# Patient Record
Sex: Female | Born: 1980 | Race: White | Hispanic: Yes | Smoking: Never smoker
Health system: Southern US, Community
[De-identification: ages and names within clinical notes are randomized; demographics above are authoritative.]

## PROBLEM LIST (undated history)

## (undated) DIAGNOSIS — O139 Gestational [pregnancy-induced] hypertension without significant proteinuria, unspecified trimester: Secondary | ICD-10-CM

## (undated) DIAGNOSIS — F32A Depression, unspecified: Secondary | ICD-10-CM

## (undated) DIAGNOSIS — I499 Cardiac arrhythmia, unspecified: Secondary | ICD-10-CM

## (undated) DIAGNOSIS — N390 Urinary tract infection, site not specified: Secondary | ICD-10-CM

## (undated) HISTORY — PX: NO PAST SURGERIES: SHX2092

## (undated) HISTORY — DX: Cardiac arrhythmia, unspecified: I49.9

---

## 2000-02-06 ENCOUNTER — Encounter: Payer: Self-pay | Admitting: Emergency Medicine

## 2000-02-06 ENCOUNTER — Emergency Department (HOSPITAL_COMMUNITY): Admission: EM | Admit: 2000-02-06 | Discharge: 2000-02-06 | Payer: Self-pay | Admitting: Emergency Medicine

## 2004-07-12 ENCOUNTER — Inpatient Hospital Stay (HOSPITAL_COMMUNITY): Admission: AD | Admit: 2004-07-12 | Discharge: 2004-07-12 | Payer: Self-pay | Admitting: Obstetrics

## 2004-10-09 ENCOUNTER — Inpatient Hospital Stay (HOSPITAL_COMMUNITY): Admission: AD | Admit: 2004-10-09 | Discharge: 2004-10-09 | Payer: Self-pay | Admitting: Obstetrics & Gynecology

## 2004-10-09 ENCOUNTER — Inpatient Hospital Stay (HOSPITAL_COMMUNITY): Admission: AD | Admit: 2004-10-09 | Discharge: 2004-10-11 | Payer: Self-pay | Admitting: Obstetrics & Gynecology

## 2006-04-27 ENCOUNTER — Ambulatory Visit: Payer: Self-pay | Admitting: Family Medicine

## 2006-04-29 ENCOUNTER — Ambulatory Visit: Payer: Self-pay | Admitting: *Deleted

## 2006-08-19 ENCOUNTER — Ambulatory Visit: Payer: Self-pay | Admitting: Family Medicine

## 2006-09-07 ENCOUNTER — Ambulatory Visit: Payer: Self-pay | Admitting: Family Medicine

## 2006-10-21 ENCOUNTER — Ambulatory Visit: Payer: Self-pay | Admitting: Internal Medicine

## 2006-11-04 ENCOUNTER — Ambulatory Visit: Payer: Self-pay | Admitting: Internal Medicine

## 2006-12-04 ENCOUNTER — Observation Stay (HOSPITAL_COMMUNITY): Admission: EM | Admit: 2006-12-04 | Discharge: 2006-12-06 | Payer: Self-pay | Admitting: Emergency Medicine

## 2006-12-06 ENCOUNTER — Ambulatory Visit: Payer: Self-pay | Admitting: Internal Medicine

## 2007-01-05 ENCOUNTER — Ambulatory Visit: Payer: Self-pay | Admitting: Internal Medicine

## 2007-04-20 ENCOUNTER — Ambulatory Visit: Payer: Self-pay | Admitting: Internal Medicine

## 2007-06-28 ENCOUNTER — Encounter (INDEPENDENT_AMBULATORY_CARE_PROVIDER_SITE_OTHER): Payer: Self-pay | Admitting: *Deleted

## 2007-08-03 ENCOUNTER — Inpatient Hospital Stay (HOSPITAL_COMMUNITY): Admission: AD | Admit: 2007-08-03 | Discharge: 2007-08-03 | Payer: Self-pay | Admitting: Family Medicine

## 2007-11-21 ENCOUNTER — Ambulatory Visit (HOSPITAL_COMMUNITY): Admission: RE | Admit: 2007-11-21 | Discharge: 2007-11-21 | Payer: Self-pay | Admitting: Family Medicine

## 2008-03-15 ENCOUNTER — Ambulatory Visit: Payer: Self-pay | Admitting: Obstetrics and Gynecology

## 2008-03-18 ENCOUNTER — Ambulatory Visit: Payer: Self-pay | Admitting: Obstetrics and Gynecology

## 2008-03-18 ENCOUNTER — Inpatient Hospital Stay (HOSPITAL_COMMUNITY): Admission: AD | Admit: 2008-03-18 | Discharge: 2008-03-20 | Payer: Self-pay | Admitting: Gynecology

## 2008-04-26 ENCOUNTER — Inpatient Hospital Stay (HOSPITAL_COMMUNITY): Admission: AD | Admit: 2008-04-26 | Discharge: 2008-04-26 | Payer: Self-pay | Admitting: Obstetrics & Gynecology

## 2011-02-26 NOTE — H&P (Signed)
NAMESHIFRA, SWARTZENTRUBER NO.:  [id number]   MEDICAL RECORD NO.:  [id number]          PATIENT TYPE:  EMS   LOCATION:  ED                           FACILITY:  [facility]   PHYSICIAN:  Madaline Savage, MD        DATE OF BIRTH:  1981/07/19   DATE OF PROCEDURE:  DATE OF DISCHARGE:                    STAT - MUST CHANGE TO CORRECT WORK TYPE   PRIMARY CARE PHYSICIAN:  Unassigned to [place].   CHIEF COMPLAINT:  Zoloft overdose.   HISTORY OF PRESENT ILLNESS:  Ms. Doy Mince is a 30 year old Spanish-speaking  lady who was brought to the ER after she took 15-20 tablets of 100 mg  Zoloft.  She was no speaking [place], and the history was obtained using  an interpreter phone.  She states that she has a history of depression,  and she was feeling very depressed and edgy today, and so she took  Zoloft tablet.  Since she was not feeling better after a couple of  minutes, she took another tablet.  This continued for awhile, and she  says in about 40 minutes she took 15-20 tablets of 100 mg of Zoloft.  She did not have any other complaints.  She denies being suicidal or  homicidal   When she came to the ER, her vital signs were stable, but at this point  in time, her heart rate has gone up to 110-120.  She also complains of  vague abdominal pain which started after coming to the ER.  She cannot  locate it to one place in the abdomen.  She denies having any nausea or  change in bowel habits.  She also denies chest pain, palpitations, or  breathing difficulty.   PAST MEDICAL HISTORY:  Depression. She does go to the psych doctor at  the health service.  No other medical problems.   PAST SURGICAL HISTORY:  None.   ALLERGIES:  None.   CURRENT MEDICATIONS:  Zoloft 100 mg once daily.   SOCIAL HISTORY:  She has two kids, ages 101 and 2.  She denies any  smoking, alcohol, or drug abuse.   FAMILY HISTORY:  Her father passed away.  He was a HIV positive.  She  states her mother is 32.  She is also HIV  positive, and she also has  diabetes.   REVIEW OF SYSTEMS:  GENERAL:  She denies any recent weight loss, weight  gain, fever, or chills.  HEENT: No headaches, no blurred vision.  CARDIOVASCULAR;  Denies chest pain, palpitations.  RESPIRATORY SYSTEM:  No shortness of breath or cough.  GI: Does complain of vague abdominal  pain but nausea, vomiting, diarrhea, or constipation.   PHYSICAL EXAMINATION:  GENERAL:  Alert and oriented x3.  VITAL SIGNS: Temperature 98.4, pulse rate of 83, blood pressure 118/43,  oxygen saturation 99% on room air.  HEENT:  Head is atraumatic and normocephalic.  Pupils equal, round, and  reactive to light.  Mucous membranes moist.  NECK:  Supple.  No JVD, no carotid bruit.  CARDIOVASCULAR:  S1, S2 heard.  Regular rate and rhythm.  No S3,  gallops.  CHEST:  Clear to auscultation.  ABDOMEN:  Soft.  Bowel sounds heard.  EXTREMITIES:  No edema, cyanosis, clubbing.   LABORATORY DATA:  Sodium 139, potassium 3.3, chloride 110, bicarb 24,  BUN 10, creatinine 0.68, glucose 127 . Acetaminophen level less than 10.  Salicylate level less than 4.  Urine drug screen is negative.   EKG shows sinus tachycardia at 120.   PROBLEM LIST:  1. Zoloft overdose.  2. Depression.  3. Tachycardia.  4. Hypokalemia.   PLAN:  We will admit her at 23-observation to a telemetry floor.  Tachycardia is one of the effects of a Zoloft overdose, but her heart  rate was low when she came.  We will watch her heart rate at this point  in time.  Her blood pressure is stable.  We will also replace the  potassium and will consult the psychiatrist on call in the morning.      Madaline Savage, MD  Electronically Signed     PKN/MEDQ  D:  12/04/2006  T:  12/04/2006  Job:  [id number]

## 2011-02-26 NOTE — Consult Note (Signed)
NAMESHIRLEYANN, MONTERO NO.:  [id number]   MEDICAL RECORD NO.:  [id number]          PATIENT TYPE:  OBV   LOCATION:  1424                         FACILITY:  [facility]   PHYSICIAN:  Antonietta Breach, M.D.  DATE OF BIRTH:  09/19/1981   DATE OF CONSULTATION:  12/05/2006  DATE OF DISCHARGE:  12/06/2006                                 CONSULTATION   REQUESTING PHYSICIAN:  Hettie Holstein, D.O.   REASON FOR CONSULTATION:  Overdose.   HISTORY:  Natalie Aguilar is a 30 year old female admitted to the  [facility] after an overdose of Zoloft.  She is Spanish-  speaking, and the interpreter phone system is utilized.  Approximately  three months ago she was experiencing depressed mood, decreased energy,  difficulty concentrating, as well as feeling on edge and excess worry.  She went to her primary care physician at the [facility], and  received a prescription of Zoloft for 50 mg daily.  She began to take  that, and over a number of weeks had a resolution of a number of her  symptoms.  When she went back for followup she stated that there was a  new physician; and her Zoloft medicine was increased to 100 mg daily.  She then began to have feelings of dizziness; and she took herself off  of the Zoloft.   Over the past week 1-1/2 weeks she redeveloped feeling on edge and  irritability as well as some depressed mood and excessive worry.  She  started to take her Zoloft, again, on the date of admission.  However,  she impulsively began to take more of it, hoping that it would acutely  reduce her symptoms.  It is estimated that she took 15-20 tablets of the  Zoloft; she is adamantly denied that she was trying to kill herself.  She continues with the same story that she was trying to reduce her  acute symptoms.  She states that when she called emergency services she  was not having any thoughts of killing herself nor was she having  thoughts of harming her kids.  She  did state that if she did not get  relief of her symptoms that she could and harming her children.   While on the medical floor Natalie Aguilar has been socially appropriate and  cooperative.  She is oriented to all spheres.  Her memory is intact.  She discusses normal interests and hope.  She does not have any thoughts  of harming herself or others.  She does not have any hallucinations or  delusions.   PAST PSYCHIATRIC HISTORY:  There is no history of mania, no history of  suicide attempts, no history of psychiatric hospitalizations, no prior  history of psychotropic medication.   FAMILY PSYCHIATRIC HISTORY:  None known.   SOCIAL HISTORY:  She is married.  She has two children ages 20 and 2.  She denies any alcohol or illegal drug use.   GENERAL MEDICAL PROBLEMS:  Status post Zoloft overdose.   MEDICATIONS:  No psychotropics.   ALLERGIES:  She has no known  drug allergies.   MENTAL STATUS EXAM:  Natalie Aguilar is an alert female who appears her  chronologic age of 30.  She displays a normal psychomotor tone.  She is  sitting up in her hospital bed with good eye contact and socially  appropriate.  She is oriented to all spheres.  Her speech is clearly  understood by the interpreter, and is coherent without dysarthria.  The  prosody is normal.  Thought process:  Logical, coherent, and goal-  directed, no looseness of associations.  Thought content:  No thoughts  of harming herself, no thoughts of harming others, no delusions, no  hallucinations.  Concentration is grossly within normal limits.  Her  insight appears to be fairly good.  Judgment is intact.  Fund of  knowledge and intelligence are estimated to be within normal limits.   ASSESSMENT:  AXIS I:  1. 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  2. 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  AXIS  II:  Deferred.  AXIS III:  See general medical problems.  AXIS IV:  Primary support group.  AXIS V:  55.   Natalie Aguilar was assessed to not be at risk to harm herself or others.  She agrees to use emergency services immediately for any thoughts of  harming herself, thoughts of harming others or distress.   RECOMMENDATIONS:  1. She will continue on Zoloft 50 mg daily for antidepression      antianxiety.  She has a follow up with her primary care physician.  2. Also would ask the case manager to expedite [facility]      [facility] psychiatric      consultation for her psychotropic medication as well as counseling.      Would ensure that she has a follow up appointment within the first      week of discharge.  3. The undersigned provided education on the treatment of anxiety and      depression.      Antonietta Breach, M.D.  Electronically Signed     JW/MEDQ  D:  12/06/2006  T:  12/06/2006  Job:  [id number]

## 2011-07-08 LAB — CBC
Hemoglobin: 8.3 — ABNORMAL LOW
MCV: 72.7 — ABNORMAL LOW
Platelets: 182
Platelets: 184
RDW: 15.7 — ABNORMAL HIGH
WBC: 11.3 — ABNORMAL HIGH
WBC: 9.3

## 2011-07-08 LAB — RPR: RPR Ser Ql: NONREACTIVE

## 2011-07-21 LAB — URINALYSIS, ROUTINE W REFLEX MICROSCOPIC
Hgb urine dipstick: NEGATIVE
Nitrite: NEGATIVE
Specific Gravity, Urine: 1.025
Urobilinogen, UA: 0.2
pH: 6.5

## 2011-07-21 LAB — POCT PREGNANCY, URINE
Operator id: 120561
Preg Test, Ur: POSITIVE

## 2011-07-27 ENCOUNTER — Emergency Department (HOSPITAL_COMMUNITY)
Admission: EM | Admit: 2011-07-27 | Discharge: 2011-07-27 | Disposition: A | Discharge status: Home / Self Care | Payer: Self-pay | Attending: Emergency Medicine | Admitting: Emergency Medicine

## 2011-07-27 DIAGNOSIS — N390 Urinary tract infection, site not specified: Secondary | ICD-10-CM | POA: Insufficient documentation

## 2011-07-27 DIAGNOSIS — N949 Unspecified condition associated with female genital organs and menstrual cycle: Secondary | ICD-10-CM | POA: Insufficient documentation

## 2011-07-27 DIAGNOSIS — N898 Other specified noninflammatory disorders of vagina: Secondary | ICD-10-CM | POA: Insufficient documentation

## 2011-07-27 DIAGNOSIS — R300 Dysuria: Secondary | ICD-10-CM | POA: Insufficient documentation

## 2011-07-27 LAB — URINE MICROSCOPIC-ADD ON

## 2011-07-27 LAB — POCT PREGNANCY, URINE: Preg Test, Ur: NEGATIVE

## 2011-07-27 LAB — URINALYSIS, ROUTINE W REFLEX MICROSCOPIC
Nitrite: NEGATIVE
Specific Gravity, Urine: 1.021 (ref 1.005–1.030)
pH: 7.5 (ref 5.0–8.0)

## 2011-07-28 LAB — GC/CHLAMYDIA PROBE AMP, GENITAL
Chlamydia, DNA Probe: NEGATIVE
GC Probe Amp, Genital: NEGATIVE

## 2013-07-24 ENCOUNTER — Encounter: Payer: Self-pay | Admitting: Internal Medicine

## 2013-07-24 ENCOUNTER — Ambulatory Visit: Payer: Self-pay | Attending: Internal Medicine | Admitting: Internal Medicine

## 2013-07-24 VITALS — BP 104/67 | HR 64 | Temp 99.2°F | Resp 16 | Ht 67.0 in | Wt 150.0 lb

## 2013-07-24 DIAGNOSIS — N390 Urinary tract infection, site not specified: Secondary | ICD-10-CM | POA: Insufficient documentation

## 2013-07-24 DIAGNOSIS — B3749 Other urogenital candidiasis: Secondary | ICD-10-CM

## 2013-07-24 DIAGNOSIS — Z0000 Encounter for general adult medical examination without abnormal findings: Secondary | ICD-10-CM

## 2013-07-24 LAB — POCT URINALYSIS DIPSTICK
Blood, UA: NEGATIVE
Glucose, UA: NEGATIVE
Nitrite, UA: NEGATIVE
Urobilinogen, UA: 0.2

## 2013-07-24 MED ORDER — METRONIDAZOLE 500 MG PO TABS: 500.0000 mg | ORAL_TABLET | Freq: Three times a day (TID) | ORAL | Status: DC

## 2013-07-24 MED ORDER — DOXYCYCLINE HYCLATE 100 MG PO TABS: 100.0000 mg | ORAL_TABLET | Freq: Two times a day (BID) | ORAL | Status: DC

## 2013-07-24 MED ORDER — SIMETHICONE 80 MG PO CHEW: 80.0000 mg | CHEWABLE_TABLET | Freq: Four times a day (QID) | ORAL | Status: DC | PRN

## 2013-07-24 NOTE — Progress Notes (Signed)
CC: New patient  HPI: 32 year old female with no significant past medical history who presented to clinic with complaints of burning sensation on urination for past couple of days, and frequency but no hematuria. Patient reports no fevers or chills. Patient reports this usually comes few days after sexual intercourse. She's using condoms with her partner. Patient also describes having whitish discharge. She used over-the-counter gel for yeast infection but with no significant improvement. No complaints of abdominal pain, nausea or vomiting.  No Known Allergies History reviewed. No pertinent past medical history. No current outpatient prescriptions on file prior to visit.   No current facility-administered medications on file prior to visit.   Family History  Problem Relation Age of Onset  . Diabetes Mother    History   Social History  . Marital Status: Single    Spouse Name: N/A    Number of Children: N/A  . Years of Education: N/A   Occupational History  . Not on file.   Social History Main Topics  . Smoking status: Never Smoker   . Smokeless tobacco: Not on file  . Alcohol Use: No  . Drug Use: No  . Sexual Activity: Not on file   Other Topics Concern  . Not on file   Social History Narrative  . No narrative on file    Review of Systems  Constitutional: Negative for fever, chills, diaphoresis, activity change, appetite change and fatigue.  HENT: Negative for ear pain, nosebleeds, congestion, facial swelling, rhinorrhea, neck pain, neck stiffness and ear discharge.   Eyes: Negative for pain, discharge, redness, itching and visual disturbance.  Respiratory: Negative for cough, choking, chest tightness, shortness of breath, wheezing and stridor.   Cardiovascular: Negative for chest pain, palpitations and leg swelling.  Gastrointestinal: Negative for abdominal distention.  Genitourinary: Per history of present illness Musculoskeletal: Negative for back pain, joint  swelling, arthralgias and gait problem.  Neurological: Negative for dizziness, tremors, seizures, syncope, facial asymmetry, speech difficulty, weakness, light-headedness, numbness and headaches.  Hematological: Negative for adenopathy. Does not bruise/bleed easily.  Psychiatric/Behavioral: Negative for hallucinations, behavioral problems, confusion, dysphoric mood, decreased concentration and agitation.    Objective:   Filed Vitals:   07/24/13 0931  BP: 104/67  Pulse: 64  Temp: 99.2 F (37.3 C)  Resp: 16    Physical Exam  Constitutional: Appears well-developed and well-nourished. No distress.  HENT: Normocephalic. External right and left ear normal. Oropharynx is clear and moist.  Eyes: Conjunctivae and EOM are normal. PERRLA, no scleral icterus.  Neck: Normal ROM. Neck supple. No JVD. No tracheal deviation. No thyromegaly.  CVS: RRR, S1/S2 +, no murmurs, no gallops, no carotid bruit.  Pulmonary: Effort and breath sounds normal, no stridor, rhonchi, wheezes, rales.  Abdominal: Soft. BS +,  no distension, tenderness, rebound or guarding.  Musculoskeletal: Normal range of motion. No edema and no tenderness.  Lymphadenopathy: No lymphadenopathy noted, cervical, inguinal. Neuro: Alert. Normal reflexes, muscle tone coordination. No cranial nerve deficit. Skin: Skin is warm and dry. No rash noted. Not diaphoretic. No erythema. No pallor.  Psychiatric: Normal mood and affect. Behavior, judgment, thought content normal.   Lab Results  Component Value Date   WBC 11.3* 03/19/2008   HGB 8.3 DELTA CHECK NOTED* 03/19/2008   HCT 24.4* 03/19/2008   MCV 73.2* 03/19/2008   PLT 182 03/19/2008   No results found for this basename: CREATININE, BUN, NA, K, CL, CO2    No results found for this basename: HGBA1C   Lipid Panel  No  results found for this basename: chol, trig, hdl, cholhdl, vldl, ldlcalc       Assessment and plan:   Patient Active Problem List   Diagnosis Date Noted  . UTI (urinary  tract infection) 07/24/2013    Priority: High - Check urinalysis  - Prescription provided for Flagyl for 7 days and doxycycline for 7 days   . Preventative health care 07/24/2013    Priority: Medium - Referral to GYN provided

## 2013-07-24 NOTE — Progress Notes (Signed)
Pt is here to establish care. Pt reports having vaginitis since January. She states that she had a dark yellow discharge she took some over the counter medication and the discharge became white and milky. She said that during sex her vagina get really red. She also states that after meals she has a lot of gas.

## 2013-07-24 NOTE — Patient Instructions (Signed)
Urinary Tract Infection  Urinary tract infections (UTIs) can develop anywhere along your urinary tract. Your urinary tract is your body's drainage system for removing wastes and extra water. Your urinary tract includes two kidneys, two ureters, a bladder, and a urethra. Your kidneys are a pair of bean-shaped organs. Each kidney is about the size of your fist. They are located below your ribs, one on each side of your spine.  CAUSES  Infections are caused by microbes, which are microscopic organisms, including fungi, viruses, and bacteria. These organisms are so small that they can only be seen through a microscope. Bacteria are the microbes that most commonly cause UTIs.  SYMPTOMS   Symptoms of UTIs may vary by age and gender of the patient and by the location of the infection. Symptoms in young women typically include a frequent and intense urge to urinate and a painful, burning feeling in the bladder or urethra during urination. Older women and men are more likely to be tired, shaky, and weak and have muscle aches and abdominal pain. A fever may mean the infection is in your kidneys. Other symptoms of a kidney infection include pain in your back or sides below the ribs, nausea, and vomiting.  DIAGNOSIS  To diagnose a UTI, your caregiver will ask you about your symptoms. Your caregiver also will ask to provide a urine sample. The urine sample will be tested for bacteria and white blood cells. White blood cells are made by your body to help fight infection.  TREATMENT   Typically, UTIs can be treated with medication. Because most UTIs are caused by a bacterial infection, they usually can be treated with the use of antibiotics. The choice of antibiotic and length of treatment depend on your symptoms and the type of bacteria causing your infection.  HOME CARE INSTRUCTIONS   If you were prescribed antibiotics, take them exactly as your caregiver instructs you. Finish the medication even if you feel better after you  have only taken some of the medication.   Drink enough water and fluids to keep your urine clear or pale yellow.   Avoid caffeine, tea, and carbonated beverages. They tend to irritate your bladder.   Empty your bladder often. Avoid holding urine for long periods of time.   Empty your bladder before and after sexual intercourse.   After a bowel movement, women should cleanse from front to back. Use each tissue only once.  SEEK MEDICAL CARE IF:    You have back pain.   You develop a fever.   Your symptoms do not begin to resolve within 3 days.  SEEK IMMEDIATE MEDICAL CARE IF:    You have severe back pain or lower abdominal pain.   You develop chills.   You have nausea or vomiting.   You have continued burning or discomfort with urination.  MAKE SURE YOU:    Understand these instructions.   Will watch your condition.   Will get help right away if you are not doing well or get worse.  Document Released: 07/07/2005 Document Revised: 03/28/2012 Document Reviewed: 11/05/2011  Exit[employer] Patient Information 2014 [employer].

## 2013-08-24 ENCOUNTER — Ambulatory Visit: Payer: Self-pay

## 2013-11-07 ENCOUNTER — Ambulatory Visit: Payer: Self-pay | Attending: Internal Medicine | Admitting: Internal Medicine

## 2013-11-07 ENCOUNTER — Encounter: Payer: Self-pay | Admitting: Internal Medicine

## 2013-11-07 VITALS — BP 107/68 | HR 64 | Temp 99.2°F | Resp 16 | Ht 67.0 in | Wt 150.0 lb

## 2013-11-07 DIAGNOSIS — N760 Acute vaginitis: Secondary | ICD-10-CM

## 2013-11-07 LAB — CBC WITH DIFFERENTIAL/PLATELET
BASOS ABS: 0 10*3/uL (ref 0.0–0.1)
BASOS PCT: 0 % (ref 0–1)
Eosinophils Absolute: 0.1 10*3/uL (ref 0.0–0.7)
Eosinophils Relative: 2 % (ref 0–5)
HEMATOCRIT: 35.6 % — AB (ref 36.0–46.0)
Hemoglobin: 11.5 g/dL — ABNORMAL LOW (ref 12.0–15.0)
LYMPHS PCT: 33 % (ref 12–46)
Lymphs Abs: 2.6 10*3/uL (ref 0.7–4.0)
MCH: 23.9 pg — ABNORMAL LOW (ref 26.0–34.0)
MCHC: 32.3 g/dL (ref 30.0–36.0)
MCV: 73.9 fL — ABNORMAL LOW (ref 78.0–100.0)
Monocytes Absolute: 0.5 10*3/uL (ref 0.1–1.0)
Monocytes Relative: 6 % (ref 3–12)
NEUTROS ABS: 4.7 10*3/uL (ref 1.7–7.7)
NEUTROS PCT: 59 % (ref 43–77)
Platelets: 246 10*3/uL (ref 150–400)
RBC: 4.82 MIL/uL (ref 3.87–5.11)
RDW: 15.9 % — AB (ref 11.5–15.5)
WBC: 8 10*3/uL (ref 4.0–10.5)

## 2013-11-07 LAB — POCT URINE PREGNANCY: Preg Test, Ur: NEGATIVE

## 2013-11-07 LAB — POCT URINALYSIS DIPSTICK
BILIRUBIN UA: NEGATIVE
Blood, UA: NEGATIVE
Glucose, UA: NEGATIVE
KETONES UA: NEGATIVE
LEUKOCYTES UA: NEGATIVE
NITRITE UA: NEGATIVE
PH UA: 6
PROTEIN UA: NEGATIVE
Spec Grav, UA: >=1.03
Urobilinogen, UA: 0.2

## 2013-11-07 MED ORDER — ACYCLOVIR 400 MG PO TABS: 400.0000 mg | ORAL_TABLET | Freq: Every day | ORAL | Status: DC

## 2013-11-07 MED ORDER — CEFTRIAXONE SODIUM 250 MG IJ SOLR: 250.0000 mg | Freq: Once | INTRAMUSCULAR | Status: DC

## 2013-11-07 MED ORDER — DOXYCYCLINE HYCLATE 100 MG PO TABS: 100.0000 mg | ORAL_TABLET | Freq: Two times a day (BID) | ORAL | Status: DC

## 2013-11-07 MED ORDER — CEFTRIAXONE SODIUM 1 G IJ SOLR
250.0000 mg | Freq: Once | INTRAMUSCULAR | Status: AC
  Administered 2013-11-07: 250 mg via INTRAMUSCULAR

## 2013-11-07 MED ORDER — METRONIDAZOLE 500 MG PO TABS: 500.0000 mg | ORAL_TABLET | Freq: Three times a day (TID) | ORAL | Status: DC

## 2013-11-07 NOTE — Progress Notes (Signed)
Pt is here following up on her UTI  Pt reports that the symptoms has returned. She alos has noticed that she has some new growths around her vagina.

## 2013-11-07 NOTE — Progress Notes (Signed)
Patient ID: Natalie Aguilar, female   DOB: 09/17/1981, 33 y.o.   MRN: [id number]   CC:  HPI: 33 year old female presents with a history of vaginal itching, vaginal redness, bumps in her vagina, recurrent outbreaks of vesicular lesions since December 2013. Symptoms go away when the patient is using barrier protection. Her most recent chlamydia/gonorrhea PCR was negative in 2012. Patient's husband has never had any evaluation for sexually transmitted diseases. She also complains of clear liquid discharge from her vagina. The patient claims that she's not had a Pap smear in several years.   No Known Allergies History reviewed. No pertinent past medical history. Current Outpatient Prescriptions on File Prior to Visit  Medication Sig Dispense Refill  . simethicone (GAS-X) 80 MG chewable tablet Chew 1 tablet (80 mg total) by mouth every 6 (six) hours as needed for flatulence.  30 tablet  0   No current facility-administered medications on file prior to visit.   Family History  Problem Relation Age of Onset  . Diabetes Mother    History   Social History  . Marital Status: Single    Spouse Name: N/A    Number of Children: N/A  . Years of Education: N/A   Occupational History  . Not on file.   Social History Main Topics  . Smoking status: Never Smoker   . Smokeless tobacco: Not on file  . Alcohol Use: No  . Drug Use: No  . Sexual Activity: Not on file   Other Topics Concern  . Not on file   Social History Narrative  . No narrative on file    Review of Systems  Constitutional: Negative for fever, chills, diaphoresis, activity change, appetite change and fatigue.  HENT: Negative for ear pain, nosebleeds, congestion, facial swelling, rhinorrhea, neck pain, neck stiffness and ear discharge.   Eyes: Negative for pain, discharge, redness, itching and visual disturbance.  Respiratory: Negative for cough, choking, chest tightness, shortness of breath, wheezing and stridor.    Cardiovascular: Negative for chest pain, palpitations and leg swelling.  Gastrointestinal: Negative for abdominal distention.  Genitourinary: Positive for dysuria, urgency, frequency, hematuria, flank pain, decreased urine volume, lesions in her vagina and dyspareunia.  Musculoskeletal: Negative for back pain, joint swelling, arthralgias and gait problem.  Neurological: Negative for dizziness, tremors, seizures, syncope, facial asymmetry, speech difficulty, weakness, light-headedness, numbness and headaches.  Hematological: Negative for adenopathy. Does not bruise/bleed easily.  Psychiatric/Behavioral: Negative for hallucinations, behavioral problems, confusion, dysphoric mood, decreased concentration and agitation.    Objective:   Filed Vitals:   11/07/13 1631  BP: 107/68  Pulse: 64  Temp: 99.2 F (37.3 C)  Resp: 16    Physical Exam  Constitutional: Appears well-developed and well-nourished. No distress.  HENT: Normocephalic. External right and left ear normal. Oropharynx is clear and moist.  Eyes: Conjunctivae and EOM are normal. PERRLA, no scleral icterus.  Neck: Normal ROM. Neck supple. No JVD. No tracheal deviation. No thyromegaly.  CVS: RRR, S1/S2 +, no murmurs, no gallops, no carotid bruit.  Pulmonary: Effort and breath sounds normal, no stridor, rhonchi, wheezes, rales.  Abdominal: Soft. BS +,  no distension, tenderness, rebound or guarding.  Musculoskeletal: Normal range of motion. No edema and no tenderness.  Lymphadenopathy: No lymphadenopathy noted, cervical, inguinal. Neuro: Alert. Normal reflexes, muscle tone coordination. No cranial nerve deficit. Skin: Skin is warm and dry. No rash noted. Not diaphoretic. No erythema. No pallor.  Psychiatric: Normal mood and affect. Behavior, judgment, thought content normal.   Lab Results  Component Value Date   WBC 11.3* 03/19/2008   HGB 8.3 DELTA CHECK NOTED* 03/19/2008   HCT 24.4* 03/19/2008   MCV 73.2* 03/19/2008   PLT 182  03/19/2008   No results found for this basename: CREATININE, BUN, NA, K, CL, CO2    No results found for this basename: HGBA1C   Lipid Panel  No results found for this basename: chol, trig, hdl, cholhdl, vldl, ldlcalc       Assessment and plan:   Patient Active Problem List   Diagnosis Date Noted  . UTI (urinary tract infection) 07/24/2013  . Preventative health care 07/24/2013   Vaginitis We'll check for chlamydia/gonorrhea Urine dipstick, pregnancy test Empirically treat with Rocephin 250 mg IM times one Doxycycline 100 twice a day for 7 days Flagyl 500 twice a day for 7 days Acyclovir 405 times a day for 7 days Because of "growths"she claimed she had her vagina the patient needs a full pelvic exam and a Pap smear, gynecology referral provided Followup in 2 months       The patient was given clear instructions to go to ER or return to medical center if symptoms don't improve, worsen or new problems develop. The patient verbalized understanding. The patient was told to call to get any lab results if not heard anything in the next week.

## 2013-11-08 LAB — COMPLETE METABOLIC PANEL WITH GFR
ALBUMIN: 4.3 g/dL (ref 3.5–5.2)
ALK PHOS: 41 U/L (ref 39–117)
ALT: 14 U/L (ref 0–35)
AST: 17 U/L (ref 0–37)
BUN: 16 mg/dL (ref 6–23)
CALCIUM: 9.2 mg/dL (ref 8.4–10.5)
CHLORIDE: 103 meq/L (ref 96–112)
CO2: 26 mEq/L (ref 19–32)
Creat: 0.61 mg/dL (ref 0.50–1.10)
GFR, Est African American: >89 mL/min
GFR, Est Non African American: >89 mL/min
Glucose, Bld: 100 mg/dL — ABNORMAL HIGH (ref 70–99)
POTASSIUM: 4.3 meq/L (ref 3.5–5.3)
SODIUM: 137 meq/L (ref 135–145)
TOTAL PROTEIN: 7.1 g/dL (ref 6.0–8.3)
Total Bilirubin: 0.5 mg/dL (ref 0.2–1.2)

## 2013-11-08 LAB — GC/CHLAMYDIA PROBE AMP, URINE
Chlamydia, Swab/Urine, PCR: NEGATIVE
GC PROBE AMP, URINE: NEGATIVE

## 2013-11-09 ENCOUNTER — Telehealth: Payer: Self-pay | Admitting: *Deleted

## 2013-11-09 NOTE — Telephone Encounter (Signed)
Message copied by Elvina Bosch, [place] R on Fri Nov 09, 2013 11:19 AM ------      Message from: Allyson Sabal MD, [facility]      Created: Thu Nov 08, 2013  9:24 AM       Is in the Chlamydia/gonorrhea negative, she does not need to take doxycycline. She is also anemic was likely secondary to iron deficiency. Please call in a prescription for ferrous sulfate 325 mg twice a day, 60 tablets with 5 refills ------

## 2013-11-09 NOTE — Telephone Encounter (Signed)
Left a voicemail for pt to give us a call back. 

## 2013-11-13 NOTE — Telephone Encounter (Signed)
Pt returning call, only speaks spanish. Please f/u with pt.

## 2013-11-14 ENCOUNTER — Telehealth: Payer: Self-pay | Admitting: Internal Medicine

## 2013-11-14 NOTE — Telephone Encounter (Signed)
Pt. Is waiting on a returned phone called in regards to her results. Please call pt. With spanish interpreter

## 2013-11-15 ENCOUNTER — Telehealth: Payer: Self-pay | Admitting: Emergency Medicine

## 2013-11-15 MED ORDER — FERROUS SULFATE 325 (65 FE) MG PO TABS: 325.0000 mg | ORAL_TABLET | Freq: Every day | ORAL | Status: DC

## 2013-11-15 NOTE — Telephone Encounter (Signed)
Pt given lab /culture result per [employer]. Pt verbalized understanding. Pt also informed she may pick medication  Ferrous sulfate at [place]

## 2013-11-28 ENCOUNTER — Encounter: Payer: Self-pay | Admitting: Obstetrics & Gynecology

## 2013-11-29 ENCOUNTER — Encounter: Payer: Self-pay | Admitting: Obstetrics and Gynecology

## 2013-12-11 NOTE — Telephone Encounter (Signed)
Given by Sharee Pimple on 11/15/2013

## 2014-01-03 ENCOUNTER — Ambulatory Visit: Payer: Self-pay | Admitting: Internal Medicine

## 2014-01-07 ENCOUNTER — Encounter: Payer: Self-pay | Admitting: Obstetrics & Gynecology

## 2014-06-03 ENCOUNTER — Emergency Department (HOSPITAL_BASED_OUTPATIENT_CLINIC_OR_DEPARTMENT_OTHER)
Admission: EM | Admit: 2014-06-03 | Discharge: 2014-06-03 | Disposition: A | Discharge status: Home / Self Care | Payer: Self-pay | Attending: Emergency Medicine | Admitting: Emergency Medicine

## 2014-06-03 ENCOUNTER — Emergency Department (HOSPITAL_BASED_OUTPATIENT_CLINIC_OR_DEPARTMENT_OTHER): Payer: Self-pay

## 2014-06-03 ENCOUNTER — Encounter (HOSPITAL_BASED_OUTPATIENT_CLINIC_OR_DEPARTMENT_OTHER): Payer: Self-pay | Admitting: Emergency Medicine

## 2014-06-03 DIAGNOSIS — Y9229 Other specified public building as the place of occurrence of the external cause: Secondary | ICD-10-CM | POA: Insufficient documentation

## 2014-06-03 DIAGNOSIS — IMO0002 Reserved for concepts with insufficient information to code with codable children: Secondary | ICD-10-CM

## 2014-06-03 DIAGNOSIS — W268XXA Contact with other sharp object(s), not elsewhere classified, initial encounter: Secondary | ICD-10-CM | POA: Insufficient documentation

## 2014-06-03 DIAGNOSIS — S41109A Unspecified open wound of unspecified upper arm, initial encounter: Secondary | ICD-10-CM | POA: Insufficient documentation

## 2014-06-03 DIAGNOSIS — Z79899 Other long term (current) drug therapy: Secondary | ICD-10-CM | POA: Insufficient documentation

## 2014-06-03 DIAGNOSIS — Y9389 Activity, other specified: Secondary | ICD-10-CM | POA: Insufficient documentation

## 2014-06-03 DIAGNOSIS — Z792 Long term (current) use of antibiotics: Secondary | ICD-10-CM | POA: Insufficient documentation

## 2014-06-03 NOTE — Discharge Instructions (Signed)
Cuidados de una laceracin - Adultos  (Laceration Care, Adult)  Ardelia Mems laceracin es un corte que atraviesa todas las capas de la piel. El corte llega hasta las capas inferiores de la piel.  CUIDADOS EN EL HOGAR  Si tiene puntos (suturas) o grapas:   Mantenga el corte limpio y seco.  Si tiene un (vendaje) cmbielo al menos una vez al da. Cmbielo si se moja o se ensucia, o segn las indicaciones del [place] veces por da con agua y [place]. Enjugelo con agua. Seque dando palmaditas con un pao limpio y seco.  Aplique una capa delgada de crema con medicamento sobre el corte, segn las indicaciones del mdico.  Puede ducharse despus de las primeras 24 horas. No remoje la herida en agua hasta que le hayan quitado los puntos.  Tome slo los medicamentos que le haya indicado el mdico.  Concurra para que le retiren los puntos cuando el mdico le indique. En caso que tenga tiras [id number]:   Mantenga la herida limpia y seca.  No deje que las tiras se mojen. Puede tomar un bao, pero tenga cuidado de no mojar el corte.  Si se moja, squelo dando palmaditas con una toalla limpia.  Las tiras caern por s mismas. No quite las tiras que estn pegadas a la herida. En caso que le hayan [place].   Puede ducharse o tomar un bao de inmersin. No frote ni sumerja la herida. Nopractique natacin. Evite transpirar mucho hasta que el [place] desaparezca. Despus de ducharse o darse un bao, seque el corte dando palmaditas con una toalla limpia.  No aplique medicamentos ni maquillaje hasta que el [place] caiga.  Si tiene un vendaje, No peque cinta adhesiva sobre el [employer]  Evite la luz solar o las lmparas para bronceado hasta que el [place] desaparezca. Aplique pantalla solar sobre el corte durante Tax inspector, para reducir la Radio broadcast assistant.  El [place] caer por s mismo. No quite el pegamento. Deber aplicarse la vacuna contra el ttanos si:  No recuerda cundo  se coloc la vacuna la ltima vez.  Nunca recibi esta vacuna. Si usted necesita aplicarse la vacuna y se niega a recibirla, corre riesgo de contraer ttanos. sta es una enfermedad grave.  SOLICITE AYUDA DE INMEDIATO SI:   El dolor no mejora con los medicamentos prescriptos.  Pierde la sensibilidad (adormecimiento) en el brazo, la mano, la pierna o el pie u observa cambios en el color.  El corte sangra.  Siente debilidad en la articulacin o no puede usarla.  Tiene bultos que le duelen en el cuerpo.  La zona del corte est roja, le duele o esthinchada.  Hay una lnea roja en la piel, cerca del corte.  Observa un lquido blanco amarillento (pus) en la herida.  Tiene fiebre.  Advierte un olor ftido que proviene de la herida o del vendaje.  La herida se abre antes o despus de que le [employer].  Nota que en la herida hay algn cuerpo extrao como un trozo de [place] o vidrio.  No puede mover los dedos. ASEGRESE DE QUE:   Comprende estas instrucciones.  Controlar su enfermedad.  Solicitar ayuda de inmediato si no mejora o si empeora. Document Released: 05/26/2011 Document Revised: 12/20/2011 [facility] Patient Information 2015 [place]. This information is not intended to replace advice given to you by your health care provider. Make sure you discuss any questions you have with your health care provider.

## 2014-06-03 NOTE — ED Notes (Addendum)
Laceration to left upper arm.  Sustained injury from a lamp while shopping in a thrift store.  Vomited enroute to ED; Zofran 4mg  IVP administered by EMS.

## 2014-06-03 NOTE — ED Provider Notes (Signed)
CSN: [id number]     Arrival date & time 06/03/14  1259 History   First MD Initiated Contact with Patient 06/03/14 1306     Chief Complaint  Patient presents with  . Extremity Laceration     (Consider location/radiation/quality/duration/timing/severity/associated sxs/prior Treatment) HPI Comments: Pt states that she was pulling a lamp off the shelf and she cut her left arm. Pt states that her tetanus is utd. Denies numbness or weakness in the arm. Pt had vomiting and near syncope at the store. ems brought pt in.   The history is provided by the patient. No language interpreter was used.    History reviewed. No pertinent past medical history. History reviewed. No pertinent past surgical history. Family History  Problem Relation Age of Onset  . Diabetes Mother    History  Substance Use Topics  . Smoking status: Never Smoker   . Smokeless tobacco: Not on file  . Alcohol Use: No   OB History   Grav Para Term Preterm Abortions TAB SAB Ect Mult Living                 Review of Systems  Constitutional: Negative.   Respiratory: Negative.   Cardiovascular: Negative.       Allergies  Review of patient's allergies indicates no known allergies.  Home Medications   Prior to Admission medications   Medication Sig Start Date End Date Taking? Authorizing Provider  acyclovir (ZOVIRAX) 400 MG tablet Take 1 tablet (400 mg total) by mouth 5 (five) times daily. 11/07/13   Reyne Dumas, MD  cefTRIAXone (ROCEPHIN) 250 MG injection Inject 250 mg into the muscle once.  FOR IM use in LARGE MUSCLE MASS 11/07/13   Reyne Dumas, MD  doxycycline (VIBRA-TABS) 100 MG tablet Take 1 tablet (100 mg total) by mouth 2 (two) times daily. 11/07/13   Reyne Dumas, MD  ferrous sulfate 325 (65 FE) MG tablet Take 1 tablet (325 mg total) by mouth daily with breakfast. 11/15/13   Reyne Dumas, MD  metroNIDAZOLE (FLAGYL) 500 MG tablet Take 1 tablet (500 mg total) by mouth 3 (three) times daily. 11/07/13   Reyne Dumas, MD  simethicone (GAS-X) 80 MG chewable tablet Chew 1 tablet (80 mg total) by mouth every 6 (six) hours as needed for flatulence. 07/24/13   Robbie Lis, MD   BP 113/48  Pulse 62  Temp(Src) 98.3 F (36.8 C) (Oral)  Resp 18  Wt 150 lb (68.04 kg)  SpO2 100%  LMP 05/20/2014 Physical Exam  Nursing note and vitals reviewed. Constitutional: She appears well-developed and well-nourished.  Cardiovascular: Normal rate and regular rhythm.   Pulmonary/Chest: Effort normal and breath sounds normal.  Musculoskeletal: Normal range of motion.  Neurological: She is alert.  Skin: Skin is warm and dry.  Laceration noted to the left upper arm. Pt has full rom. No fb noted  Psychiatric: She has a normal mood and affect.    ED Course  LACERATION REPAIR Date/Time: 06/03/2014 2:19 PM Performed by: Glendell Docker Authorized by: Glendell Docker Consent: Verbal consent obtained. Risks and benefits: risks, benefits and alternatives were discussed Consent given by: patient Patient identity confirmed: verbally with patient Body area: upper extremity Location details: left upper arm Laceration length: 5 cm Foreign bodies: no foreign bodies Anesthesia: local infiltration Local anesthetic: lidocaine 2% without epinephrine Irrigation solution: saline Irrigation method: syringe Amount of cleaning: standard Skin closure: 4-0 Prolene Number of sutures: 7 Technique: simple Approximation: close Approximation difficulty: simple Patient tolerance: Patient tolerated the  procedure well with no immediate complications.   (including critical care time) Labs Review Labs Reviewed - No data to display  Imaging Review Dg Humerus Left  06/03/2014   CLINICAL DATA:  Laceration.  EXAM: LEFT HUMERUS - 2+ VIEW  COMPARISON:  None.  FINDINGS: The shoulder and elbow joints are maintained. No acute fracture or unexpected radiopaque foreign body. There is artifact overlying the upper humeral area from the  patient's bra.  IMPRESSION: No acute fracture or radiopaque foreign body.   Electronically Signed   By: Kalman Jewels M.D.   On: 06/03/2014 13:57     EKG Interpretation None      MDM   Final diagnoses:  Laceration    Wound closed without any problem. No fb noted. No muscle involvement noted   Glendell Docker, NP 06/03/14 1422

## 2014-06-03 NOTE — ED Notes (Signed)
MD at bedside. 

## 2014-06-04 NOTE — ED Provider Notes (Signed)
Medical screening examination/treatment/procedure(s) were performed by non-physician practitioner and as supervising physician I was immediately available for consultation/collaboration.     Veryl Speak, MD 06/04/14 [phone]

## 2014-06-14 ENCOUNTER — Encounter (HOSPITAL_COMMUNITY): Payer: Self-pay | Admitting: Emergency Medicine

## 2014-06-14 ENCOUNTER — Emergency Department (INDEPENDENT_AMBULATORY_CARE_PROVIDER_SITE_OTHER)
Admission: EM | Admit: 2014-06-14 | Discharge: 2014-06-14 | Disposition: A | Discharge status: Home / Self Care | Payer: Self-pay | Source: Home / Self Care | Attending: Family Medicine | Admitting: Family Medicine

## 2014-06-14 DIAGNOSIS — Z4802 Encounter for removal of sutures: Secondary | ICD-10-CM

## 2014-06-14 NOTE — ED Provider Notes (Signed)
CSN: [id number]     Arrival date & time 06/14/14  1610 History   First MD Initiated Contact with Patient 06/14/14 1615     Chief Complaint  Patient presents with  . Wound Check   (Consider location/radiation/quality/duration/timing/severity/associated sxs/prior Treatment) Patient is a 33 y.o. female presenting with suture removal. The history is provided by the patient. The history is limited by a language barrier.  Suture / Staple Removal This is a new problem. The current episode started more than 1 week ago (seen 8/24 for lac to left upper arm, well healed, no problems). The problem has been gradually improving.    History reviewed. No pertinent past medical history. History reviewed. No pertinent past surgical history. Family History  Problem Relation Age of Onset  . Diabetes Mother    History  Substance Use Topics  . Smoking status: Never Smoker   . Smokeless tobacco: Not on file  . Alcohol Use: No   OB History   Grav Para Term Preterm Abortions TAB SAB Ect Mult Living                 Review of Systems  Constitutional: Negative.   Skin: Positive for wound.    Allergies  Review of patient's allergies indicates no known allergies.  Home Medications   Prior to Admission medications   Medication Sig Start Date End Date Taking? Authorizing Provider  acyclovir (ZOVIRAX) 400 MG tablet Take 1 tablet (400 mg total) by mouth 5 (five) times daily. 11/07/13   Reyne Dumas, MD  cefTRIAXone (ROCEPHIN) 250 MG injection Inject 250 mg into the muscle once.  FOR IM use in LARGE MUSCLE MASS 11/07/13   Reyne Dumas, MD  doxycycline (VIBRA-TABS) 100 MG tablet Take 1 tablet (100 mg total) by mouth 2 (two) times daily. 11/07/13   Reyne Dumas, MD  ferrous sulfate 325 (65 FE) MG tablet Take 1 tablet (325 mg total) by mouth daily with breakfast. 11/15/13   Reyne Dumas, MD  metroNIDAZOLE (FLAGYL) 500 MG tablet Take 1 tablet (500 mg total) by mouth 3 (three) times daily. 11/07/13   Reyne Dumas, MD   simethicone (GAS-X) 80 MG chewable tablet Chew 1 tablet (80 mg total) by mouth every 6 (six) hours as needed for flatulence. 07/24/13   Robbie Lis, MD   BP 121/61  Pulse 63  Temp(Src) 98.5 F (36.9 C) (Oral)  SpO2 100%  LMP 05/20/2014 Physical Exam  Nursing note and vitals reviewed. Constitutional: She is oriented to person, place, and time. She appears well-developed and well-nourished.  Neurological: She is alert and oriented to person, place, and time.  Skin: Skin is warm and dry.  Lac well healed, sutures removed.    ED Course  Procedures (including critical care time) Labs Review Labs Reviewed - No data to display  Imaging Review No results found.   MDM   1. Encounter for removal of sutures        Billy Fischer, MD 06/14/14 1630

## 2014-06-14 NOTE — Discharge Instructions (Signed)
Return as needed, no further care needed.

## 2014-06-14 NOTE — ED Notes (Signed)
Here for wound check and suture removal

## 2015-06-05 ENCOUNTER — Encounter (HOSPITAL_COMMUNITY): Payer: Self-pay | Admitting: Family Medicine

## 2015-06-05 ENCOUNTER — Emergency Department (HOSPITAL_COMMUNITY)
Admission: EM | Admit: 2015-06-05 | Discharge: 2015-06-05 | Disposition: A | Discharge status: Home / Self Care | Payer: Self-pay | Attending: Emergency Medicine | Admitting: Emergency Medicine

## 2015-06-05 DIAGNOSIS — Z79899 Other long term (current) drug therapy: Secondary | ICD-10-CM | POA: Insufficient documentation

## 2015-06-05 DIAGNOSIS — M62830 Muscle spasm of back: Secondary | ICD-10-CM | POA: Insufficient documentation

## 2015-06-05 DIAGNOSIS — Z3202 Encounter for pregnancy test, result negative: Secondary | ICD-10-CM | POA: Insufficient documentation

## 2015-06-05 DIAGNOSIS — Z792 Long term (current) use of antibiotics: Secondary | ICD-10-CM | POA: Insufficient documentation

## 2015-06-05 LAB — URINALYSIS, ROUTINE W REFLEX MICROSCOPIC
Bilirubin Urine: NEGATIVE
Glucose, UA: NEGATIVE mg/dL
HGB URINE DIPSTICK: NEGATIVE
Ketones, ur: NEGATIVE mg/dL
Nitrite: NEGATIVE
PROTEIN: NEGATIVE mg/dL
Specific Gravity, Urine: 1.025 (ref 1.005–1.030)
UROBILINOGEN UA: 0.2 mg/dL (ref 0.0–1.0)
pH: 6.5 (ref 5.0–8.0)

## 2015-06-05 LAB — POC URINE PREG, ED: PREG TEST UR: NEGATIVE

## 2015-06-05 LAB — URINE MICROSCOPIC-ADD ON

## 2015-06-05 MED ORDER — NAPROXEN 250 MG PO TABS: 250.0000 mg | ORAL_TABLET | Freq: Two times a day (BID) | ORAL | Status: DC

## 2015-06-05 MED ORDER — KETOROLAC TROMETHAMINE 30 MG/ML IJ SOLN
30.0000 mg | Freq: Once | INTRAMUSCULAR | Status: AC
  Administered 2015-06-05: 30 mg via INTRAMUSCULAR
  Filled 2015-06-05: qty 1

## 2015-06-05 MED ORDER — METHOCARBAMOL 500 MG PO TABS
500.0000 mg | ORAL_TABLET | Freq: Once | ORAL | Status: AC
  Administered 2015-06-05: 500 mg via ORAL
  Filled 2015-06-05: qty 1

## 2015-06-05 MED ORDER — METHOCARBAMOL 500 MG PO TABS: 500.0000 mg | ORAL_TABLET | Freq: Two times a day (BID) | ORAL | Status: DC | PRN

## 2015-06-05 NOTE — Discharge Instructions (Signed)
Ejercicios para la espalda (Back Exercises) Estos ejercicios ayudan a tratar y prevenir lesiones en la espalda. El objetivo es aumentar la fuerza de los msculos abdominales y dorsales y la flexibilidad de la espalda. Debe comenzar con estos ejercicios cuando ya no tenga dolor. Los ejercicios para la espalda incluyen:  Inclinacin de la pelvis - Recustese sobre la espalda con las rodillas flexionadas. Incline la pelvis hasta que la parte inferior de la espalda se apoye en el piso. Mantenga esta posicin durante 5 a 10 segundos y repita entre 5 y 67 veces.  Rodilla al pecho - Empuje primero una rodilla contra el pecho y [place] 20 a 30 segundos; repita con la otra rodilla y luego con ambas a la vez. Esto puede realizarlo con la otra pierna extendida o flexionada, del modo en que se sienta ms cmodo.  Abdominales o despegar el cccix del suelo empleando la musculatura abdominal - [place] 90 grados. Comience inclinando la pelvis y realice un ejercicio abdominal lento y parcial, elevando el tronco slo entre 89 y 18 grados del suelo. Emplee al [employer] 2 y 3 segundos para cada abdominal. No realice los abdominales con las rodillas extendidas. Si le resulta difcil realizar abdominales parciales, simplemente haga lo que se explic anteriormente, pero slo contraiga los msculos abdominales y Civil engineer, contracting tal como se le ha indicado.  Inclinacin de la cadera - Recustese sobre la espalda con las rodillas flexionadas a 90 grados. Empjese con los pies y los hombros mientras eleva la cadera un par de centmetros del suelo, [place] durante 10 segundos y repita entre 5 y 10 veces.  Arcos dorsales - Acustese sobre el [place] e impulse el tronco hacia atrs sobre los codos flexionados. Presione lentamente con las manos, formando un arco con la zona inferior de la espalda. Repita entre 3 y 5 veces. Al realizar las repeticiones, luego de un tiempo disminuirn la rigidez y las  [place].  Elevacin de los hombros - Acustese hacia abajo con los brazos a los lados del cuerpo. Muir y Photographer torso contra el suelo mientras eleva lentamente la cabeza y los hombros del suelo. No exagere con los ejercicios, especialmente en el comienzo. Los ejercicios pueden causar alguna molestia leve en la espalda durante algunos minutos; sin embargo, si el dolor es muy intenso, o dura ms de 15 minutos, no siga con la actividad fsica hasta que consulte al profesional que lo asiste. Los problemas en la espalda mejoran de [place] lenta con esta terapia.  Consulte al profesional para que lo ayude a planificar un programa de ejercicios adecuado para su espalda. Document Released: 09/27/2005 Document Revised: 12/20/2011 [facility] Patient Information 2015 [place]. This information is not intended to replace advice given to you by your health care provider. Make sure you discuss any questions you have with your health care provider. Dolor de espalda en el adulto (Back Pain, Adult)  El dolor de cintura es frecuente. Aproximadamente 1 de cada 5 personas lo sufren.La causa rara vez pone en peligro la vida. Con frecuencia mejora luego de algn tiempo.Alrededor de la mitad de las personas que sufren un inicio sbito de dolor de cintura, se sentirn mejor luego de 2 semanas. Aproximadamente 8 de cada 10 se sentirn mejor luego de 6 semanas.  CAUSAS  Algunas causas comunes son:   Distensin de los msculos o ligamentos que sostienen la columna vertebral.  Desgaste (degeneracin) de los discos vertebrales.  Artritis.  Traumatismos directos en la espalda. DIAGNSTICO  La  mayor parte de las veces, la causa directa no se conoce.Sin embargo, Conservation officer, historic buildings puede tratarse efectivamente an cuando no se Community education officer.Una de las formas ms precisas de asegurar que la causa del dolor no constituye un peligro es responder a las preguntas del mdico acerca de su salud y sus sntomas. Si el  mdico necesita ms informacin, podr indicar anlisis de laboratorio o Optometrist un diagnstico por imgenes (radiografas o Health visitor).Sin embargo, aunque las [employer] modificaciones, generalmente no es necesaria la [place].  INSTRUCCIONES PARA EL CUIDADO EN EL HOGAR  En algunas personas, el dolor de espalda vuelve.Como rara vez es peligroso, los pacientes pueden aprender a Education administrator.   Mantngase activo. Si permanece sentado o de pie mucho tiempo en el mismo lugar, se tensiona la espalda.  No se siente, maneje ni se quede parado en un mismo lugar por ms de 30 minutos. Realice caminatas cortas en superficies planas ni bien el dolor haya cedido. Trate de Orthoptist tiempo que camina .  No se quede en la cama.Si hace reposo durante ms de 1 o 2 das, puede Geologist, engineering.  No evite los ejercicios ni el trabajo.El cuerpo est hecho para moverse.No es peligroso estar [place], aunque le duela la espalda.La espalda se curar ms rpido si contina sus actividades antes de que el dolor se vaya.  Preste atencin a su cuerpo cuando se incline y se levante. Muchas personas sienten menos molestias cuando levantan objetos si doblan las rodillas, mantienen la carga cerca del cuerpo y evitan torcerse. Generalmente, las posiciones ms cmodas son las que ejercen menos tensin en la espalda en recuperacin.  Encuentre una posicin cmoda para dormir. Utilice un colchn firme y recustese de [place]. Doble ligeramente sus rodillas. Si se recuesta sobre su espalda, coloque una almohada debajo de sus rodillas.  Tome slo medicamentos de venta libre o recetados, segn las indicaciones del mdico. Los medicamentos de venta libre para Glass blower/designer y reducir Futures trader, son los que en general ms ayudan.El mdico podr prescribirle relajantes musculares.Estos medicamentos calman el dolor de modo que pueda retornar a sus actividades normales y a Psychiatrist.  Aplique hielo sobre la zona lesionada.  Ponga el hielo en una bolsa plstica.  Colquese una toalla entre la piel y la bolsa de hielo.  Deje la bolsa de hielo durante 15 a 20 minutos 3 a 4 veces por da, durante los primeros 2  3 das. Luego podr alternar Lyndal Pulley calor y 48 para reducir Conservation officer, historic buildings y los espasmos.  Consulte a su mdico si puede tratar de hacer ejercicios para la espalda y recibir un masaje suave. Pueden ser beneficiosos.  Evite sentirse ansioso o estresado.El estrs aumenta la tensin muscular y puede empeorar el dolor de espalda.Es importante reconocer cuando est ansioso o estresado y aprender la forma de controlarlos.El ejercicio es una gran opcin. SOLICITE ATENCIN MDICA SI:   Siente un dolor que no se alivia con reposo o medicamentos.  El dolor no mejora en 1 semana.  Desarrolla nuevos sntomas.  No se siente bien en general. SOLICITE ATENCIN MDICA DE INMEDIATO SI:  Siente un dolor que se irradia desde la espalda hacia sus piernas.  Desarrolla nuevos problemas en el intestino o la vejiga.  Siente debilidad o adormecimiento inusual en sus brazos o piernas.  Presenta nuseas o vmitos.  Presenta dolor abdominal.  Se siente desfalleciente. Document Released: 09/27/2005 Document Revised: 03/28/2012 [facility] Patient Information 2015 [place], [place]. This information  is not intended to replace advice given to you by your health care provider. Make sure you discuss any questions you have with your health care provider.

## 2015-06-05 NOTE — ED Notes (Signed)
Pt having lower back pain that started today while vacuuming.

## 2015-06-05 NOTE — ED Provider Notes (Signed)
CSN: [id number]     Arrival date & time 06/05/15  1843 History  This chart was scribed for non-physician practitioner, Waynetta Pean, PA-C working with Lacretia Leigh, MD by Hansel Feinstein, ED scribe. This patient was seen in room TR05C/TR05C and the patient's care was started at 7:34 PM    Chief Complaint  Patient presents with  . Back Pain   The history is provided by the patient. A language interpreter was used.    HPI Comments: Natalie Aguilar is a 34 y.o. female who presents to the Emergency Department complaining of moderate mid-lower 6/10 back pain, described as spasms and worse on the left, onset today while vacuuming.  She notes that she felt similar pain 3 months ago, but not this severe. Pt states that pain is worsened with movement. No treatments tried for back pain PTA. LNMP was 05/16/15. She states she has an IUD and does not suspect pregnancy. No Hx of cancer, IV drug use, recent illness. Pt denies recent trauma, injury, heavy lifting, falls. She also denies abdominal pain, nausea, vomiting, dysuria, frequency, urgency, hematuria, vaginal discharge, bowel or bladder incontinence, numbness or tingling in BLE or BUE, fever.   History reviewed. No pertinent past medical history. History reviewed. No pertinent past surgical history. Family History  Problem Relation Age of Onset  . Diabetes Mother    Social History  Substance Use Topics  . Smoking status: Never Smoker   . Smokeless tobacco: None  . Alcohol Use: No   OB History    No data available     Review of Systems  Constitutional: Negative for fever and chills.  Gastrointestinal: Negative for nausea, vomiting and abdominal pain.  Genitourinary: Negative for dysuria, urgency, frequency, hematuria, vaginal discharge and difficulty urinating.       No bowel or bladder incontinence  Musculoskeletal: Positive for back pain.  Skin: Negative for rash.  Neurological: Positive for weakness. Negative for numbness.   Allergies  Review  of patient's allergies indicates no known allergies.  Home Medications   Prior to Admission medications   Medication Sig Start Date End Date Taking? Authorizing Provider  acyclovir (ZOVIRAX) 400 MG tablet Take 1 tablet (400 mg total) by mouth 5 (five) times daily. 11/07/13   Reyne Dumas, MD  cefTRIAXone (ROCEPHIN) 250 MG injection Inject 250 mg into the muscle once.  FOR IM use in LARGE MUSCLE MASS 11/07/13   Reyne Dumas, MD  doxycycline (VIBRA-TABS) 100 MG tablet Take 1 tablet (100 mg total) by mouth 2 (two) times daily. 11/07/13   Reyne Dumas, MD  ferrous sulfate 325 (65 FE) MG tablet Take 1 tablet (325 mg total) by mouth daily with breakfast. 11/15/13   Reyne Dumas, MD  methocarbamol (ROBAXIN) 500 MG tablet Take 1 tablet (500 mg total) by mouth 2 (two) times daily as needed for muscle spasms. 06/05/15   Waynetta Pean, PA-C  metroNIDAZOLE (FLAGYL) 500 MG tablet Take 1 tablet (500 mg total) by mouth 3 (three) times daily. 11/07/13   Reyne Dumas, MD  naproxen (NAPROSYN) 250 MG tablet Take 1 tablet (250 mg total) by mouth 2 (two) times daily with a meal. 06/05/15   Waynetta Pean, PA-C  simethicone (GAS-X) 80 MG chewable tablet Chew 1 tablet (80 mg total) by mouth every 6 (six) hours as needed for flatulence. 07/24/13   Robbie Lis, MD   BP 114/64 mmHg  Pulse 63  Temp(Src) 98.2 F (36.8 C) (Oral)  Resp 18  SpO2 100%  LMP 05/16/2015 Physical Exam  Constitutional: She appears well-developed and well-nourished. No distress.  Pt is nontoxic appearing.   HENT:  Head: Normocephalic and atraumatic.  Eyes: Conjunctivae are normal. Pupils are equal, round, and reactive to light. Right eye exhibits no discharge. Left eye exhibits no discharge.  Neck: Normal range of motion. Neck supple.  no midline cervical tenderness.   Cardiovascular: Normal rate, regular rhythm, normal heart sounds and intact distal pulses.   Bilateral posterior tibialis and dorsalis pedis pulses are intact.  Pulmonary/Chest:  Effort normal and breath sounds normal. No respiratory distress. She has no wheezes. She has no rales.  Abdominal: Soft. She exhibits no distension. There is no tenderness.  Musculoskeletal: Normal range of motion. She exhibits tenderness. She exhibits no edema.  No midline back tenderness. Right lumbar low back tenderness to palpation. No back edema, deformity, ecchymosis, or step-offs. Normal alignment. Able to ambulate without difficulty or assistance.  Lymphadenopathy:    She has no cervical adenopathy.  Neurological: She is alert. She has normal reflexes. She displays normal reflexes. Coordination normal.  Bilateral patellar DTRs intact. Sensation is intact to her bilateral lower extremities.  Skin: Skin is warm and dry. No rash noted. She is not diaphoretic. No erythema. No pallor.  Psychiatric: She has a normal mood and affect. Her behavior is normal.  Nursing note and vitals reviewed.  ED Course  Procedures (including critical care time) DIAGNOSTIC STUDIES: Oxygen Saturation is 98% on RA, normal by my interpretation.    COORDINATION OF CARE: 7:45 PM Discussed treatment plan with pt at bedside and pt agreed to plan.   9:33 PM Pt is feeling better; able to walk with a normal gait.   Labs Review Labs Reviewed  URINALYSIS, ROUTINE W REFLEX MICROSCOPIC (NOT AT [facility]) - Abnormal; Notable for the following:    APPearance CLOUDY (*)    Leukocytes, UA TRACE (*)    All other components within normal limits  URINE MICROSCOPIC-ADD ON  POC URINE PREG, ED    Imaging Review No results found.   EKG Interpretation None      Filed Vitals:   06/05/15 1855 06/05/15 2145  BP: 113/65 114/64  Pulse: 76 63  Temp: 98.3 F (36.8 C) 98.2 F (36.8 C)  TempSrc: Oral Oral  Resp: 18 18  SpO2: 98% 100%     MDM   Meds given in ED:  Medications  ketorolac (TORADOL) 30 MG/ML injection 30 mg (30 mg Intramuscular Given 06/05/15 2006)  methocarbamol (ROBAXIN) tablet 500 mg (500 mg Oral Given  06/05/15 2007)    Discharge Medication List as of 06/05/2015  9:32 PM    START taking these medications   Details  methocarbamol (ROBAXIN) 500 MG tablet Take 1 tablet (500 mg total) by mouth 2 (two) times daily as needed for muscle spasms., Starting 06/05/2015, Until Discontinued, Print    naproxen (NAPROSYN) 250 MG tablet Take 1 tablet (250 mg total) by mouth 2 (two) times daily with a meal., Starting 06/05/2015, Until Discontinued, Print        Final diagnoses:  Back muscle spasm   Patient with right low back muscle spasm and pain starting today after vacuuming. She denies urinary symptoms. Urine pregnancy test is negative.  No neurological deficits and normal neuro exam.  Patient can walk but states is painful.  No loss of bowel or bladder control.  No concern for cauda equina.  No fever, night sweats, weight loss, h/o cancer, IVDU.  Patient given Toradol and Robaxin in the emergency department and had  reevaluation patient reports better and is able to ambulate with normal gait. RICE protocol and pain medicine indicated and discussed with patient.  I advised the patient to follow-up with their primary care provider this week. I advised the patient to return to the emergency department with new or worsening symptoms or new concerns. The patient verbalized understanding and agreement with plan.     I personally performed the services described in this documentation, which was scribed in my presence. The recorded information has been reviewed and is accurate.    Waynetta Pean, PA-C 06/05/15 2155  Lacretia Leigh, MD 06/05/15 [phone]

## 2017-08-15 ENCOUNTER — Ambulatory Visit: Payer: Self-pay | Attending: Nurse Practitioner | Admitting: Nurse Practitioner

## 2017-08-15 ENCOUNTER — Encounter: Payer: Self-pay | Admitting: Nurse Practitioner

## 2017-08-15 VITALS — BP 114/66 | HR 68 | Temp 98.5°F | Resp 18 | Ht 67.0 in | Wt 160.6 lb

## 2017-08-15 DIAGNOSIS — D509 Iron deficiency anemia, unspecified: Secondary | ICD-10-CM | POA: Insufficient documentation

## 2017-08-15 DIAGNOSIS — Z833 Family history of diabetes mellitus: Secondary | ICD-10-CM | POA: Insufficient documentation

## 2017-08-15 DIAGNOSIS — N760 Acute vaginitis: Secondary | ICD-10-CM | POA: Insufficient documentation

## 2017-08-15 DIAGNOSIS — R5383 Other fatigue: Secondary | ICD-10-CM | POA: Insufficient documentation

## 2017-08-15 DIAGNOSIS — Z131 Encounter for screening for diabetes mellitus: Secondary | ICD-10-CM | POA: Insufficient documentation

## 2017-08-15 DIAGNOSIS — Z23 Encounter for immunization: Secondary | ICD-10-CM | POA: Insufficient documentation

## 2017-08-15 DIAGNOSIS — R42 Dizziness and giddiness: Secondary | ICD-10-CM | POA: Insufficient documentation

## 2017-08-15 LAB — POCT GLYCOSYLATED HEMOGLOBIN (HGB A1C): Hemoglobin A1C: 5.4

## 2017-08-15 NOTE — Patient Instructions (Addendum)
Mareos (Dizziness) Los mareos son un problema muy frecuente. Causan sensacin de inestabilidad o de desvanecimiento. Puede sentir que se va a desmayar. Un mareo puede provocarle una lesin si se tropieza o se cae. Cualquier persona puede marearse, pero los [place] son ms frecuentes en los [employer]. Esta afeccin puede tener muchas causas, por ejemplo:  Medicamentos.  Deshidratacin.  Enfermedad. CUIDADOS EN EL HOGAR Estas indicaciones pueden ayudarlo con el trastorno: Comida y bebida  Beba suficiente lquido para Theatre manager el pis (orina) claro o de color amarillo plido. Esto evita la deshidratacin. Trate de beber ms lquidos transparentes, como agua.  No beba alcohol.  Limite la cantidad de cafena que bebe o come si el mdico se lo indic.  Limite la cantidad de sal que bebe o come si el mdico se lo indic. Actividad  Evite los movimientos rpidos. ? Cuando se levante de una silla, sujtese hasta sentirse bien. ? Por la maana, sintese primero a un lado de la cama. Cuando se sienta bien, pngase lentamente de pie mientras se sostiene de algo, hasta que sepa que ha logrado el equilibrio.  Mueva las piernas con frecuencia si debe estar de pie en un lugar durante mucho tiempo. Mientras est de pie, contraiga y relaje los msculos de las piernas.  No conduzca vehculos ni utilice maquinarias pesadas si se siente mareado.  Evite agacharse si se siente mareado. En su casa, coloque los objetos de modo que le resulte fcil alcanzarlos sin Office manager. Estilo de vida  No consuma ningn producto que contenga tabaco, lo que incluye cigarrillos, tabaco de Higher education careers adviser o Psychologist, sport and exercise. Si necesita ayuda para dejar de fumar, consulte al [employer].  Trate de reducir el nivel de estrs practicando actividades como el yoga o la meditacin. Hable con el mdico si necesita ayuda. Instrucciones generales  Controle sus mareos para ver si hay cambios.  Tome los medicamentos solamente  como se lo haya indicado el mdico. Hable con el mdico si cree que algn medicamento que est tomando es la causa de sus [place].  Infrmele a un amigo o a un familiar si se siente mareado. Pdale a esta persona que llame al mdico si observa cambios en su comportamiento.  Concurra a todas las visitas de control como se lo haya indicado el mdico. Esto es importante. SOLICITE AYUDA SI:  Los [employer].  Los [employer] o la sensacin de Engineer, petroleum.  Siente malestar estomacal (nuseas).  Tiene problemas para escuchar.  Aparecen nuevos sntomas.  Cuando est de pie se siente inestable o que la habitacin da vueltas.  SOLICITE AYUDA DE INMEDIATO SI:  Vomita o tiene diarrea y no puede comer ni beber nada.  Tiene dificultad para lo siguiente: ? Hablar. ? Caminar. ? Tragar. ? Usar los brazos, las [place] piernas.  Siente una debilidad generalizada.  No piensa con claridad o tiene dificultades para armar oraciones. Es posible que un amigo o un familiar adviertan que esto ocurre.  Tiene los siguientes sntomas: ? Tourist information centre manager. ? Dolor en el vientre (abdomen). ? Falta de aire. ? Sudoracin.  Cambios en la visin.  Hemorragias.  Dolores de [place].  Dolor o rigidez en el cuello.  Cristy Hilts.  Esta informacin no tiene Marine scientist el consejo del mdico. Asegrese de hacerle al mdico cualquier pregunta que tenga. Document Released: 09/16/2011 Document Revised: 02/11/2015 Document Reviewed: 09/23/2014 Elsevier Interactive Patient Education  2017 [place] (Fatigue) La fatiga es una sensacin de cansancio en todo momento, falta de  energa o falta de motivacin. La fatiga ocasional o leve con frecuencia es una reaccin normal a la actividad o la vida en general. Sin embargo, la fatiga de Engineer, site duracin (crnica) o extrema puede indicar una enfermedad preexistente. INSTRUCCIONES PARA EL CUIDADO EN EL HOGAR Controle su fatiga para ver  si hay cambios. Las siguientes indicaciones ayudarn a Writer [employer] pueda sentir:  Hable con el mdico acerca de cunto debe dormir cada noche. Trate de dormir la cantidad de tiempo requerida todas las noches.  Tome los medicamentos solamente como se lo haya indicado el mdico.  Siga una dieta saludable y nutritiva. Pida ayuda al mdico si necesita hacer cambios en su dieta.  Beba suficiente lquido para Consulting civil engineer orina clara o de color amarillo plido.  Practique actividades que lo relajen, como yoga, meditacin, terapia de [place] o acupuntura.  Haga ejercicios regularmente.  Cambie las situaciones que le provocan estrs. Trate de que su [place] y personal sea moderada.  No consuma drogas.  Limite el consumo de alcohol a no ms de 1 medida por da si es mujer y no est Music therapist, y 2 medidas si es hombre. Una medida equivale a 12onzas de cerveza, 5onzas de vino o 1onzas de bebidas alcohlicas de alta graduacin.  Tome una multivitamina, si se lo indic el mdico. SOLICITE ATENCIN MDICA SI:  La fatiga no mejora.  Tiene fiebre.  Tiene prdida o aumento involuntario de [place].  Tiene dolores de [place].  Tiene dificultad para: ? Dormirse. ? Dormir durante toda la noche.  Se siente enojado, culpable, ansioso o triste.  No puede defecar (estreimiento).  Tiene la piel seca.  Tiene hinchadas las piernas u otra parte del cuerpo. SOLICITE ATENCIN MDICA DE INMEDIATO SI:  Se siente confundido.  Tiene visin borrosa.  Sufre mareos o se desmaya.  Sufre un dolor intenso de [place].  Siente dolor intenso en el abdomen, la pelvis o la espalda.  Tiene dolor de pecho, dificultad para respirar, o latidos cardacos irregulares o acelerados.  No puede orinar u [employer] de lo normal.  Presenta sangrado anormal, como sangrado del recto, la vagina, la nariz, los pulmones o los pezones.  Vomita sangre.  Tiene pensamientos acerca de hacerse  dao a s mismo o cometer suicidio.  Le preocupa la posibilidad de hacerle dao a otra persona. Esta informacin no tiene Marine scientist el consejo del mdico. Asegrese de hacerle al mdico cualquier pregunta que tenga. Document Released: 01/13/2009 Document Revised: 01/19/2016 Document Reviewed: 01/29/2014 Elsevier Interactive Patient Education  2018 [place]. Anemia inespecfica (Anemia, Nonspecific) La anemia es una enfermedad en la que la concentracin de glbulos rojos o el nivel de hemoglobina en la sangre estn por debajo de lo normal. La hemoglobina es la sustancia de los glbulos rojos que lleva el oxgeno a todo el cuerpo. La anemia da como resultado que los tejidos no reciban la cantidad suficiente de oxgeno. CAUSAS Las causas ms frecuentes de anemia son:  Elvina Mattes. El sangrado puede ser interno o externo. Incluye sangrado excesivo debido al perodo (en las mujeres) o por los intestinos.  Dficit nutricional.  Enfermedad renal, tiroidea o heptica crnicas.  Enfermedades de la mdula sea que disminuyen la produccin de glbulos rojos.  Cncer y tratamientos para Science writer.  VIH, sida y sus tratamientos.  Trastornos del bazo que aumentan la destruccin de glbulos rojos.  Enfermedades de [employer].  Destruccin excesiva de glbulos rojos debido a una infeccin, a medicamentos y a Arboriculturist  autoinmunes. SIGNOS Y SNTOMAS  Debilidad leve.  Mareos.  Dolor de [place].  Palpitaciones.  Falta de aire, especialmente con el ejercicio.  Palidez.  Sensibilidad al fro.  Indigestin.  Nuseas.  Dificultad para dormir.  Dificultad para concentrarse. Los sntomas pueden ocurrir repentinamente o pueden Psychologist, forensic. DIAGNSTICO Con frecuencia es necesario realizar anlisis de [employer]. Estos ayudan al profesional a Adult nurse. Su mdico controlar la materia fecal para Hydrographic surveyor la presencia de [place]  y buscar otras causas de prdida de [place]. TRATAMIENTO El tratamiento vara segn la causa de la anemia. Las opciones de tratamiento son:  Suplementos de hierro, vitamina J33, o cido flico.  Medicamentos con hormonas.  Transfusin de [place]. Ser necesaria en los casos de prdida de [place] grave.  Hospitalizacin. Ser necesaria si la prdida de sangre es continua y significativa.  Cambios en la dieta.  Extirpacin del bazo. INSTRUCCIONES PARA EL CUIDADO EN EL HOGAR Cumpla con todas las visitas de control. Generalmente demora varias semanas corregir la anemia, y es muy importante que el mdico controle su enfermedad y su respuesta al [place]. SOLICITE ATENCIN MDICA DE INMEDIATO SI:  Siente debilidad extrema, falta de aire o dolor en el pecho.  Se siente mareado o tiene dificultad para concentrarse.  Tiene una hemorragia vaginal abundante.  Aparece una erupcin cutnea.  La materia fecal es negra, de aspecto alquitranado.  Se desmaya.  Vomita sangre.  Vomita repetidas veces.  Siente dolor abdominal.  Tiene fiebre o sntomas persistentes durante ms de 2 - 3 das.  Tiene fiebre y los sntomas empeoran repentinamente.  Se deshidrata.  ASEGRESE DE QUE:  Comprende estas instrucciones.  Controlar su afeccin.  Recibir ayuda de inmediato si no mejora o si empeora.  Esta informacin no tiene Marine scientist el consejo del mdico. Asegrese de hacerle al mdico cualquier pregunta que tenga. Document Released: 09/27/2005 Document Revised: 05/30/2013 Document Reviewed: 03/23/2013 Elsevier Interactive Patient Education  2017 [employer].

## 2017-08-15 NOTE — Progress Notes (Signed)
CC: Vaginal burning and discharge.   HPI: Natalie Aguilar is a 36 y.o. female here today with concerns of vaginitis or UTI. VRI was used for office visit today.   Vaginitis:  Patient complains of an abnormal vaginal discharge for 2 months. Vaginal symptoms include abnormal bleeding: irregular with spotting in between cycles and clotting. , burning and discharge described as white.  She denies flank pain, abdominal pain, suprapubic pain or pressure, nausea or vomiting.  STI Risk: Very low risk of STD exposure. Discharge described as: thick.Other associated symptoms: fatigue and dizziness. Contraception: IUD and which was placed 2.5 years ago.      ALLERGIES: No Known Allergies   PAST MEDICAL HISTORY: History reviewed. No pertinent past medical history.  SOCIAL HISTORY Social History   Socioeconomic History  . Marital status: Single    Spouse name: Not on file  . Number of children: Not on file  . Years of education: Not on file  . Highest education level: Not on file  Social Needs  . Financial resource strain: Not on file  . Food insecurity - worry: Not on file  . Food insecurity - inability: Not on file  . Transportation needs - medical: Not on file  . Transportation needs - non-medical: Not on file  Occupational History  . Not on file  Tobacco Use  . Smoking status: Never Smoker  Substance and Sexual Activity  . Alcohol use: No  . Drug use: No  . Sexual activity: Not on file  Other Topics Concern  . Not on file  Social History Narrative  . Not on file    FAMILY HISTORY Family History  Problem Relation Age of Onset  . Diabetes Mother      MEDICATIONS AT HOME: Prior to Admission medications   Medication Sig Start Date End Date Taking? Authorizing Provider  acyclovir (ZOVIRAX) 400 MG tablet Take 1 tablet (400 mg total) by mouth 5 (five) times daily. Patient not taking: Reported on 08/15/2017 11/07/13   Reyne Dumas, MD  cefTRIAXone (ROCEPHIN) 250 MG injection  Inject 250 mg into the muscle once.  FOR IM use in LARGE MUSCLE MASS Patient not taking: Reported on 08/15/2017 11/07/13   Reyne Dumas, MD  doxycycline (VIBRA-TABS) 100 MG tablet Take 1 tablet (100 mg total) by mouth 2 (two) times daily. Patient not taking: Reported on 08/15/2017 11/07/13   Reyne Dumas, MD  ferrous sulfate 325 (65 FE) MG tablet Take 1 tablet (325 mg total) by mouth daily with breakfast. Patient not taking: Reported on 08/15/2017 11/15/13   Reyne Dumas, MD  methocarbamol (ROBAXIN) 500 MG tablet Take 1 tablet (500 mg total) by mouth 2 (two) times daily as needed for muscle spasms. Patient not taking: Reported on 08/15/2017 06/05/15   Waynetta Pean, PA-C  metroNIDAZOLE (FLAGYL) 500 MG tablet Take 1 tablet (500 mg total) by mouth 3 (three) times daily. Patient not taking: Reported on 08/15/2017 11/07/13   Reyne Dumas, MD  naproxen (NAPROSYN) 250 MG tablet Take 1 tablet (250 mg total) by mouth 2 (two) times daily with a meal. Patient not taking: Reported on 08/15/2017 06/05/15   Waynetta Pean, PA-C  simethicone (GAS-X) 80 MG chewable tablet Chew 1 tablet (80 mg total) by mouth every 6 (six) hours as needed for flatulence. Patient not taking: Reported on 08/15/2017 07/24/13   Robbie Lis, MD    Review of Systems  Constitutional: Positive for malaise/fatigue. Negative for chills and fever.  Respiratory: Negative for cough and shortness of  breath.   Cardiovascular: Negative for chest pain.  Gastrointestinal: Negative for abdominal pain, nausea and vomiting.  Genitourinary: Negative for dysuria, flank pain, frequency, hematuria and urgency.  Musculoskeletal: Negative for back pain.  Skin: Negative.   Neurological: Positive for dizziness. Negative for tingling, focal weakness and headaches.  Psychiatric/Behavioral: Negative.  Negative for suicidal ideas.      Objective:   Vitals:   08/15/17 0906  BP: 114/66  Pulse: 68  Resp: 18  Temp: 98.5 F (36.9 C)  SpO2: 100%     Physical Exam: Constitutional: Patient appears well-developed and well-nourished. No distress. HENT: Normocephalic, atraumatic, External right and left ear normal. Oropharynx is clear and moist.  CVS: RRR, S1/S2 +, no murmurs, no gallops, no carotid bruit.  Pulmonary: Effort and breath sounds normal, no stridor, rhonchi, wheezes, rales.  Abdominal: Soft. BS +,  no distension, tenderness, rebound or guarding.  Musculoskeletal: Normal range of motion. No edema and no CVA tenderness.  Neuro: Alert. Normal reflexes, muscle tone coordination. No cranial nerve deficit. Skin: Skin is warm and dry. No rash noted. Not diaphoretic. No erythema. No pallor. Psychiatric: Normal mood and affect. Behavior, judgment, thought content normal.  Lab Results  Component Value Date   WBC 8.0 11/07/2013   HGB 11.5 (L) 11/07/2013   HCT 35.6 (L) 11/07/2013   MCV 73.9 (L) 11/07/2013   PLT 246 11/07/2013   Lab Results  Component Value Date   CREATININE 0.61 11/07/2013   BUN 16 11/07/2013   NA 137 11/07/2013   K 4.3 11/07/2013   CL 103 11/07/2013   CO2 26 11/07/2013   Lab Results  Component Value Date   HGBA1C 5.4 08/15/2017         Assessment and plan:    Natalie Aguilar was seen today for establish care.  Diagnoses and all orders for this visit:  Acute vaginitis -     Urine cytology ancillary only UA negative  Fatigue, unspecified type -     CBC Patient has a history of iron deficiency anemia and is currently not taking iron tablets.   Dizziness -     CBC Patient has a history of iron deficiency anemia and is currently not taking iron tablets.    Diabetes mellitus screening -     HgB A1c Patient endorses a family history of diabetes (mother) and is requesting to "be checked for diabetes".  Need for influenza vaccination -     Flu Vaccine QUAD 36+ mos IM        Patient has been counseled extensively about nutrition and exercise. Other issues discussed during this visit include: low  cholesterol diet, weight control with exercise at least 150 minutes per week, foot care, annual eye examinations at Ophthalmology, importance of adherence with medications and regular follow-up.    No Follow-up on file.  The patient was given clear instructions to go to ER or return to medical center if symptoms don't improve, worsen or new problems develop. The patient verbalized understanding.      Gildardo Pounds, FNP-BC, [facility] and [facility] [place], [place]

## 2017-08-16 LAB — CBC
Hematocrit: 30.7 % — ABNORMAL LOW (ref 34.0–46.6)
Hemoglobin: 9 g/dL — ABNORMAL LOW (ref 11.1–15.9)
MCH: 17.4 pg — AB (ref 26.6–33.0)
MCHC: 29.3 g/dL — AB (ref 31.5–35.7)
MCV: 59 fL — ABNORMAL LOW (ref 79–97)
PLATELETS: 351 10*3/uL (ref 150–379)
RBC: 5.18 x10E6/uL (ref 3.77–5.28)
RDW: 20 % — ABNORMAL HIGH (ref 12.3–15.4)
WBC: 6.3 10*3/uL (ref 3.4–10.8)

## 2017-08-16 LAB — URINE CYTOLOGY ANCILLARY ONLY
CHLAMYDIA, DNA PROBE: NEGATIVE
NEISSERIA GONORRHEA: NEGATIVE
Trichomonas: NEGATIVE

## 2017-08-18 LAB — URINE CYTOLOGY ANCILLARY ONLY
BACTERIAL VAGINITIS: NEGATIVE
CANDIDA VAGINITIS: NEGATIVE

## 2017-08-19 ENCOUNTER — Ambulatory Visit: Payer: Self-pay | Attending: Nurse Practitioner

## 2017-10-05 ENCOUNTER — Ambulatory Visit: Payer: Self-pay | Attending: Nurse Practitioner | Admitting: Physician Assistant

## 2017-10-05 VITALS — BP 104/66 | HR 69 | Temp 98.8°F | Resp 16 | Wt 159.2 lb

## 2017-10-05 DIAGNOSIS — H838X9 Other specified diseases of inner ear, unspecified ear: Secondary | ICD-10-CM | POA: Insufficient documentation

## 2017-10-05 DIAGNOSIS — R519 Headache, unspecified: Secondary | ICD-10-CM

## 2017-10-05 DIAGNOSIS — H938X9 Other specified disorders of ear, unspecified ear: Secondary | ICD-10-CM

## 2017-10-05 DIAGNOSIS — H539 Unspecified visual disturbance: Secondary | ICD-10-CM | POA: Insufficient documentation

## 2017-10-05 DIAGNOSIS — R51 Headache: Secondary | ICD-10-CM | POA: Insufficient documentation

## 2017-10-05 DIAGNOSIS — K0889 Other specified disorders of teeth and supporting structures: Secondary | ICD-10-CM | POA: Insufficient documentation

## 2017-10-05 DIAGNOSIS — N898 Other specified noninflammatory disorders of vagina: Secondary | ICD-10-CM | POA: Insufficient documentation

## 2017-10-05 DIAGNOSIS — R42 Dizziness and giddiness: Secondary | ICD-10-CM | POA: Insufficient documentation

## 2017-10-05 MED ORDER — IBUPROFEN 600 MG PO TABS: 600.0000 mg | ORAL_TABLET | Freq: Three times a day (TID) | ORAL | 0 refills | Status: DC | PRN

## 2017-10-05 MED ORDER — FLUTICASONE PROPIONATE 50 MCG/ACT NA SUSP: 2.0000 | Freq: Every day | NASAL | 6 refills | Status: DC

## 2017-10-05 NOTE — Progress Notes (Signed)
Pt states her nails tend to break Pt states she has been having a lot of headaches

## 2017-10-05 NOTE — Progress Notes (Signed)
Patient ID: Natalie Aguilar, female   DOB: 08-Oct-1981, 36 y.o.   MRN: [id number]     Natalie Aguilar, is a 36 y.o. female  WER:[id number]  [id number]:[id number]  DOB - [birth year]  Subjective:  Chief Complaint and HPI: Natalie Aguilar is a 36 y.o. female here today for multiple issues.  "Natalie Aguilar" with [employer]. Nails breaking very easily X several months.  Some peeling. No changes in diet.   Dizziness X 1 month.  Nothing seems to precipitate the dizziness; lasts 10-15 mins.  Some trouble with vision when reading but no double vision or acute vision changes  Headaches X 1 month.  Headaches are in frontal region and back of head.  She has not taken any OTC meds for the headaches.  She is having some nasal and sinus congestion.    Also having tooth pain and wants dental referral.  Periods irregular since getting an IUD and she has had continued changes in vaginal discharge.  STI testing was sent last month and all negative-no changes since then.    ROS:   Constitutional:  No f/c, No night sweats, No unexplained weight loss. EENT:  No vision changes(except with reading), No blurry vision, No hearing changes. No mouth, throat, or ear problems other than some sinus congestion Respiratory: No cough, No SOB Cardiac: No CP, no palpitations GI:  No abd pain, No N/V/D. GU: No Urinary s/sx Musculoskeletal: No joint pain Neuro: + headache, + dizziness, no motor weakness.  Skin: No rash Endocrine:  No polydipsia. No polyuria.  Psych: Denies SI/HI  No problems updated.  ALLERGIES: No Known Allergies  PAST MEDICAL HISTORY: History reviewed. No pertinent past medical history.  MEDICATIONS AT HOME: Prior to Admission medications   Medication Sig Start Date End Date Taking? Authorizing Provider  acyclovir (ZOVIRAX) 400 MG tablet Take 1 tablet (400 mg total) by mouth 5 (five) times daily. Patient not taking: Reported on 08/15/2017 11/07/13   Reyne Dumas, MD  ferrous sulfate 325 (65 FE) MG tablet Take 1  tablet (325 mg total) by mouth daily with breakfast. Patient not taking: Reported on 08/15/2017 11/15/13   Reyne Dumas, MD  fluticasone (FLONASE) 50 MCG/ACT nasal spray Place 2 sprays into both nostrils daily. 10/05/17   Argentina Donovan, PA-C  ibuprofen (ADVIL,MOTRIN) 600 MG tablet Take 1 tablet (600 mg total) by mouth every 8 (eight) hours as needed. 10/05/17   Argentina Donovan, PA-C     Objective:  EXAM:   Vitals:   10/05/17 1401  BP: 104/66  Pulse: 69  Resp: 16  Temp: 98.8 F (37.1 C)  TempSrc: Oral  SpO2: 100%  Weight: 159 lb 3.2 oz (72.2 kg)    General appearance : A&OX3. NAD. Non-toxic-appearing HEENT: Atraumatic and Normocephalic.  PERRLA. EOM intact.  Fundi benign. TM clear B. Mouth-MMM, post pharynx WNL w/o erythema, No PND. Neck: supple, no JVD. No cervical lymphadenopathy. No thyromegaly Chest/Lungs:  Breathing-non-labored, Good air entry bilaterally, breath sounds normal without rales, rhonchi, or wheezing  CVS: S1 S2 regular, no murmurs, gallops, rubs  Extremities: Bilateral Lower Ext shows no edema, both legs are warm to touch with = pulse throughout Neurology:  CN II-XII grossly intact, Non focal.  Facial symmetry WNL Psych:  TP linear. J/I WNL. Normal speech. Appropriate eye contact and affect.  Skin:  No Rash  Data Review Lab Results  Component Value Date   HGBA1C 5.4 08/15/2017     Assessment & Plan   1. Vision changes Sounds related to age-related  changes/reading only - Ambulatory referral to Ophthalmology  2. Nonintractable headache, unspecified chronicity pattern, unspecified headache type No red flags - Ambulatory referral to Ophthalmology - Vitamin D, 25-hydroxy - TSH - Comprehensive metabolic panel - CBC with Differential/Platelet - ibuprofen (ADVIL,MOTRIN) 600 MG tablet; Take 1 tablet (600 mg total) by mouth every 8 (eight) hours as needed.  Dispense: 30 tablet; Refill: 0  3. Dizziness No red flags - Comprehensive metabolic panel -  CBC with Differential/Platelet - fluticasone (FLONASE) 50 MCG/ACT nasal spray; Place 2 sprays into both nostrils daily.  Dispense: 16 g; Refill: 6  4. Ear congestion, unspecified laterality - fluticasone (FLONASE) 50 MCG/ACT nasal spray; Place 2 sprays into both nostrils daily.  Dispense: 16 g; Refill: 6  5. Tooth pain No abscess or acute issues - Ambulatory referral to Dentistry   Patient have been counseled extensively about nutrition and exercise  Return in about 1 month (around 11/05/2017) for Natalie Aguilar-f/up HA and dizziness.  The patient was given clear instructions to go to ER or return to medical center if symptoms don't improve, worsen or new problems develop. The patient verbalized understanding. The patient was told to call to get lab results if they haven't heard anything in the next week.     Freeman Caldron, PA-C [facility] and [facility] [place], [place]   10/05/2017, 2:25 PM

## 2017-10-06 ENCOUNTER — Other Ambulatory Visit: Payer: Self-pay | Admitting: Physician Assistant

## 2017-10-06 LAB — COMPREHENSIVE METABOLIC PANEL
ALBUMIN: 4.5 g/dL (ref 3.5–5.5)
ALK PHOS: 50 IU/L (ref 39–117)
ALT: 11 IU/L (ref 0–32)
AST: 14 IU/L (ref 0–40)
Albumin/Globulin Ratio: 1.6 (ref 1.2–2.2)
BUN / CREAT RATIO: 23 (ref 9–23)
BUN: 15 mg/dL (ref 6–20)
Bilirubin Total: 0.4 mg/dL (ref 0.0–1.2)
CO2: 22 mmol/L (ref 20–29)
CREATININE: 0.65 mg/dL (ref 0.57–1.00)
Calcium: 9.1 mg/dL (ref 8.7–10.2)
Chloride: 104 mmol/L (ref 96–106)
GFR calc Af Amer: 132 mL/min/{1.73_m2} (ref >59)
GFR calc non Af Amer: 115 mL/min/{1.73_m2} (ref >59)
GLUCOSE: 87 mg/dL (ref 65–99)
Globulin, Total: 2.9 g/dL (ref 1.5–4.5)
Potassium: 4.6 mmol/L (ref 3.5–5.2)
Sodium: 140 mmol/L (ref 134–144)
Total Protein: 7.4 g/dL (ref 6.0–8.5)

## 2017-10-06 LAB — CBC WITH DIFFERENTIAL/PLATELET
BASOS ABS: 0 10*3/uL (ref 0.0–0.2)
Basos: 1 %
EOS (ABSOLUTE): 0.2 10*3/uL (ref 0.0–0.4)
EOS: 2 %
HEMATOCRIT: 32.8 % — AB (ref 34.0–46.6)
HEMOGLOBIN: 9.6 g/dL — AB (ref 11.1–15.9)
IMMATURE GRANS (ABS): 0 10*3/uL (ref 0.0–0.1)
IMMATURE GRANULOCYTES: 0 %
LYMPHS ABS: 1.9 10*3/uL (ref 0.7–3.1)
LYMPHS: 30 %
MCH: 17.7 pg — ABNORMAL LOW (ref 26.6–33.0)
MCHC: 29.3 g/dL — ABNORMAL LOW (ref 31.5–35.7)
MCV: 61 fL — ABNORMAL LOW (ref 79–97)
MONOCYTES: 7 %
Monocytes Absolute: 0.5 10*3/uL (ref 0.1–0.9)
NEUTROS PCT: 60 %
Neutrophils Absolute: 3.8 10*3/uL (ref 1.4–7.0)
Platelets: 296 10*3/uL (ref 150–379)
RBC: 5.41 x10E6/uL — AB (ref 3.77–5.28)
RDW: 19.8 % — ABNORMAL HIGH (ref 12.3–15.4)
WBC: 6.4 10*3/uL (ref 3.4–10.8)

## 2017-10-06 LAB — TSH: TSH: 1.27 u[IU]/mL (ref 0.450–4.500)

## 2017-10-06 LAB — VITAMIN D 25 HYDROXY (VIT D DEFICIENCY, FRACTURES): Vit D, 25-Hydroxy: 21.7 ng/mL — ABNORMAL LOW (ref 30.0–100.0)

## 2017-10-06 MED ORDER — FERROUS SULFATE 325 (65 FE) MG PO TABS: 325.0000 mg | ORAL_TABLET | Freq: Every day | ORAL | 3 refills | Status: DC

## 2017-10-06 MED ORDER — VITAMIN D (ERGOCALCIFEROL) 1.25 MG (50000 UNIT) PO CAPS: 50000.0000 [IU] | ORAL_CAPSULE | ORAL | 0 refills | Status: DC

## 2017-10-10 ENCOUNTER — Telehealth: Payer: Self-pay

## 2017-10-10 NOTE — Telephone Encounter (Signed)
[employer] [phone] contacted pt to go over lab results pt is aware of results and doesn't have any questions or concerns

## 2017-11-08 ENCOUNTER — Ambulatory Visit: Payer: Self-pay | Admitting: Nurse Practitioner

## 2017-11-09 ENCOUNTER — Encounter: Payer: Self-pay | Admitting: Nurse Practitioner

## 2017-11-09 ENCOUNTER — Ambulatory Visit: Payer: Self-pay | Attending: Nurse Practitioner | Admitting: Nurse Practitioner

## 2017-11-09 VITALS — BP 114/74 | HR 59 | Temp 98.4°F | Ht 67.0 in | Wt 158.6 lb

## 2017-11-09 DIAGNOSIS — R519 Headache, unspecified: Secondary | ICD-10-CM

## 2017-11-09 DIAGNOSIS — N920 Excessive and frequent menstruation with regular cycle: Secondary | ICD-10-CM | POA: Insufficient documentation

## 2017-11-09 DIAGNOSIS — Z833 Family history of diabetes mellitus: Secondary | ICD-10-CM | POA: Insufficient documentation

## 2017-11-09 DIAGNOSIS — N926 Irregular menstruation, unspecified: Secondary | ICD-10-CM

## 2017-11-09 DIAGNOSIS — R102 Pelvic and perineal pain: Secondary | ICD-10-CM | POA: Insufficient documentation

## 2017-11-09 DIAGNOSIS — E559 Vitamin D deficiency, unspecified: Secondary | ICD-10-CM | POA: Insufficient documentation

## 2017-11-09 DIAGNOSIS — Z791 Long term (current) use of non-steroidal anti-inflammatories (NSAID): Secondary | ICD-10-CM | POA: Insufficient documentation

## 2017-11-09 DIAGNOSIS — G4700 Insomnia, unspecified: Secondary | ICD-10-CM | POA: Insufficient documentation

## 2017-11-09 DIAGNOSIS — G4709 Other insomnia: Secondary | ICD-10-CM

## 2017-11-09 DIAGNOSIS — R51 Headache: Secondary | ICD-10-CM | POA: Insufficient documentation

## 2017-11-09 DIAGNOSIS — Z79899 Other long term (current) drug therapy: Secondary | ICD-10-CM | POA: Insufficient documentation

## 2017-11-09 DIAGNOSIS — R42 Dizziness and giddiness: Secondary | ICD-10-CM | POA: Insufficient documentation

## 2017-11-09 MED ORDER — VITAMIN D (ERGOCALCIFEROL) 1.25 MG (50000 UNIT) PO CAPS: 50000.0000 [IU] | ORAL_CAPSULE | ORAL | 0 refills | Status: DC

## 2017-11-09 MED ORDER — FERROUS SULFATE 325 (65 FE) MG PO TABS: 325.0000 mg | ORAL_TABLET | Freq: Every day | ORAL | 3 refills | Status: DC

## 2017-11-09 MED ORDER — TRAZODONE HCL 50 MG PO TABS: 50.0000 mg | ORAL_TABLET | Freq: Every evening | ORAL | 1 refills | Status: DC | PRN

## 2017-11-09 NOTE — Progress Notes (Signed)
Assessment & Plan:  Natalie Aguilar was seen today for eye problem and contraception.  Diagnoses and all orders for this visit:  Frequent headaches Continue ibuprofen as prescribed. May also alternate with XS tylenol. Instructed to bring a headache diary to her next appointment.  Other insomnia -     traZODone (DESYREL) 50 MG tablet; Take 1 tablet (50 mg total) by mouth at bedtime as needed for sleep.  Irregular menstrual bleeding -     Ambulatory referral to Gynecology -     ferrous sulfate 325 (65 FE) MG tablet; Take 1 tablet (325 mg total) by mouth daily with breakfast.  Vitamin D deficiency -     Vitamin D, Ergocalciferol, (DRISDOL) 50000 units CAPS capsule; Take 1 capsule (50,000 Units total) by mouth every 7 (seven) days. Continue Vitamin D for 12 weeks   Patient has been counseled on age-appropriate routine health concerns for screening and prevention. These are reviewed and up-to-date. Referrals have been placed accordingly. Immunizations are up-to-date or declined.    Subjective:   Chief Complaint  Patient presents with  . Eye Problem    Patient is stated she do not feel dizzy and her headache has decreased. Patient stated her eyes feel tires lately and she think there are particles in her eyes that may bother with her vision. Patient stated some times she will wake up with pain on her head and neck.   . Contraception    Patient would like to know if she can remover her IUD. Patient stated she had her period on the 4th and 16th of January. Patient stated she had a brown discharge on Monday.    HPI  VRI was used to communicate directly with patient for the entire encounter including providing detailed patient instructions.  Natalie Aguilar 37 y.o. female presents to office today for follow up of headache and dizziness. She was seen in this clinic on 10-05-2017 with multiple complaints including headache and dizziness. Labs were only minimally abnormal. She does have IDA which could be  contributing to her symptoms. Today she does endorse resolution of her dizzy spells.     Headaches Taking ibuprofen 600mg  with significant relief of symptoms. Aggravating factors: None. Wakes up with a headache. Pain is occipital in nature.  Onset 2 months ago. She denies any injury or trauma. Describes pain as a sensation of pressure in the back of her head. Headache lasts about 20 minutes after taking ibuprofen and goes away the rest of the day. She has headaches 2-3 a week. She does not drink caffeine. She does not smoke. She has never seen an Ophthalmologist and is endorsing some difficulty with vision (blurriness). Referral has been placed. She feels her headaches are some what related to her erratic sleep patterns. Takes her several hours to fall asleep sometimes.  She has tried taking melatonin. Sleep time ranges from 2-8 hours a night. The 8 hours is not often. She does not work out late at night, she does not work night shift and she does not watch tv at night.  Flatus Increased over the past few weeks. She has not changed her diet or started any new medications. Has taken Gas X with little relief. Denies Lactose intolerance. I suggested she try OTC probiotics to see if this will help provide relief. She denies N/V or epigastric pain.    Menorrhagia with IUD She would like to have it removed. Inserted 3 years ago. She has the copper IUD. Endorses symptoms of painful sexual  intercourse, lower pelvic pain and frequent irregular bleeding. There is no menorrhagia. Will refer to GYN for options. Also needs PAP. Will schedule at next appointment.       Review of Systems  Constitutional: Negative for fever, malaise/fatigue and weight loss.  HENT: Negative.  Negative for nosebleeds.   Eyes: Positive for blurred vision. Negative for double vision, photophobia, pain, discharge and redness.  Respiratory: Negative.  Negative for cough and shortness of breath.   Cardiovascular: Negative.   Negative for chest pain, palpitations and leg swelling.  Gastrointestinal: Negative.  Negative for abdominal pain, constipation, diarrhea, heartburn, nausea and vomiting.  Genitourinary:       SEE HPI  Musculoskeletal: Negative.  Negative for myalgias.  Neurological: Positive for headaches. Negative for dizziness, focal weakness and seizures.  Endo/Heme/Allergies: Negative for environmental allergies.  Psychiatric/Behavioral: Negative for suicidal ideas. The patient has insomnia.     History reviewed. No pertinent past medical history.  History reviewed. No pertinent surgical history.  Family History  Problem Relation Age of Onset  . Diabetes Mother     Social History Reviewed with no changes to be made today.   Outpatient Medications Prior to Visit  Medication Sig Dispense Refill  . ibuprofen (ADVIL,MOTRIN) 600 MG tablet Take 1 tablet (600 mg total) by mouth every 8 (eight) hours as needed. 30 tablet 0  . ferrous sulfate 325 (65 FE) MG tablet Take 1 tablet (325 mg total) by mouth daily with breakfast. 90 tablet 3  . Vitamin D, Ergocalciferol, (DRISDOL) 50000 units CAPS capsule Take 1 capsule (50,000 Units total) by mouth every 7 (seven) days. 16 capsule 0  . acyclovir (ZOVIRAX) 400 MG tablet Take 1 tablet (400 mg total) by mouth 5 (five) times daily. (Patient not taking: Reported on 08/15/2017) 35 tablet 0  . fluticasone (FLONASE) 50 MCG/ACT nasal spray Place 2 sprays into both nostrils daily. (Patient not taking: Reported on 11/09/2017) 16 g 6   No facility-administered medications prior to visit.     No Known Allergies     Objective:    BP 114/74 (BP Location: Left Arm, Patient Position: Sitting, Cuff Size: Normal)   Pulse (!) 59   Temp 98.4 F (36.9 C) (Oral)   Ht 5\' 7"  (1.702 m)   Wt 158 lb 9.6 oz (71.9 kg)   LMP 10/26/2017   SpO2 100%   BMI 24.84 kg/m  Wt Readings from Last 3 Encounters:  11/09/17 158 lb 9.6 oz (71.9 kg)  10/05/17 159 lb 3.2 oz (72.2 kg)    08/15/17 160 lb 9.6 oz (72.8 kg)    Physical Exam  Constitutional: She is oriented to person, place, and time. She appears well-developed and well-nourished. She is cooperative.  HENT:  Head: Normocephalic and atraumatic.  Eyes: Conjunctivae and EOM are normal. Pupils are equal, round, and reactive to light.  Neck: Normal range of motion.  Cardiovascular: Normal rate, regular rhythm, normal heart sounds and intact distal pulses. Exam reveals no gallop and no friction rub.  No murmur heard. Pulmonary/Chest: Effort normal and breath sounds normal. No tachypnea. No respiratory distress. She has no decreased breath sounds. She has no wheezes. She has no rhonchi. She has no rales. She exhibits no tenderness.  Abdominal: Soft. Bowel sounds are increased.  Musculoskeletal: Normal range of motion. She exhibits no edema.  Neurological: She is alert and oriented to person, place, and time. She has normal strength. No cranial nerve deficit or sensory deficit. Coordination and gait normal.  Skin: Skin is  warm and dry.  Psychiatric: She has a normal mood and affect. Her behavior is normal. Judgment and thought content normal.  Nursing note and vitals reviewed.      Patient has been counseled extensively about nutrition and exercise as well as the importance of adherence with medications and regular follow-up. The patient was given clear instructions to go to ER or return to medical center if symptoms don't improve, worsen or new problems develop. The patient verbalized understanding.   Follow-up: Return in about 2 weeks (around 11/23/2017) for f/u insomnia and headaches .   Gildardo Pounds, FNP-BC [facility] and [place], [place]   11/09/2017, 1:37 PM

## 2017-11-09 NOTE — Patient Instructions (Addendum)
Insomnio  (Insomnia)  El insomnio es un trastorno del sueo que causa dificultades para conciliar el sueo o para mantenerlo. Puede producir cansancio (fatiga), falta de energa, dificultad para concentrarse, cambios en el estado de nimo y mal rendimiento escolar o laboral.  Hay tres formas diferentes de clasificar el insomnio:   Dificultad para conciliar el sueo.   Dificultad para mantener el sueo.   Despertar muy precoz por la maana.  Cualquier tipo de insomnio puede ser a largo plazo (crnico) o a corto plazo (agudo). Ambos son frecuentes. Generalmente, el insomnio a corto plazo dura tres meses o menos tiempo. El crnico ocurre al menos tres veces por semana durante ms de tres meses.  CAUSAS  El insomnio puede deberse a otra afeccin, situacin o sustancia, por ejemplo:   Ansiedad.   Algunos medicamentos.   Enfermedad por reflujo gastroesofgico (ERGE) u otras enfermedades gastrointestinales.   Asma y otras enfermedades respiratorias.   Sndrome de las piernas inquietas, apnea del sueo u otros trastornos del sueo.   Dolor crnico.   Menopausia, que puede incluir calores repentinos.   Ictus.   Consumo excesivo de alcohol, tabaco u drogas ilegales.   Depresin.   Cafena.   Trastornos neurolgicos, como enfermedad de Alzheimer.   Hiperactividad tiroidea (hipertiroidismo).  Es posible que la causa del insomnio no se conozca.  FACTORES DE RIESGO  Los factores de riesgo de tener insomnio incluyen lo siguiente:   El sexo. La mujeres se ven ms afectadas que los hombres.   La edad. El insomnio es ms frecuente a medida que una persona envejece.   El estrs. Esto puede incluir su vida profesional o personal.   Los ingresos. El insomnio es ms frecuente en las personas cuyos ingresos son ms bajos.   La falta de actividad fsica.   Los horarios de trabajo irregulares o los turnos nocturnos.   Los viajes a lugares de diferentes zonas horarias.  SIGNOS Y SNTOMAS  Si tiene insomnio, el sntoma  principal es la dificultad para conciliar el sueo o mantenerlo. Esto puede derivar en otros sntomas, por ejemplo:   Sentirse fatigado.   Ponerse nervioso por tener que irse a dormir.   No sentirse descansado por la maana.   Tener dificultad para concentrarse.   Sentirse irritable, ansioso o deprimido.  TRATAMIENTO  El tratamiento para el insomnio depende de la causa. Si se debe a una enfermedad preexistente, el tratamiento se centrar en el abordaje de la enfermedad. El tratamiento tambin puede incluir lo siguiente:   Medicamentos que lo ayuden a dormir.   Asesoramiento psicolgico o terapia.   Cambios en el estilo de vida.  INSTRUCCIONES PARA EL CUIDADO EN EL HOGAR   Tome los medicamentos solamente como se lo haya indicado el mdico.   Establezca horarios habituales para dormir y despertarse. No tome siestas.   Lleve un registro del sueo ya que podra ser de utilidad para que usted y a su mdico puedan determinar qu podra estar causndole insomnio. Incluya lo siguiente:  ? Cundo duerme.  ? Cundo se despierta durante la noche.  ? Qu tan bien duerme.  ? Qu tan relajado se siente al da siguiente.  ? Cualquier efecto secundario de los medicamentos que toma.  ? Lo que usted come y bebe.   Convierta a su habitacin en un lugar cmodo donde sea fcil conciliar el sueo:  ? Coloque persianas o cortinas especiales oscuras que impidan la entrada de la luz del exterior.  ? Para bloquear los ruidos, use   usted. Estos pueden incluir lo siguiente: ? Ejercicios de respiracin. ? Rutinas para aliviar la tensin muscular. ? Visualizacin de escenas  apacibles.  Disminucin del consumo de alcohol, bebidas con cafena y cigarrillos, especialmente cerca de la hora de [place], ya que pueden perturbarle el sueo.  No coma en exceso ni consuma comidas picantes justo antes de la hora de [place]. Esto puede causarle molestias digestivas y dificultades para dormir.  Limite el uso de pantallas antes de la hora de [place]. Esto incluye lo siguiente: ? Mirar televisin. ? Usar el telfono inteligente, la tableta y la computadora.  Siga una rutina. Esto puede ayudarlo a conciliar el sueo ms rpidamente. Intente hacer una actividad tranquila, cepillarse los dientes e irse a la cama a la misma hora todas las noches.  Levntese de la cama si sigue despierto despus de 47minutos de haber intentado dormirse. Leon luces, pero intente leer o hacer una actividad tranquila. Cuando tenga sueo, regrese a Futures trader.  Conduzca con cuidado. No conduzca si est muy somnoliento.  Concurra a todas las visitas de control, segn le indique su mdico. Esto es importante. SOLICITE ATENCIN MDICA SI:  Est cansado durante el da o tiene dificultades en su rutina diaria debido a la somnolencia.  Sigue teniendo problemas para dormir o Press photographer. SOLICITE ATENCIN MDICA DE INMEDIATO SI:  Tiene pensamientos serios acerca de lastimarse a usted mismo o daar a Nurse, children's. Esta informacin no tiene Marine scientist el consejo del mdico. Asegrese de hacerle al mdico cualquier pregunta que tenga. Document Released: 09/27/2005 Document Revised: 10/18/2014 Document Reviewed: 06/28/2014 Elsevier Interactive Patient Education  2018 [place] en brotes (Cluster Headache) La cefalea en brotes se reconoce por su patrn de dolor de cabeza intenso y profundo. Normalmente se produce en un lado de la cabeza, pero puede "cambiar de lado" en los siguientes episodios. Tpicamente, la cefalea en brotes:   Es intensa por naturaleza.    Ocurre repetidas veces durante semanas o meses y es seguida por perodos sin cefalea.   Puede durar entre 15 minutos y 3 horas.   Aparece en el mismo momento del da, generalmente por la noche.   Ocurre varias veces por da. CAUSAS En la [employer] de los casos se desconoce la causa exacta. El consumo de alcohol puede estar asociado a estas cefaleas. SIGNOS Y SNTOMAS   Dolor intenso que comienza en el ojo o alrededor del ojo o en la sien.   Dolor en un lado de la cabeza.   Ganas de vomitar (nuseas).   Sensibilidad a Naval architect.   Secrecin nasal.   Ojos rojos, lagrimeo y secrecin nasal del lado de la cabeza en el que siente el dolor.   La piel del rostro est plida y sudorosa.   Prpados cados o hinchados.   Agitacin. DIAGNSTICO  El diagnstico se realiza segn los sntomas y el examen fsico. El mdico podr indicar una tomografa computada o una resonancia magntica de la cabeza, o anlisis de laboratorio para ver si la causa de los dolores de cabeza es otra enfermedad.  TRATAMIENTO   Le recetarn analgsicos y medicamentos para prevenir ataques recurrentes. Algunas personas necesitarn una combinacin de medicamentos.  Oxgeno para Best boy.   Los programas de biorretroalimentacin pueden ayudar a Therapist, occupational.  Puede ser til llevar un diario de las cefaleas. Esto podr ayudar a encontrar un factor comn que pueda desencadenar las cefaleas. El mdico podr Actor un plan de tratamiento.  INSTRUCCIONES PARA  EL CUIDADO EN EL HOGAR  Durante los brotes:   Siga un patrn de sueo regular. No vare la cantidad de tiempo que duerme de un da para Lexicographer. Es importante que siga el mismo patrn durante el perodo de brotes para Information systems manager de [place].   Evite el alcohol.   Si fuma, abandone el hbito.  SOLICITE ATENCIN MDICA SI:  Observa cambios desde sus primeras cefaleas en intensidad o frecuencia.   No tiene [employer] toma.  SOLICITE ATENCIN MDICA DE INMEDIATO SI:   Se desmaya.   Siente debilidad o adormecimiento, especialmente en un lado del cuerpo o el rostro.   Tiene visin doble.   Siente nuseas o vmitos que no se alivian en algunas horas.   No puede mantener el equilibrio o tiene dificultad para hablar o caminar.   Siente dolor o rigidez en el cuello.   Tiene fiebre. ASEGRESE DE QUE:  Comprende estas instrucciones.   Controlar su afeccin.   Recibir ayuda de inmediato si no mejora o si empeora. Esta informacin no tiene Marine scientist el consejo del mdico. Asegrese de hacerle al mdico cualquier pregunta que tenga. Document Released: 07/07/2005 Document Revised: 07/18/2013 Elsevier Interactive Patient Education  2017 [place] de rebote por consumo excesivo de analgsicos (Analgesic Rebound Headache) Una cefalea de rebote por consumo excesivo de analgsicos, a veces llamada cefalea por abuso de medicamentos, es un dolor de cabeza que se manifiesta despus de que desaparece el efecto de los analgsicos tomados para tratar el dolor de cabeza original (primario). Cualquier tipo de dolor de cabeza primario puede regresar como una cefalea de rebote si una persona toma analgsicos regularmente ms de tres veces por semana para tratarlo. Los tipos de Social research officer, government de cabeza primario que se suelen TEFL teacher a las cefaleas de rebote incluyen:  Migraas.  Dolores de cabeza que surgen por una tensin muscular en la zona de la cabeza y del cuello (cefaleas tensionales).  Dolores de cabeza que aparecen y vuelven a Arts administrator (recurrentes) de un lado de la cabeza y alrededor del ojo (cefaleas en racimos). Si las cefaleas de rebote continan, estas se convierten en dolores de cabeza diarios crnicos. CAUSAS Esta afeccin puede ser causada por el uso frecuente de:  Medicamentos de venta libre, como aspirina, ibuprofeno y paracetamol.  Antisinusticos y otros  medicamentos que contienen cafena.  Analgsicos opiceos, como codena y [place]. SNTOMAS Los sntomas de la cefalea de rebote son los mismos que los del dolor de [place] original. Algunos de los sntomas de tipos especficos de cefaleas incluyen lo siguiente: Consulting civil engineer  Dolor pulstil e intermitente en uno o ambos lados de la cabeza.  Dolor intenso que dificulta las actividades cotidianas.  Dolor que empeora con la actividad fsica.  Nuseas, vmitos o ambos.  Dolor ante la exposicin a luces brillantes, ruidos fuertes o aromas intensos.  Sensibilidad general a las luces brillantes, a los ruidos fuertes o a los aromas intensos.  Cambios en la visin.  Adormecimiento de uno o [employer]. Cefalea tensional.  Presin alrededor de la cabeza.  Dolor "sordo" en la cabeza.  Dolor que siente sobre la frente y los lados de la cabeza.  Sensibilidad en los msculos de la cabeza, del cuello y de los hombros. [place] en racimos  Dolor intenso que comienza en un ojo, alrededor de un ojo o en la sien.  Enrojecimiento y lagrimeo del ojo del mismo lado del dolor.  Prpados cados o hinchados.  Dolor  en un lado de la cabeza.  Nuseas.  Secrecin nasal.  Piel del rostro sudorosa y plida.  Agitacin. DIAGNSTICO Esta afeccin se diagnostica mediante:  Una revisin de sus antecedentes mdicos. Esto incluye la naturaleza de sus dolores de cabeza primarios.  Una revisin de los tipos de analgsicos que ha estado tomando para tratar las cefaleas y la frecuencia con que los toma. TRATAMIENTO Esta afeccin se puede tratar de la siguiente manera:  Interrumpir el uso frecuente de los analgsicos. Al principio, esto puede [employer] cefaleas, pero, con el tiempo, el dolor debe volverse ms controlable, menos frecuente y [employer].  Consultar a un especialista en cefaleas. Tal vez este profesional pueda ayudarlo a Chief Technology Officer las cefaleas y ayudar a Programmer, multimedia que estas  no tengan otra causa.  Emplear mtodos alternativos de alivio del estrs, como la acupuntura, la psicoterapia, la biorregulacin y los masajes tambin pueden ayudar. Hable con el mdico respecto de qu mtodo podra ser adecuado para su caso. [place] los medicamentos de venta libre y los recetados solamente como se lo haya indicado el mdico.  Interrumpa el uso repetido de los [place], como se lo haya indicado el mdico. Tal interrupcin puede ser difcil. Siga atentamente las indicaciones del mdico.  Evite los factores desencadenantes que se sabe le causan las cefaleas primarias.  Concurra a todas las visitas de control como se lo haya indicado el mdico. Esto es importante. SOLICITE ATENCIN MDICA SI:  Sigue teniendo cefaleas despus de Optometrist los tratamientos que el mdico recomend. SOLICITE ATENCIN MDICA DE INMEDIATO SI:  Tiene nuevas cefaleas.  Tiene cefaleas que son diferentes de las que tuvo en el pasado.  Tiene adormecimiento u hormigueos en los brazos o las piernas.  Tiene cambios en la visin o en el habla. Esta informacin no tiene Marine scientist el consejo del mdico. Asegrese de hacerle al mdico cualquier pregunta que tenga. Document Released: 12/24/2008 Document Revised: 10/18/2014 Document Reviewed: 03/01/2016 Elsevier Interactive Patient Education  2018 [place] general sin causa (General Headache Without Cause) El dolor de cabeza es un dolor o Tree surgeon que se siente en la zona de la cabeza o del cuello. Hay muchas causas y tipos de dolores de [place]. En algunos casos, es posible que no se encuentre la causa. CUIDADOS EN EL HOGAR Control del [employer] de venta libre y los recetados solamente como se lo haya indicado el mdico.  Cuando sienta dolor de cabeza acustese en un cuarto oscuro y tranquilo.  Si se lo indican, aplique hielo sobre la cabeza y la zona del  cuello: ? Ponga el hielo en una bolsa plstica. ? Coloque una [employer] piel y la bolsa de hielo. ? Coloque el hielo durante 2minutos, 2 a 3veces por da.  Utilice una almohadilla trmica o tome una ducha con agua caliente para aplicar calor en la cabeza y la zona del cuello como se lo haya indicado el [place] luces tenues si le [employer] luces brillantes o sus dolores de cabeza empeoran. Comida y bebida  Mantenga un horario para las comidas.  Beba menos alcohol.  Consuma menos o deje de tomar cafena. Instrucciones generales  Concurra a todas las visitas de control como se lo haya indicado el mdico. Esto es importante.  Lleve un registro diario para Neurosurgeon si ciertas cosas provocan los dolores de [place]. Por ejemplo, escriba los siguientes datos: ? Lo que usted  come y bebe. ? Cunto tiempo duerme. ? Algn cambio en su dieta o en los medicamentos.  Realice actividades relajantes, como recibir [place].  Disminuya el nivel de estrs.  Sintese con la espalda recta. No contraiga (tensione) los msculos.  No consuma productos que contengan tabaco. Estos incluyen cigarrillos, tabaco para mascar y Psychologist, sport and exercise. Si necesita ayuda para dejar de fumar, consulte al mdico.  Haga ejercicios con regularidad tal como se lo indic el mdico.  Duerma lo suficiente. Esto a menudo significa entre 7 y 9horas de sueo. SOLICITE AYUDA SI:  Los medicamentos no logran [employer].  Tiene un dolor de cabeza que es diferente a los otros dolores de [place].  Tiene malestar estomacal (nuseas) o vomita.  Tiene fiebre. SOLICITE AYUDA DE INMEDIATO SI:  El dolor de [place].  Sigue vomitando.  Presenta rigidez en el cuello.  Tiene dificultad para ver.  Tiene dificultad para hablar.  Siente dolor en el ojo o en el odo.  Sus msculos estn dbiles, o pierde el control muscular.  Pierde el equilibrio o tiene problemas para  Writer.  Siente que se desvanece (pierde el conocimiento) o se desmaya.  Se siente confundido. Esta informacin no tiene Marine scientist el consejo del mdico. Asegrese de hacerle al mdico cualquier pregunta que tenga. Document Released: 12/20/2011 Document Revised: 06/18/2015 Document Reviewed: 01/20/2015 Elsevier Interactive Patient Education  2018 [place] (Migraine Headache) Domingo Cocking migraosa es un dolor intenso y punzante en uno o ambos lados de la cabeza. Las migraas tambin pueden causar otros sntomas, como nuseas, vmitos y sensibilidad a la luz y el ruido. CAUSAS Hay ciertos factores que pueden provocar migraas, como los siguientes:  Alcohol.  Fumar.  Medicamentos, por ejemplo: ? Medicamentos para Best boy torcico (nitroglicerina). ? Pldoras anticonceptivas. ? Comprimidos de estrgeno. ? Ciertos medicamentos para la presin arterial.  Quesos curados, chocolate o cafena.  Los alimentos o las bebidas que contienen nitratos, glutamato, aspartamo o tiramina.  Realizar actividad fsica. Otros factores que pueden provocar migraa incluyen los siguientes:  Menstruacin.  Embarazo.  Hambre.  Estrs, poco o demasiado sueo, o fatiga.  Cambios climticos. [place] factores pueden hacer que usted sea ms propenso a tener migraas:  La edad. Los riesgos aumentan con la edad.  Antecedentes familiares de migraa.  Ser de Science writer.  Depresin y ansiedad.  Obesidad.  Ser mujer.  Tener un agujero en el corazn (persistencia del agujero oval) u otros problemas cardacos. SNTOMAS El principal sntoma de esta afeccin es el dolor intenso y punzante. El dolor:  Puede aparecer en cualquier regin de la cabeza, tanto de un lado como de [place].  Puede interferir con las actividades de la vida cotidiana.  Puede empeorar con la actividad fsica.  Puede empeorar ante la exposicin a luces  brillantes o a ruidos fuertes. Otros sntomas pueden incluir lo siguiente:  Nuseas.  Vmitos.  Mareos.  Sensibilidad general a las luces brillantes, a los ruidos fuertes o a los [employer]. Antes de sufrir una migraa, puede percibir seales de advertencia (aura). Un aura puede incluir:  Ver luces intermitentes o tener puntos ciegos.  Ver puntos brillantes, halos o lneas en zigzag.  Tener una visin en tnel o visin borrosa.  Sentir entumecimiento u hormigueo.  Tener dificultad para hablar.  Debilidad muscular. DIAGNSTICO La cefalea migraosa se diagnostica en funcin de lo siguiente:  Sus sntomas.  Un examen fsico.  Estudios, como una tomografa computarizada o una resonancia  magntica de la cabeza. Estos estudios de diagnstico por imgenes pueden ayudar a Freight forwarder causas de cefalea.  Truddie Coco de lquido cefalorraqudeo (puncin lumbar) para Physiological scientist (anlisis de lquido cefalorraqudeo o anlisis de LCR). TRATAMIENTO Las cefaleas migraosas suelen tratarse con medicamentos que:  Financial trader.  [place] nuseas.  Evitan la recurrencia de las migraas. El tratamiento tambin puede incluir lo siguiente:  Acupuntura.  Cambios en el estilo de vida, como evitar alimentos que provoquen [place]. [place] los medicamentos de venta libre y los recetados solamente como se lo haya indicado el mdico.  No conduzca ni use maquinaria pesada mientras toma analgsicos recetados.  A fin de prevenir o tratar el estreimiento mientras toma analgsicos recetados, el mdico puede recomendarle lo siguiente: ? Beba suficiente lquido para mantener la orina clara o de color amarillo plido. ? Tomar medicamentos recetados o de [employer]. ? Consumir alimentos ricos en fibra, como frutas y verduras frescas, cereales integrales y frijoles. ? Limitar el consumo de alimentos con alto contenido de grasas y azcares  procesados, como alimentos fritos o dulces. Estilo de vida  Evite el consumo de alcohol.  No consuma ningn producto que contenga nicotina o tabaco, como cigarrillos y Psychologist, sport and exercise. Si necesita ayuda para dejar de fumar, consulte al mdico.  Duerma como mnimo 8horas todas las noches.  Evite las situaciones de estrs. Instrucciones generales  Lleve un registro diario para Neurosurgeon lo que Financial trader. Por ejemplo, escriba: ? Lo que usted come y bebe. ? Cunto tiempo duerme. ? Algn cambio en su dieta o en los medicamentos.  Si tiene una migraa: ? Evite los factores que [employer] sntomas, como las luces brillantes. ? Resulta til acostarse en una habitacin oscura y silenciosa. ? No conduzca vehculos ni opere maquinaria pesada. ? Pregntele al mdico qu actividades son seguras para usted cuando tiene sntomas.  Concurra a todas las visitas de control como se lo haya indicado el mdico. Esto es importante. SOLICITE ATENCIN MDICA SI:  Tiene sntomas de migraa distintos o ms intensos que los habituales.  SOLICITE ATENCIN MDICA DE INMEDIATO SI:  La migraa se hace cada vez ms intensa.  Tiene fiebre.  Presenta rigidez en el cuello.  Tiene prdida de visin.  Siente debilidad en los msculos o que no puede controlarlos.  Comienza a perder el equilibrio con frecuencia.  Comienza a tener dificultades para caminar.  Se desmaya.  Esta informacin no tiene Marine scientist el consejo del mdico. Asegrese de hacerle al mdico cualquier pregunta que tenga. Document Released: 09/27/2005 Document Revised: 07/18/2013 Document Reviewed: 03/15/2016 Elsevier Interactive Patient Education  2017 [place] tensional (Tension Headache) Una cefalea tensional es un dolor o presin que se siente en la frente y los lados de la cabeza. Estas cefaleas pueden durar de 47minutos a varios das. CUIDADOS EN EL HOGAR Control  del [employer] de venta libre y los recetados solamente como se lo haya indicado el mdico.  Cuando sienta dolor de cabeza acustese en un cuarto oscuro y tranquilo.  Si se lo indican, aplquese hielo en la zona de la cabeza y el cuello: ? Ponga el hielo en una bolsa plstica. ? Coloque una [employer] piel y la bolsa de hielo. ? Coloque el hielo durante 21minutos, 2 a 3veces por da.  Utilice una almohadilla trmica o tome una ducha caliente para aplicar calor en la zona de la  cabeza y el cuello segn las indicaciones del mdico. Comida y bebida  Mantenga un horario para las comidas.  No beba mucho alcohol.  No consuma mucha cafena ni deje de consumirla por completo. Instrucciones generales  Concurra a todas las visitas de control como se lo haya indicado el mdico. Esto es importante.  Lleve un registro diario para Neurosurgeon si ciertas cosas provocan los dolores de [place]. Por ejemplo, escriba los siguientes datos: ? Lo que usted come y bebe. ? Cunto tiempo duerme. ? Algn cambio en su dieta o en los medicamentos.  Pruebe recibir Engineer, production o hacer otras cosas que lo ayuden a Nurse, children's.  Disminuya el nivel de estrs.  Sintese con la espalda recta. No contraiga (tensione) los msculos.  No consuma productos que contengan tabaco. Estos incluyen cigarrillos, tabaco para mascar y Psychologist, sport and exercise. Si necesita ayuda para dejar de fumar, consulte al mdico.  Haga ejercicios con regularidad tal como se lo indic el mdico.  Duerma lo suficiente. Es Software engineer, entre 7 y 9horas de sueo. SOLICITE AYUDA SI:  Los medicamentos no logran [employer].  Tiene un dolor de cabeza que es diferente del dolor de cabeza habitual.  Tiene Tree surgeon estomacal (nuseas) o vomita.  Tiene fiebre. SOLICITE AYUDA DE INMEDIATO SI:  La cefalea es muy intensa.  Sigue vomitando.  Presenta rigidez en el cuello.  Tiene dificultad para ver.  Tiene  dificultad para hablar.  Siente dolor en el ojo o en el odo.  Sus msculos estn dbiles, o pierde el control muscular.  Pierde el equilibrio o tiene problemas para Writer.  Siente que se desvanece (pierde el conocimiento) o se desmaya.  Se siente confundido. Esta informacin no tiene Marine scientist el consejo del mdico. Asegrese de hacerle al mdico cualquier pregunta que tenga. Document Released: 12/20/2011 Document Revised: 06/18/2015 Document Reviewed: 01/20/2015 Elsevier Interactive Patient Education  [employer].

## 2017-11-30 ENCOUNTER — Ambulatory Visit: Payer: Self-pay | Attending: Nurse Practitioner | Admitting: Nurse Practitioner

## 2017-11-30 ENCOUNTER — Encounter: Payer: Self-pay | Admitting: Nurse Practitioner

## 2017-11-30 VITALS — BP 120/78 | HR 99 | Temp 98.4°F | Ht 67.0 in | Wt 156.8 lb

## 2017-11-30 DIAGNOSIS — N939 Abnormal uterine and vaginal bleeding, unspecified: Secondary | ICD-10-CM | POA: Insufficient documentation

## 2017-11-30 DIAGNOSIS — R519 Headache, unspecified: Secondary | ICD-10-CM

## 2017-11-30 DIAGNOSIS — Z0000 Encounter for general adult medical examination without abnormal findings: Secondary | ICD-10-CM

## 2017-11-30 DIAGNOSIS — R51 Headache: Secondary | ICD-10-CM | POA: Insufficient documentation

## 2017-11-30 DIAGNOSIS — E559 Vitamin D deficiency, unspecified: Secondary | ICD-10-CM | POA: Insufficient documentation

## 2017-11-30 DIAGNOSIS — Z79899 Other long term (current) drug therapy: Secondary | ICD-10-CM | POA: Insufficient documentation

## 2017-11-30 DIAGNOSIS — R6889 Other general symptoms and signs: Secondary | ICD-10-CM

## 2017-11-30 DIAGNOSIS — G4709 Other insomnia: Secondary | ICD-10-CM

## 2017-11-30 DIAGNOSIS — G4700 Insomnia, unspecified: Secondary | ICD-10-CM | POA: Insufficient documentation

## 2017-11-30 MED ORDER — VITAMIN D (ERGOCALCIFEROL) 1.25 MG (50000 UNIT) PO CAPS: 50000.0000 [IU] | ORAL_CAPSULE | ORAL | 0 refills | Status: DC

## 2017-11-30 MED ORDER — IBUPROFEN 600 MG PO TABS: 600.0000 mg | ORAL_TABLET | Freq: Three times a day (TID) | ORAL | 1 refills | Status: DC | PRN

## 2017-11-30 MED ORDER — TRAZODONE HCL 50 MG PO TABS: 50.0000 mg | ORAL_TABLET | Freq: Every evening | ORAL | 1 refills | Status: DC | PRN

## 2017-11-30 NOTE — Patient Instructions (Addendum)
General Headache Without Cause A headache is pain or discomfort felt around the head or neck area. The specific cause of a headache may not be found. There are many causes and types of headaches. A few common ones are:  Tension headaches.  Migraine headaches.  Cluster headaches.  Chronic daily headaches.  Follow these instructions at home: Watch your condition for any changes. Take these steps to help with your condition: Managing pain  Take over-the-counter and prescription medicines only as told by your health care provider.  Lie down in a dark, quiet room when you have a headache.  If directed, apply ice to the head and neck area: ? Put ice in a plastic bag. ? Place a towel between your skin and the bag. ? Leave the ice on for 20 minutes, 2-3 times per day.  Use a heating pad or hot shower to apply heat to the head and neck area as told by your health care provider.  Keep lights dim if bright lights bother you or make your headaches worse. Eating and drinking  Eat meals on a regular schedule.  Limit alcohol use.  Decrease the amount of caffeine you drink, or stop drinking caffeine. General instructions  Keep all follow-up visits as told by your health care provider. This is important.  Keep a headache journal to help find out what may trigger your headaches. For example, write down: ? What you eat and drink. ? How much sleep you get. ? Any change to your diet or medicines.  Try massage or other relaxation techniques.  Limit stress.  Sit up straight, and do not tense your muscles.  Do not use tobacco products, including cigarettes, chewing tobacco, or e-cigarettes. If you need help quitting, ask your health care provider.  Exercise regularly as told by your health care provider.  Sleep on a regular schedule. Get 7-9 hours of sleep, or the amount recommended by your health care provider. Contact a health care provider if:  Your symptoms are not helped by  medicine.  You have a headache that is different from the usual headache.  You have nausea or you vomit.  You have a fever. Get help right away if:  Your headache becomes severe.  You have repeated vomiting.  You have a stiff neck.  You have a loss of vision.  You have problems with speech.  You have pain in the eye or ear.  You have muscular weakness or loss of muscle control.  You lose your balance or have trouble walking.  You feel faint or pass out.  You have confusion. This information is not intended to replace advice given to you by your health care provider. Make sure you discuss any questions you have with your health care provider. Document Released: 09/27/2005 Document Revised: 03/04/2016 Document Reviewed: 01/20/2015 Elsevier Interactive Patient Education  2018 [place] general sin causa General Headache Without Cause El dolor de cabeza es un dolor o Tree surgeon que se siente en la zona de la cabeza o del cuello. Puede no tener una causa especfica. Hay muchas causas y tipos de dolores de [place]. Los dolores de cabeza ms comunes son los siguientes:  Cefalea tensional.  Cefaleas migraosas.  Cefalea en brotes.  Cefaleas diarias crnicas.  Siga estas indicaciones en su casa:  Controle su afeccin para detectar cualquier cambio. Siga estos pasos para Chief Technology Officer su afeccin: Control del [employer] de venta libre y los recetados solamente como se  lo haya indicado el mdico.  Cuando sienta dolor de cabeza acustese en un cuarto oscuro y tranquilo.  Si se lo indican, aplique hielo sobre la cabeza y la zona del cuello: ? Ponga el hielo en una bolsa plstica. ? Coloque una [employer] piel y la bolsa de hielo. ? Deje el hielo durante 44minutos, 2 a 3veces por da.  Utilice una almohadilla trmica o tome una ducha con agua caliente para aplicar calor en la cabeza y la zona del cuello como se lo haya indicado el  [place] luces tenues si las luces brillantes le [place] o sus dolores de cabeza empeoran. Qu debe comer y beber  [place] un horario para las comidas.  Limite el consumo de bebidas alcohlicas.  Consuma menos cantidad de cafena o deje de tomarla. Instrucciones generales  Concurra a todas las visitas de seguimiento como se lo haya indicado el mdico. Esto es importante.  Lleve un diario de los dolores de cabeza para Neurosurgeon qu factores pueden desencadenarlos. Por ejemplo, escriba: ? Lo que usted come y bebe. ? Cunto tiempo duerme. ? Algn cambio en su dieta o en los medicamentos.  Pruebe algunas tcnicas de relajacin, como los [place].  Limite el estrs.  Sintese con la espalda recta y no tense los msculos.  No consuma productos que contengan tabaco, incluidos cigarrillos, tabaco de Higher education careers adviser o cigarrillos electrnicos. Si necesita ayuda para dejar de fumar, consulte al mdico.  Haga actividad fsica habitualmente como se lo haya indicado el mdico.  Tenga un horario fijo para dormir. Duerma entre 7 y 9horas o la cantidad de horas que le haya recomendado el mdico. Comunquese con un mdico si:  Los medicamentos no Dealer los sntomas.  Tiene un dolor de cabeza que es diferente del dolor de cabeza habitual.  Tiene nuseas o vmitos.  Tiene fiebre. Solicite ayuda de inmediato si:  El dolor se vuelve cada vez ms intenso.  Ha vomitado repetidas veces.  Presenta rigidez en el cuello.  Sufre prdida de la visin.  Tiene problemas para hablar.  Siente dolor en el ojo o en el odo.  Presenta debilidad muscular o prdida del control muscular.  Pierde el equilibrio o tiene problemas para Writer.  Sufre mareos o se desmaya.  Experimenta confusin. Esta informacin no tiene Marine scientist el consejo del mdico. Asegrese de hacerle al mdico cualquier pregunta que tenga. Document Released: 07/07/2005 Document Revised: 01/17/2017  Document Reviewed: 01/20/2015 Elsevier Interactive Patient Education  2018 [place] (Insomnia) El insomnio es un trastorno del sueo que causa dificultades para conciliar el sueo o para [place]. Puede producir cansancio (fatiga), falta de energa, dificultad para concentrarse, cambios en el estado de nimo y mal rendimiento escolar o laboral. Hay tres formas diferentes de clasificar el insomnio:  Dificultad para conciliar el sueo.  Dificultad para mantener el sueo.  Despertar muy precoz por la maana. Cualquier tipo de insomnio puede ser a Barrister's clerk (crnico) o a Control and instrumentation engineer (agudo). Ambos son frecuentes. Generalmente, el insomnio a corto plazo dura tres meses o menos tiempo. El crnico ocurre al menos tres veces por semana durante ms de tres meses. CAUSAS El insomnio puede deberse a otra afeccin, situacin o sustancia, por ejemplo:  Ansiedad.  Algunos medicamentos.  Enfermedad por reflujo gastroesofgico (ERGE) u otras enfermedades gastrointestinales.  Asma y otras enfermedades respiratorias.  Sndrome de las piernas inquietas, apnea del sueo u otros trastornos del sueo.  Dolor crnico.  Menopausia, que puede incluir calores repentinos.  Ictus.  Consumo excesivo de alcohol, tabaco u drogas ilegales.  Depresin.  Cafena.  Trastornos neurolgicos, como enfermedad de Alzheimer.  Hiperactividad tiroidea (hipertiroidismo). Es posible que la causa del insomnio no se conozca. FACTORES DE RIESGO Los factores de riesgo de tener insomnio incluyen lo siguiente:  El sexo. La mujeres se ven ms afectadas que los hombres.  La edad. El insomnio es ms frecuente a medida que una persona envejece.  El estrs. Esto puede incluir su vida profesional o personal.  Los ingresos. El insomnio es ms frecuente en las personas cuyos ingresos son ms bajos.  La falta de actividad fsica.  Los horarios de trabajo irregulares o los turnos nocturnos.  Los  viajes a lugares de diferentes zonas horarias. SIGNOS Y SNTOMAS Si tiene insomnio, el sntoma principal es la dificultad para conciliar el sueo o mantenerlo. Esto puede derivar en otros sntomas, por ejemplo:  Sentirse fatigado.  Ponerse nervioso por [employer] irse a dormir.  No sentirse descansado por la maana.  Tener dificultad para concentrarse.  Sentirse irritable, ansioso o deprimido. TRATAMIENTO El tratamiento para el insomnio depende de la causa. Si se debe a una enfermedad preexistente, el tratamiento se centrar en el abordaje de la enfermedad. El tratamiento tambin puede incluir lo siguiente:  Medicamentos que lo ayuden a dormir.  Asesoramiento psicolgico o terapia.  Cambios en el estilo de vida. [place] los medicamentos solamente como se lo haya indicado el mdico.  Establezca horarios habituales para dormir y Clinical cytogeneticist. No tome siestas.  Lleve un registro del sueo ya que podra ser de utilidad para que usted y a su mdico puedan determinar qu podra estar causndole insomnio. Incluya lo siguiente: ? Cundo duerme. ? Cundo se despierta durante la noche. ? Qu tan bien duerme. ? Qu tan relajado se siente al da siguiente. ? Cualquier efecto secundario de los [employer] toma. ? Lo que usted come y bebe.  Convierta a su habitacin en un lugar cmodo donde sea fcil conciliar el sueo: ? Coloque persianas o cortinas especiales oscuras que impidan la entrada de la luz del exterior. ? Para bloquear los ruidos, use un aparato que reproduce sonidos ambientales o relajantes de fondo. ? Mantenga baja la temperatura.  Haga ejercicio regularmente como se lo haya indicado el mdico. No haga ejercicio justo antes de la hora de [place].  Utilice tcnicas de relajacin para controlar el estrs. Pdale al mdico que le sugiera algunas tcnicas que sean adecuadas para usted. Estos pueden incluir lo siguiente: ? Ejercicios  de respiracin. ? Rutinas para aliviar la tensin muscular. ? Visualizacin de escenas apacibles.  Disminucin del consumo de alcohol, bebidas con cafena y cigarrillos, especialmente cerca de la hora de [place], ya que pueden perturbarle el sueo.  No coma en exceso ni consuma comidas picantes justo antes de la hora de [place]. Esto puede causarle molestias digestivas y dificultades para dormir.  Limite el uso de pantallas antes de la hora de [place]. Esto incluye lo siguiente: ? Mirar televisin. ? Usar el telfono inteligente, la tableta y la computadora.  Siga una rutina. Esto puede ayudarlo a conciliar el sueo ms rpidamente. Intente hacer una actividad tranquila, cepillarse los dientes e irse a la cama a la misma hora todas las noches.  Levntese de la cama si sigue despierto despus de 53minutos de haber intentado dormirse. [place] luces, pero intente leer o hacer una actividad tranquila. Cuando tenga sueo, regrese a Futures trader.  Conduzca con  cuidado. No conduzca si est muy somnoliento.  Concurra a todas las visitas de control, segn le indique su mdico. Esto es importante. SOLICITE ATENCIN MDICA SI:  Est cansado durante el da o tiene dificultades en su rutina diaria debido a la somnolencia.  Sigue teniendo problemas para dormir o Press photographer. SOLICITE ATENCIN MDICA DE INMEDIATO SI:  Tiene pensamientos serios acerca de lastimarse a usted mismo o daar a Nurse, children's. Esta informacin no tiene Marine scientist el consejo del mdico. Asegrese de hacerle al mdico cualquier pregunta que tenga. Document Released: 09/27/2005 Document Revised: 10/18/2014 Document Reviewed: 06/28/2014 Elsevier Interactive Patient Education  2018 [place] uterinos (Uterine Fibroids) Los fibromas uterinos son masas (tumores) de tejido que pueden desarrollarse en el vientre (tero). Tambin se los [employer]. Este tipo de tumor no es Radio broadcast assistant  (benigno) y no se disemina a Airline pilot del cuerpo fuera de la zona plvica, la cual se encuentra entre los huesos de la cadera. En ocasiones, los fibromas pueden crecer en las trompas de [place], en el cuello del tero o en las estructuras de soporte (ligamentos) que rodean el tero. Una mujer puede tener uno o ms fibromas. Los fibromas pueden tener diferente tamao y [place], y crecer en distintas partes del tero. Algunos pueden crecer hasta volverse bastante grandes. La mayora no requiere tratamiento mdico. CAUSAS Un fibroma puede desarrollarse cuando una nica clula uterina contina creciendo (se multiplica). La mayora de las clulas del cuerpo humano tienen un mecanismo de control que impide que se multipliquen sin control. [place] los sntomas se pueden incluir los siguientes:  Hemorragias intensas durante la menstruacin.  Prdidas de sangre o [employer].  Dolor y opresin en la pelvis.  Problemas de la vejiga, como necesidad de Garment/textile technologist con ms frecuencia (polaquiuria) o necesidad imperiosa de Garment/textile technologist.  Incapacidad para reproducir (infertilidad).  Abortos espontneos. DIAGNSTICO Los fibromas uterinos se diagnostican con un examen fsico. El mdico puede palpar los tumores grumosos durante un examen plvico. Pueden realizarse ecografas y Ardelia Mems resonancia magntica para determinar el tamao y la ubicacin de los fibromas, as como la cantidad. TRATAMIENTO El tratamiento puede incluir lo siguiente:  Observacin cautelosa. Esto requiere que el mdico controle el fibroma para saber si crece o se achica. Siga las recomendaciones del mdico respecto de la frecuencia con la que debe realizarse los controles.  Medicamentos hormonales. Pueden tomarse por va oral o administrarse a travs de un dispositivo intrauterino (DIU).  [place]. ? Extirpacin de los fibromas (miomectoma) o del tero (histerectoma). ? Suprimir la irrigacin sangunea a los  fibromas (embolizacin de la arteria uterina). Si los fibromas le traen problemas de fertilidad y tiene deseos de quedar [place], el mdico puede recomendar su extirpacin. INSTRUCCIONES PARA EL CUIDADO EN EL HOGAR  Concurra a todas las visitas de control como se lo haya indicado el mdico. Esto es importante.  Tome los medicamentos de venta libre y los recetados solamente como se lo haya indicado el mdico. ? Si le recetaron un tratamiento hormonal, tome los medicamentos hormonales exactamente como se lo indicaron.  Consulte al [employer] sobre tomar comprimidos de hierro y Garment/textile technologist la cantidad de verduras de hoja color verde oscuro en la dieta. Estas medidas pueden ayudar a Transport planner de hierro en la [place], que pueden verse afectados por las hemorragias menstruales intensas.  Preste mucha atencin a la menstruacin e informe al mdico si hay algn cambio, por ejemplo: ? Aumento del flujo de [employer]  le exige el uso de ms compresas o tampones que los que [place] normalmente cada mes. ? Un cambio en la cantidad de [employer] dura la menstruacin cada mes. ? Un cambio en los sntomas asociados con la [place], como clicos abdominales o dolor de espalda. SOLICITE ATENCIN MDICA SI:  Tiene dolor plvico, dolor de espalda o clicos abdominales que los medicamentos no Engineer, petroleum.  Observa un aumento del sangrado entre y [employer].  Empapa los tampones o las compresas en el trmino de media hora o [place].  Se siente mareada, muy cansada o dbil. SOLICITE ATENCIN MDICA DE INMEDIATO SI:  Se desmaya.  El dolor plvico aumenta repentinamente. Esta informacin no tiene Marine scientist el consejo del mdico. Asegrese de hacerle al mdico cualquier pregunta que tenga. Document Released: 09/27/2005 Document Revised: 01/19/2016 Document Reviewed: 03/26/2014 Elsevier Interactive Patient Education  [employer].

## 2017-11-30 NOTE — Progress Notes (Signed)
Assessment & Plan:  Natalie Aguilar was seen today for follow-up.  Diagnoses and all orders for this visit:  Frequent headaches -     ibuprofen (ADVIL,MOTRIN) 600 MG tablet; Take 1 tablet (600 mg total) by mouth every 8 (eight) hours as needed. May alternate with acetaminophen as instructed Continue to keep a headache diary   Other insomnia -     traZODone (DESYREL) 50 MG tablet; Take 1 tablet (50 mg total) by mouth at bedtime as needed for sleep.  Vitamin D deficiency -     Vitamin D, Ergocalciferol, (DRISDOL) 50000 units CAPS capsule; Take 1 capsule (50,000 Units total) by mouth every 7 (seven) days.  Abnormal uterine bleeding (AUB) Please keep your scheduled appointment with GYN  Routine adult health maintenance -     Ambulatory referral to Dentistry -     Ambulatory referral to Ophthalmology  Flu-like symptoms -     Respiratory virus panel    Patient has been counseled on age-appropriate routine health concerns for screening and prevention. These are reviewed and up-to-date. Referrals have been placed accordingly. Immunizations are up-to-date or declined.    Subjective:   Chief Complaint  Patient presents with  . Follow-up    Patient is here for a follow-up. Patient stated her head hurts started yesterday and think she may have the Flu. Patient stated she had a fever and runny nose yesteray but did not check her temperature.    HPI Natalie Aguilar 37 y.o. female presents to office today for follow up to headaches and with concerns of possibly having the flu. VRI was used to communicate directly with patient for the entire encounter including providing detailed patient instructions.   Flu Like symptoms Onset of symptoms was 1 day ago with headache, ear pain, cough, runny nose, body aches and fever (she did not check her temperature). She reports her boyfriend has also been sick but was not tested for the flu. She has not taken any medication for her symptoms.   Frequent  Headaches We had previously discussed her keeping a headache diary. She has logged her headaches on her telephone which she has today.  Endorses headaches on January 30,31, Feb 1st. Feb 12th and 19th (only mild headache on those 2 days). She did not complete the headache diary as she did not log her dietary intake, sleep hours or stress levels on those days. She does endorse moderate relief of her headaches with Ibuprofen 600mg . She does notice the days she expreriences hadacheas are related to how well she slept the night before   Insomnia Trazodone providing significant relief of insomnia.   AUB She reports her mother and sister had some type of GYN surgery for myomas and cysts She is concerned that she may have myomas or cysts as well because her mother and sister had them. She uses the diva cup and endorses clots and "pieces of tissue" in her cup when she removes it. She also endorses during her menstrual cycle when she has to have a bowel movement she feels a sharp pain in her lower abdomen. Her bowel movements are regular and she denies constipation. I do not feel an [place] is warranted at this time as she is not having consistent abdominal pain nor is she experiencing continuous vaginal bleeding.  She has an appointment with GYN next month for possible IUD removal. I have referred her to GYN for further evaluation.    Review of Systems  Constitutional: Positive for chills, fever and malaise/fatigue.  HENT: Positive for congestion, sinus pain and sore throat. Negative for ear discharge, ear pain and hearing loss.   Eyes: Negative.   Respiratory: Positive for cough. Negative for sputum production, shortness of breath and wheezing.   Cardiovascular: Negative.  Negative for chest pain, orthopnea and leg swelling.  Gastrointestinal: Negative.  Negative for abdominal pain, diarrhea, nausea and vomiting.  Genitourinary:       SEE HPI  Neurological: Positive for headaches. Negative for dizziness and  focal weakness.  Endo/Heme/Allergies: Negative for environmental allergies.  Psychiatric/Behavioral: The patient is nervous/anxious and has insomnia.     History reviewed. No pertinent past medical history.  History reviewed. No pertinent surgical history.  Family History  Problem Relation Age of Onset  . Diabetes Mother     Social History Reviewed with no changes to be made today.   Outpatient Medications Prior to Visit  Medication Sig Dispense Refill  . acyclovir (ZOVIRAX) 400 MG tablet Take 1 tablet (400 mg total) by mouth 5 (five) times daily. (Patient not taking: Reported on 08/15/2017) 35 tablet 0  . ferrous sulfate 325 (65 FE) MG tablet Take 1 tablet (325 mg total) by mouth daily with breakfast. (Patient not taking: Reported on 11/30/2017) 90 tablet 3  . fluticasone (FLONASE) 50 MCG/ACT nasal spray Place 2 sprays into both nostrils daily. (Patient not taking: Reported on 11/09/2017) 16 g 6  . ibuprofen (ADVIL,MOTRIN) 600 MG tablet Take 1 tablet (600 mg total) by mouth every 8 (eight) hours as needed. (Patient not taking: Reported on 11/30/2017) 30 tablet 0  . traZODone (DESYREL) 50 MG tablet Take 1 tablet (50 mg total) by mouth at bedtime as needed for sleep. (Patient not taking: Reported on 11/30/2017) 30 tablet 1  . Vitamin D, Ergocalciferol, (DRISDOL) 50000 units CAPS capsule Take 1 capsule (50,000 Units total) by mouth every 7 (seven) days. (Patient not taking: Reported on 11/30/2017) 16 capsule 0   No facility-administered medications prior to visit.     No Known Allergies     Objective:    BP 120/78 (BP Location: Left Arm, Patient Position: Sitting, Cuff Size: Normal)   Pulse 99   Temp 98.4 F (36.9 C) (Oral)   Ht 5\' 7"  (1.702 m)   Wt 156 lb 12.8 oz (71.1 kg)   LMP 11/14/2017   SpO2 99%   BMI 24.56 kg/m  Wt Readings from Last 3 Encounters:  11/30/17 156 lb 12.8 oz (71.1 kg)  11/09/17 158 lb 9.6 oz (71.9 kg)  10/05/17 159 lb 3.2 oz (72.2 kg)    Physical Exam   Constitutional: She is oriented to person, place, and time. She appears well-developed and well-nourished. She appears ill.  HENT:  Head: Normocephalic.  Right Ear: Hearing, tympanic membrane, external ear and ear canal normal.  Left Ear: Hearing, tympanic membrane, external ear and ear canal normal.  No middle ear effusion.  Nose: Mucosal edema and rhinorrhea present. Right sinus exhibits no maxillary sinus tenderness and no frontal sinus tenderness. Left sinus exhibits no maxillary sinus tenderness and no frontal sinus tenderness.  Mouth/Throat: Mucous membranes are normal. Abnormal dentition. Dental caries present. Posterior oropharyngeal erythema present. No oropharyngeal exudate, posterior oropharyngeal edema or tonsillar abscesses.  Eyes: EOM are normal.  Neck: Normal range of motion. No thyromegaly present.  Cardiovascular: Normal rate and regular rhythm. Exam reveals no gallop and no friction rub.  No murmur heard. Pulmonary/Chest: Effort normal and breath sounds normal. No respiratory distress. She has no wheezes. She has no rales. She  exhibits no tenderness.  Abdominal: Soft. Bowel sounds are normal.  Musculoskeletal: Normal range of motion.  Lymphadenopathy:    She has no cervical adenopathy.  Neurological: She is alert and oriented to person, place, and time.  Skin: Skin is warm and dry.  Psychiatric: She has a normal mood and affect. Her behavior is normal. Judgment and thought content normal.         Patient has been counseled extensively about nutrition and exercise as well as the importance of adherence with medications and regular follow-up. The patient was given clear instructions to go to ER or return to medical center if symptoms don't improve, worsen or new problems develop. The patient verbalized understanding.   Follow-up: Return if symptoms worsen or fail to improve.   Gildardo Pounds, FNP-BC [facility] [place],  [place]   12/01/2017, 9:35 AM

## 2017-12-01 ENCOUNTER — Encounter: Payer: Self-pay | Admitting: Nurse Practitioner

## 2017-12-04 LAB — SPECIMEN STATUS REPORT

## 2017-12-06 LAB — RESPIRATORY VIRUS PANEL
Adenovirus: NEGATIVE
INFLUENZA A: POSITIVE — AB
Influenza B: NEGATIVE
Metapneumovirus: NEGATIVE
PARAINFLUENZA 1 A: NEGATIVE
PARAINFLUENZA 3 A: NEGATIVE
Parainfluenza 2: NEGATIVE
RESPIRATORY SYNCYTIAL VIRUS A: NEGATIVE
RESPIRATORY SYNCYTIAL VIRUS B: NEGATIVE
RHINOVIRUS: NEGATIVE

## 2017-12-30 ENCOUNTER — Ambulatory Visit (INDEPENDENT_AMBULATORY_CARE_PROVIDER_SITE_OTHER): Payer: Self-pay | Admitting: Obstetrics & Gynecology

## 2017-12-30 ENCOUNTER — Encounter: Payer: Self-pay | Admitting: Obstetrics & Gynecology

## 2017-12-30 VITALS — BP 116/64 | HR 62 | Ht 64.57 in | Wt 158.1 lb

## 2017-12-30 DIAGNOSIS — Z1151 Encounter for screening for human papillomavirus (HPV): Secondary | ICD-10-CM

## 2017-12-30 DIAGNOSIS — N939 Abnormal uterine and vaginal bleeding, unspecified: Secondary | ICD-10-CM

## 2017-12-30 DIAGNOSIS — Z124 Encounter for screening for malignant neoplasm of cervix: Secondary | ICD-10-CM

## 2017-12-30 DIAGNOSIS — Z113 Encounter for screening for infections with a predominantly sexual mode of transmission: Secondary | ICD-10-CM

## 2017-12-30 MED ORDER — NAPROXEN 500 MG PO TABS: 500.0000 mg | ORAL_TABLET | Freq: Two times a day (BID) | ORAL | 4 refills | Status: DC

## 2017-12-30 MED ORDER — NAPROXEN 500 MG PO TABS: 500.0000 mg | ORAL_TABLET | Freq: Two times a day (BID) | ORAL | 2 refills | Status: DC

## 2017-12-30 MED ORDER — TRANEXAMIC ACID 650 MG PO TABS: 1300.0000 mg | ORAL_TABLET | Freq: Three times a day (TID) | ORAL | 4 refills | Status: DC

## 2017-12-30 MED ORDER — TRANEXAMIC ACID 650 MG PO TABS: 1300.0000 mg | ORAL_TABLET | Freq: Three times a day (TID) | ORAL | 2 refills | Status: DC

## 2017-12-30 NOTE — Patient Instructions (Addendum)
Metrorragia funcional (Dysfunctional Uterine Bleeding) La metrorragia funcional es una hemorragia anormal proveniente del tero. La metrorragia funcional incluye estos sntomas:  Menstruacin que se adelanta o se atrasa.  Menstruacin menos o ms abundante, o con cogulos sanguneos.  Hemorragias entre los perodos [employer]ogg.  Ausencia de una o ms menstruaciones.  Hemorragias luego de Retail banker.  Sangrado luego de la menopausia. INSTRUCCIONES PARA EL CUIDADO EN EL HOGAR Est atenta a cualquier cambio en los sntomas. Estas indicaciones pueden ayudarla con el trastorno: Comidas  Siga una dieta equilibrada. Incluya alimentos con [employer] de hierro, como hgado, carne, Occupational hygienist, verduras de hoja verde y [place].  Si tiene estreimiento: ? [employer] Companies. ? Consuma frutas y verduras con alto contenido de agua y [place], [place] espinaca, zanahorias, frambuesas, manzanas y mango. Medicamentos  [employer] de venta libre y los recetados solamente como se lo haya indicado el mdico.  No haga cambios en los medicamentos sin hablar con el mdico.  La aspirina o los medicamentos que la contienen pueden aumentar la hemorragia. No tome esos medicamentos: ? Durante la semana previa a Hydrographic surveyor. ? Durante la [employer]ott.  Si le recetaron comprimidos de hierro, Social research officer, government se lo haya indicado el mdico. Estos ayudan a Camera operator hierro que el organismo pierde debido a este trastorno. Actividad  Si debe cambiarse el apsito o el tampn ms de una vez cada 2horas: ? Acustese con los pies elevados. ? Colquese una compresa fra en la parte baja del abdomen. ? Haga todo el reposo que pueda hasta que la hemorragia se detenga o disminuya.  No trate de Psychologist, counselling que la hemorragia se detenga y los niveles de hierro en la sangre se normalicen. Otras indicaciones  [employer], anote lo siguiente: ? La fecha de comienzo de Copy. ? La fecha de su finalizacin. ? Los [employer] que tiene una hemorragia anormal. ? Los problemas que advierte.  Concurra a todas las visitas de control como se lo haya indicado el mdico. Esto es importante. SOLICITE ATENCIN MDICA SI:  Se siente dbil o que va a desvanecerse.  Tiene nuseas y vmitos.  No puede comer ni beber sin vomitar.  Tiene mareos o diarrea mientras toma los medicamentos.  Est tomando anticonceptivos u hormonas, y desea cambiar o suspender estos medicamentos. SOLICITE ATENCIN MDICA DE INMEDIATO SI:  Tiene escalofros o fiebre.  Debe cambiarse el apsito o el tampn ms de una vez por hora.  La hemorragia se vuelve ms abundante o el flujo menstrual contiene cogulos con ms frecuencia.  Siente dolor en el abdomen.  Pierde la conciencia.  Le aparece una erupcin cutnea. Esta informacin no tiene Marine scientist el consejo del mdico. Asegrese de hacerle al mdico cualquier pregunta que tenga. Document Released: 07/07/2005 Document Revised: 06/18/2015 Document Reviewed: 12/23/2014 Elsevier Interactive Patient Education  2018 [place] endometrio (Endometrial Ablation) En la ablacin del endometrio se extirpa el recubrimiento interno del tero (endometrio). Este es generalmente un tratamiento que se Scientist, water quality, en forma ambulatoria. La ablacin permite evitar una ciruga mayor, como la ciruga para extirpar el cuello del tero y Nurse, learning disability (histerectoma). Luego de la ablacin del endometrio, tendr poco o nada de flujo menstrual y no podr tener hijos. Sin embargo, si se encuentra en la etapa de la premenopausia, deber usar un mtodo confiable de control de la natalidad luego del procedimiento, ya que hay una pequea probabilidad de que [place]  un embarazo. Hay diferentes razones para llevar a cabo este procedimiento, ellas son:  Perodos abundantes.  Sangrado que causa anemia.  Sangrado  irregular.  Fibromas que sangran en la superficie interior del tero, si no miden ms de 3 cm. Tal vez no sea posible realizarle este procedimiento si:  Desea tener hijos en el futuro.  Tiene clicos intensos durante el perodo menstrual.  Tiene clulas precancerosas o cancerosas en el tero.  Tuvo un embarazo reciente.  Est cursando la menopausia.  Se someti a [place] mayor de tero, la cual le produjo el afinamiento de la pared [place]. Las cirugas pueden incluir lo siguiente: ? La extirpacin de uno o ms fibromas uterinos (miomectoma). ? Ardelia Mems cesrea con una incisin clsica (vertical) en el tero. Pregntele al mdico qu tipo de Pensions consultant. En ocasiones, la Arts development officer piel es diferente de la que se forma en el tero. Aunque se haya sometido a una ciruga uterina, algunos tipos de ablacin an pueden ser seguros para su caso. Hable con el mdico. INFORME A SU MDICO:  Cualquier alergia que tenga.  Todos los [employer] [place], incluyendo vitaminas, hierbas, gotas oftlmicas, cremas y medicamentos de venta libre.  Problemas previos que usted o los [employer] de su familia hayan tenido con el uso de anestsicos.  Enfermedades de [employer].  Cirugas previas.  Padecimientos mdicos.  RIESGOS Y COMPLICACIONES Generalmente, este es un procedimiento seguro. Sin embargo, Games developer procedimiento, pueden surgir complicaciones. Las complicaciones posibles son:  Perforacin del tero.  Hemorragias.  Infeccin en el tero, vejiga, o vagina.  Lesin en los rganos circundantes.  Ardelia Mems burbuja de aire en un pulmn (embolia de aire).  Embarazo luego del procedimiento.  Si el procedimiento no resuelve el problema, ser necesaria una histerectoma.  Disminucin de la capacidad para Community education officer en el recubrimiento interno del tero. ANTES DEL PROCEDIMIENTO  Deber controlarse la membrana que recubre el tero para asegurarse de que no hay  clulas cancerosas o precancerosas.  Le harn una ecografa para observar el tamao del tero y verificar si hay anormalidades.  Le darn medicamentos para Engineer, structural de la membrana que recubre el tero.  PROCEDIMIENTO Durante el procedimiento, el medico usar un instrumento llamado resectoscopio para ver el interior del tero. Hay diferentes formas de extirpar la membrana que cubre el tero.  Radiofrecuencia: en este mtodo se utiliza un aparato de radiofrecuencia, corriente elctrica alterna, para extirpar la membrana interna del tero.  Crioterapia: en este mtodo se [place] fro extremo para congelar la membrana del tero.  Lquido caliente: en este mtodo se utiliza solucin salina con el mismo objetivo.  Microondas: Se utilizan microondas de alta energa para calentar la membrana y extirparla.  Baln termal: consiste en insertar un catter con un baln en la punta dentro del tero. La punta del baln contiene un lquido caliente que elimina la membrana que tapiza el [place]. DESPUS DEL PROCEDIMIENTO Despus del procedimiento, no tenga relaciones sexuales ni inserte nada en la vagina hasta que su mdico la autorice. Despus del procedimiento podr experimentar:  Clicos.  Flujo vaginal.  Ganas de orinar con frecuencia. Esta informacin no tiene Marine scientist el consejo del mdico. Asegrese de hacerle al mdico cualquier pregunta que tenga. Document Released: 05/30/2013 Document Revised: 06/18/2015 Document Reviewed: 02/28/2013 Elsevier Interactive Patient Education  2017 [place] (Hysterectomy Information) La histerectoma es una ciruga que se realiza para extirpar el tero. Esta ciruga se puede realizar para  tratar varios problemas mdicos. Despus de la ciruga, no volver a Personal assistant. Adems, debido a esta ciruga no podr quedar embarazada (estril). Asimismo, durante esta ciruga se podrn extirpar  las trompas de falopio y los ovarios (salpingooforectoma bilateral). RAZONES PARA REALIZAR UNA HISTERECTOMA  Sangrado persistente, anormal.  Dolor o infeccin persistente (crnico) en la pelvis.  El endometrio comienza a desarrollarse fuera del tero (endometriosis).  El endometrio comienza a desarrollarse en el msculo del tero (adenomiosis).  El tero desciende hacia la vagina (prolapso de los rganos de la pelvis).  Neoplasias benignas en el tero (fibromas uterinos) que provocan sntomas.  Clulas precancerosas.  Cncer cervical o cncer uterino.  TIPOS DE HISTERECTOMA  Histerectoma supracervical: en este tipo de intervencin, se extirpa la parte superior del tero, pero no el cuello del tero.  Histerectoma total: se extirpan el tero y el cuello del tero.  Histerectoma radical: se extirpan el tero, el cuello del tero y los tejidos fibrosos que sostienen al tero en su lugar en la pelvis (parametrio).  FORMAS EN QUE SE PUEDE REALIZAR UNA HISTERECTOMA  Histerectoma abdominal: se realiza un corte quirrgico grande (incisin) en el abdomen. Se extirpa el tero a travs de esta incisin.  Histerectoma vaginal: se realiza una incisin en la vagina. Se extirpa el tero a travs de esta incisin. No hay incisiones abdominales.  Histerectoma laparoscpica convencional: se realizan tres o cuatro incisiones pequeas en el abdomen. Se inserta un tubo delgado y luminoso con una cmara (laparoscopio) en una de las incisiones. A travs del resto de las incisiones se insertan otros instrumentos quirrgicos. El tero se corta en trozos pequeos. Las piezas pequeas se eliminan a travs de las incisiones, o se retiran a travs de la vagina.  Histerectoma vaginal asistida por laparoscopia (HVAL): se realizan tres o cuatro incisiones pequeas en el abdomen. Parte de la ciruga se realiza por va laparoscpica y parte por va vaginal. El tero se extirpa a travs de la  vagina.  Histerectoma laparoscpica asistida por robot: se insertan un laparoscopio y otros instrumentos quirrgicos en 3 o 4 incisiones pequeas en el abdomen. Se utiliza un dispositivo controlado por computadora para que el cirujano vea una imagen en 3D que lo ayuda a Chief Technology Officer los instrumentos quirrgicos. Esto permite movimientos ms precisos de los instrumentos quirrgicos. El tero se corta en trozos pequeos y se retira a travs de las incisiones o se elimina a travs de la vagina.  RIESGOS Y [place] complicaciones asociadas con este procedimiento incluyen las siguientes:  Hemorragia y riesgos de Designer, industrial/product una transfusin sangunea. Infrmele al mdico si no quiere recibir ningn tipo de hemoderivados.  Cogulos de [employer] piernas o los pulmones.  Infeccin.  Lesin en los rganos circundantes.  Problemas o efectos secundarios relacionados con la anestesia.  Transformacin de cualquiera de las otras tcnicas en una histerectoma abdominal. QU ESPERAR DESPUES DE UNA HISTERECTOMA  Le darn medicamentos para el dolor.  Necesitar que alguien Nature conservation officer con usted durante los primeros 3 a 5 das despus de que regrese a Medical illustrator.  Tendr que ver al cirujano para un seguimiento 2 a 4 semanas despus de la ciruga para evaluar su progreso.  Posiblemente tenga sntomas tempranos de menopausia, como sofocos, sudoracin nocturna e insomnio.  Si le han realizado una histerectoma debido a un problema que no era cncer ni una afeccin que poda causar cncer, ya no necesitar una prueba de Papanicolaou. Sin embargo, si ya no Designer, jewellery, es Furniture conservator/restorer  buena idea hacerse un examen regularmente para asegurarse de que no hay otros problemas.  Esta informacin no tiene Marine scientist el consejo del mdico. Asegrese de hacerle al mdico cualquier pregunta que tenga. Document Released: 09/27/2005 Document Revised: 07/18/2013 Document Reviewed:  06/04/2013 Elsevier Interactive Patient Education  2017 [employer].

## 2017-12-30 NOTE — Progress Notes (Signed)
GYNECOLOGY OFFICE VISIT NOTE  History:  37 y.o. F here today for management of AUB. Patient is Spanish-speaking only, Spanish interpreter present for this encounter.  This has been going on for years; has been on progestin IUD and Depo Provera which made it worse.  Could not remember to take OCPs daily but had horrible side effects.  Adamantly refuses hormonal management. Currently has Paragard for contraception, gets heavy prolonged periods about twice month which is improved from what she had on Mirena.  Wants to know what else to do. No presyncopal symptoms, had recent normal TSH and HgA1C (see labs); hemoglobin was 9.0 and she is on oral iron therapy She denies any current abnormal vaginal discharge, bleeding, pelvic pain or other concerns.   History reviewed. No pertinent past medical history.  History reviewed. No pertinent surgical history.  The following portions of the patient's history were reviewed and updated as appropriate: allergies, current medications, past family history, past medical history, past social history, past surgical history and problem list.   Health Maintenance:  Normal pap 3 years ago.  Review of Systems:  Pertinent items noted in HPI and remainder of comprehensive ROS otherwise negative.  Objective:  Physical Exam BP 116/64   Pulse 62   Ht 5' 4.57" (1.64 m)   Wt 158 lb 1.6 oz (71.7 kg)   LMP 12/14/2017 (Approximate)   BMI 26.66 kg/m  CONSTITUTIONAL: Well-developed, well-nourished female in no acute distress.  HENT:  Normocephalic, atraumatic. External right and left ear normal. Oropharynx is clear and moist EYES: Conjunctivae and EOM are normal. Pupils are equal, round, and reactive to light. No scleral icterus.  NECK: Normal range of motion, supple, no masses SKIN: Skin is warm and dry. No rash noted. Not diaphoretic. No erythema. No pallor. NEUROLOGIC: Alert and oriented to person, place, and time. Normal reflexes, muscle tone coordination. No  cranial nerve deficit noted. PSYCHIATRIC: Normal mood and affect. Normal behavior. Normal judgment and thought content. CARDIOVASCULAR: Normal heart rate noted RESPIRATORY: Effort and breath sounds normal, no problems with respiration noted ABDOMEN: Soft, no distention noted.   PELVIC: Normal appearing external genitalia; normal appearing vaginal mucosa and cervix with prominent ectropion.  [place] discharge noted. Paragard strings seen. Pap obtained, caused bleeding from ectropion. Unable to palpate uterus well, no other palpable masses, no uterine or adnexal tenderness. MUSCULOSKELETAL: Normal range of motion. No edema noted.  Labs and Imaging No results found.  Assessment & Plan:  1. Abnormal uterine bleeding (AUB) Likely anovulatory bleeding, does not want hormones.  Discussed Lysteda and Naproxen to help with bleeding, continue IUD for contraception for now.  Will get ultrasound to evaluate structural anomalies.  Pap and ancillary testing done, will follow up all results and manage accordingly.  Patient desires surgery, gave information about endometrial ablation vs hysterectomy; will discuss further at next visit once we get all results.  - US PELVIC COMPLETE WITH TRANSVAGINAL; Future - Cytology - PAP - naproxen (NAPROSYN) 500 MG tablet; Take 1 tablet (500 mg total) by mouth 2 (two) times daily with a meal. As needed for pain  Dispense: 30 tablet; Refill: 4 - tranexamic acid (LYSTEDA) 650 MG TABS tablet; Take 2 tablets (1,300 mg total) by mouth 3 (three) times daily. Take during menses for a maximum of five days  Dispense: 30 tablet; Refill: 4   Routine preventative health maintenance measures emphasized. Please refer to After Visit Summary for other counseling recommendations.   Return in about 3 weeks (around 01/20/2018) for Followup  and discuss long term AUB management.   Total face-to-face time with patient: 20 minutes.  Over 50% of encounter was spent on counseling and coordination  of care.   Verita Schneiders, MD, [place] for [employer], [place]

## 2017-12-30 NOTE — Progress Notes (Signed)
[name]

## 2017-12-30 NOTE — Progress Notes (Signed)
Scheduled ultrasound for 3/25 @ 9am

## 2018-01-02 ENCOUNTER — Ambulatory Visit (HOSPITAL_COMMUNITY)
Admission: RE | Admit: 2018-01-02 | Discharge: 2018-01-02 | Disposition: A | Discharge status: Home / Self Care | Payer: Self-pay | Source: Ambulatory Visit | Attending: Obstetrics & Gynecology | Admitting: Obstetrics & Gynecology

## 2018-01-02 DIAGNOSIS — N939 Abnormal uterine and vaginal bleeding, unspecified: Secondary | ICD-10-CM | POA: Insufficient documentation

## 2018-01-03 LAB — CYTOLOGY - PAP
BACTERIAL VAGINITIS: POSITIVE — AB
CHLAMYDIA, DNA PROBE: NEGATIVE
Candida vaginitis: NEGATIVE
DIAGNOSIS: NEGATIVE
HPV (WINDOPATH): NOT DETECTED
Neisseria Gonorrhea: NEGATIVE
Trichomonas: NEGATIVE

## 2018-01-30 ENCOUNTER — Ambulatory Visit: Payer: Self-pay | Admitting: Obstetrics & Gynecology

## 2018-02-09 ENCOUNTER — Ambulatory Visit (INDEPENDENT_AMBULATORY_CARE_PROVIDER_SITE_OTHER): Payer: Self-pay | Admitting: Obstetrics & Gynecology

## 2018-02-09 ENCOUNTER — Encounter: Payer: Self-pay | Admitting: Obstetrics & Gynecology

## 2018-02-09 VITALS — BP 116/65 | HR 58 | Wt 161.1 lb

## 2018-02-09 DIAGNOSIS — Z30432 Encounter for removal of intrauterine contraceptive device: Secondary | ICD-10-CM

## 2018-02-09 DIAGNOSIS — N939 Abnormal uterine and vaginal bleeding, unspecified: Secondary | ICD-10-CM

## 2018-02-09 DIAGNOSIS — L659 Nonscarring hair loss, unspecified: Secondary | ICD-10-CM

## 2018-02-09 DIAGNOSIS — B9689 Other specified bacterial agents as the cause of diseases classified elsewhere: Secondary | ICD-10-CM

## 2018-02-09 DIAGNOSIS — N760 Acute vaginitis: Secondary | ICD-10-CM

## 2018-02-09 MED ORDER — METRONIDAZOLE 500 MG PO TABS: 500.0000 mg | ORAL_TABLET | Freq: Two times a day (BID) | ORAL | 0 refills | Status: DC

## 2018-02-09 NOTE — Progress Notes (Signed)
GYNECOLOGY OFFICE VISIT NOTE  History:  37 y.o. F. here today for follow up of AUB management. Patient is Spanish-speaking only, Spanish interpreter present for this encounter.  Was last seen on 12/30/17; she declined any hormonal or surgical intervention and did not want to remove her Paragard. She was started on Naproxen and Lysteda which she said helped significantly reduce amount of bleeding, but not duration.  She now wants Paragard removed; declines any other contraception for now. Still does not want any hormones or surgical intervention.  Of note, patient reports increased hair loss lately, wants laboratory evaluation for this.  She denies any abnormal vaginal discharge,  pelvic pain or other GYN concerns.   No past medical history on file.  No past surgical history on file.  The following portions of the patient's history were reviewed and updated as appropriate: allergies, current medications, past family history, past medical history, past social history, past surgical history and problem list.   Health Maintenance:  Normal pap and negative HRHPV on 12/30/2017.  Review of Systems:  Pertinent items noted in HPI and remainder of comprehensive ROS otherwise negative.  Objective:  Physical Exam BP 116/65 (BP Location: Right Arm, Patient Position: Sitting, Cuff Size: Normal)   Pulse (!) 58   Wt 161 lb 1.6 oz (73.1 kg)   LMP 01/20/2018 (Exact Date)   BMI 27.17 kg/m  CONSTITUTIONAL: Well-developed, well-nourished female in no acute distress.  HENT:  Normocephalic, atraumatic. External right and left ear normal. Oropharynx is clear and moist EYES: Conjunctivae and EOM are normal. Pupils are equal, round, and reactive to light. No scleral icterus.  NECK: Normal range of motion, supple, no masses SKIN: Skin is warm and dry. No rash noted. Not diaphoretic. No erythema. No pallor. NEUROLOGIC: Alert and oriented to person, place, and time. Normal reflexes, muscle tone coordination. No  cranial nerve deficit noted. PSYCHIATRIC: Normal mood and affect. Normal behavior. Normal judgment and thought content. CARDIOVASCULAR: Normal heart rate noted RESPIRATORY: Effort and breath sounds normal, no problems with respiration noted ABDOMEN: Soft, no distention noted.   PELVIC: Normal appearing external genitalia; normal appearing vaginal mucosa and cervix.  IUD strings seen.  No abnormal discharge noted.  Normal uterine size, no other palpable masses, no uterine or adnexal tenderness. MUSCULOSKELETAL: Normal range of motion. No edema noted.  IUD Removal  Patient identified, informed consent performed, consent signed.During her pelvic exam, the cervix was visualized, no lesions, no abnormal discharge.  The strings of the IUD were grasped and pulled using ring forceps. The IUD was removed in its entirety.  Patient tolerated the procedure well.     Assessment & Plan:  1. Hair loss Unclear etiology, will rule out hyperandrogenism - Testosterone,Free and Total; Future - DHEA-sulfate; Future - TSH; Future  2. BV (bacterial vaginosis) Diagnosed on pap on 12/30/17; did not receive Rx.  Metronidazole prescribed. - metroNIDAZOLE (FLAGYL) 500 MG tablet; Take 1 tablet (500 mg total) by mouth 2 (two) times daily.  Dispense: 14 tablet; Refill: 0  3. Abnormal uterine bleeding (AUB) 4. Encounter for IUD removal S/p Paragard removal. Continue Lysteda and Naproxen fro now. Bleeding precautions reviewed.   Routine preventative health maintenance measures emphasized. Please refer to After Visit Summary for other counseling recommendations.   Return if symptoms worsen or fail to improve, for Lab appointment.  Total face-to-face time with patient: 15 minutes.  Over 50% of encounter was spent on counseling and coordination of care.   Verita Schneiders, MD, [place]  Gynecologist, Product/process development scientist for [employer], [place]

## 2018-02-09 NOTE — Progress Notes (Signed)
Patient wants her Paragard IUD removed d/t longer heavier periods and irregular bleeding.

## 2018-02-10 ENCOUNTER — Encounter: Payer: Self-pay | Admitting: Obstetrics & Gynecology

## 2018-02-10 ENCOUNTER — Other Ambulatory Visit: Payer: Self-pay

## 2018-02-10 DIAGNOSIS — L659 Nonscarring hair loss, unspecified: Secondary | ICD-10-CM

## 2018-02-10 NOTE — Patient Instructions (Signed)
Regrese a la clinica cuando tenga su cita. Si tiene problemas o preguntas, llama a la clinica o vaya a la [facility] al [facility].    

## 2018-02-12 LAB — TESTOSTERONE,FREE AND TOTAL
Testosterone, Free: 1.6 pg/mL (ref 0.0–4.2)
Testosterone: 20 ng/dL (ref 8–48)

## 2018-02-12 LAB — TSH: TSH: 1.34 u[IU]/mL (ref 0.450–4.500)

## 2018-02-12 LAB — DHEA-SULFATE: DHEA-SO4: 147.6 ug/dL (ref 57.3–279.2)

## 2018-02-14 ENCOUNTER — Encounter: Payer: Self-pay | Admitting: *Deleted

## 2018-02-20 ENCOUNTER — Ambulatory Visit: Payer: Self-pay

## 2018-02-23 ENCOUNTER — Ambulatory Visit: Payer: Self-pay | Attending: Nurse Practitioner | Admitting: Physician Assistant

## 2018-02-23 VITALS — BP 107/70 | HR 68 | Temp 98.7°F | Resp 16 | Ht 67.0 in | Wt 164.2 lb

## 2018-02-23 DIAGNOSIS — L853 Xerosis cutis: Secondary | ICD-10-CM

## 2018-02-23 DIAGNOSIS — L659 Nonscarring hair loss, unspecified: Secondary | ICD-10-CM | POA: Insufficient documentation

## 2018-02-23 DIAGNOSIS — Z791 Long term (current) use of non-steroidal anti-inflammatories (NSAID): Secondary | ICD-10-CM | POA: Insufficient documentation

## 2018-02-23 DIAGNOSIS — E559 Vitamin D deficiency, unspecified: Secondary | ICD-10-CM | POA: Insufficient documentation

## 2018-02-23 DIAGNOSIS — Z79899 Other long term (current) drug therapy: Secondary | ICD-10-CM | POA: Insufficient documentation

## 2018-02-23 DIAGNOSIS — Z789 Other specified health status: Secondary | ICD-10-CM

## 2018-02-23 MED ORDER — TRIAMCINOLONE ACETONIDE 0.1 % EX CREA: 1.0000 "application " | TOPICAL_CREAM | Freq: Two times a day (BID) | CUTANEOUS | 0 refills | Status: DC

## 2018-02-23 NOTE — Patient Instructions (Signed)
Take a multivitamin daily and eat a lot of vegetables and healthy diet.

## 2018-02-23 NOTE — Progress Notes (Signed)
Patient ID: Natalie Aguilar, female   DOB: 24-Oct-1980, 37 y.o.   MRN: [id number]      Natalie Aguilar, is a 37 y.o. female  RXV:[id number]  [id number]  DOB - [birth year]  Subjective:  Chief Complaint and HPI: Natalie Aguilar is a 37 y.o. female here today for Hair loss X 1 month.  Falling out a lot over the last month. Was seen by OB/Gyn 02/09/2018 for work-up of this problem.  No new shampoos or soaps or hair coloring/treatments.  TSH and labs/hormone levels done by Ob/gyn were reviewed.    Also having small areas of dry skin on thighs for the last few days.  No new soaps,detergents.  Itch a little.  No f/c.  No tick bites.    Vitamin D was low in December and has not been rechecked.    Wells Guiles with stratus interpreters  ROS:   Constitutional:  No f/c, No night sweats, No unexplained weight loss. EENT:  No vision changes, No blurry vision, No hearing changes. No mouth, throat, or ear problems.  Respiratory: No cough, No SOB Cardiac: No CP, no palpitations GI:  No abd pain, No N/V/D. GU: No Urinary s/sx Musculoskeletal: No joint pain Neuro: No headache, no dizziness, no motor weakness.  Skin: dry spots Endocrine:  No polydipsia. No polyuria. +hair loss Psych: Denies SI/HI  No problems updated.  ALLERGIES: No Known Allergies  PAST MEDICAL HISTORY: History reviewed. No pertinent past medical history.  MEDICATIONS AT HOME: Prior to Admission medications   Medication Sig Start Date End Date Taking? Authorizing Provider  metroNIDAZOLE (FLAGYL) 500 MG tablet Take 1 tablet (500 mg total) by mouth 2 (two) times daily. 02/09/18  Yes Anyanwu, Sallyanne Havers, MD  ferrous sulfate 325 (65 FE) MG tablet Take 1 tablet (325 mg total) by mouth daily with breakfast. Patient not taking: Reported on 02/09/2018 11/09/17   Gildardo Pounds, NP  naproxen (NAPROSYN) 500 MG tablet Take 1 tablet (500 mg total) by mouth 2 (two) times daily with a meal. As needed for pain Patient not taking: Reported on 02/09/2018 12/30/17    Anyanwu, Sallyanne Havers, MD  tranexamic acid (LYSTEDA) 650 MG TABS tablet Take 2 tablets (1,300 mg total) by mouth 3 (three) times daily. Take during menses for a maximum of five days Patient not taking: Reported on 02/09/2018 12/30/17   Osborne Oman, MD  triamcinolone cream (KENALOG) 0.1 % Apply 1 application topically 2 (two) times daily. 02/23/18   Argentina Donovan, PA-C  Vitamin D, Ergocalciferol, (DRISDOL) 50000 units CAPS capsule Take 1 capsule (50,000 Units total) by mouth every 7 (seven) days. Patient not taking: Reported on 02/09/2018 11/30/17   Gildardo Pounds, NP     Objective:  EXAM:   Vitals:   02/23/18 0932  BP: 107/70  Pulse: 68  Resp: 16  Temp: 98.7 F (37.1 C)  TempSrc: Oral  SpO2: 94%  Weight: 164 lb 3.2 oz (74.5 kg)  Height: 5\' 7"  (1.702 m)    General appearance : A&OX3. NAD. Non-toxic-appearing HEENT: Atraumatic and Normocephalic.  PERRLA. EOM intact.  Scalp appears normal.  No broken hairs.  No rash or redness.  No patches where she has actual areas of hair loss.   Neck: supple, no JVD. No cervical lymphadenopathy. No thyromegaly Chest/Lungs:  Breathing-non-labored, Good air entry bilaterally, breath sounds normal without rales, rhonchi, or wheezing  CVS: S1 S2 regular, no murmurs, gallops, rubs  Extremities: Bilateral Lower Ext shows no edema, both legs are warm to touch  with = pulse throughout Neurology:  CN II-XII grossly intact, Non focal.   Psych:  TP linear. J/I WNL. Normal speech. Appropriate eye contact and affect.  Skin:  No Rash-there are small patches of dry skin on her thighs.    Data Review Lab Results  Component Value Date   HGBA1C 5.4 08/15/2017     Assessment & Plan   1. Vitamin D deficiency Will recheck levels today - Vitamin D, 25-hydroxy  2. Hair loss No abnormality of scalp and hormone levels normal.  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  Reviewed labs and notes by Ob/gyn.  IUD removed at gyn appt  3. Dry skin - triamcinolone cream  (KENALOG) 0.1 %; Apply 1 application topically 2 (two) times daily.  Dispense: 30 g; Refill: 0  4. Language barrier stratus interpreters used and additional time performing visit was required.   Patient have been counseled extensively about nutrition and exercise  Return in about 6 weeks (around 04/06/2018) for Zelda Flmeing for hair loss.  The patient was given clear instructions to go to ER or return to medical center if symptoms don't improve, worsen or new problems develop. The patient verbalized understanding. The patient was told to call to get lab results if they haven't heard anything in the next week.     Freeman Caldron, PA-C [facility] [place], Brenda   02/23/2018, 9:47 AM

## 2018-02-23 NOTE — Progress Notes (Signed)
Pt. Stated she notice her hair is falling off and been going on for a month. Patient stated she have a boil on her thigh and it is itching.

## 2018-02-24 LAB — VITAMIN D 25 HYDROXY (VIT D DEFICIENCY, FRACTURES): VIT D 25 HYDROXY: 25.6 ng/mL — AB (ref 30.0–100.0)

## 2018-02-27 ENCOUNTER — Other Ambulatory Visit: Payer: Self-pay | Admitting: Physician Assistant

## 2018-02-27 DIAGNOSIS — E559 Vitamin D deficiency, unspecified: Secondary | ICD-10-CM

## 2018-02-27 MED ORDER — VITAMIN D (ERGOCALCIFEROL) 1.25 MG (50000 UNIT) PO CAPS: 50000.0000 [IU] | ORAL_CAPSULE | ORAL | 0 refills | Status: DC

## 2018-02-28 ENCOUNTER — Telehealth: Payer: Self-pay

## 2018-02-28 NOTE — Telephone Encounter (Signed)
CMA attempt to call patient to inform on lab results and Rx.  No answer and left a VM for patient to call back. Patient will need spanish interpreter.    If patient call back, please inform:  Your vitamin D is low.  This can contribute to muscle aches, anxiety, fatigue, and depression.  I have sent a prescription to the pharmacy for you to take once a week for 3 months.  We will recheck this level in 3-4 months.  Thanks, Freeman Caldron, PA-C

## 2018-02-28 NOTE — Telephone Encounter (Signed)
-----   Message from Argentina Donovan, [place] sent at 02/27/2018  7:16 AM EDT ----- Your vitamin D is low.  This can contribute to muscle aches, anxiety, fatigue, and depression.  I have sent a prescription to the pharmacy for you to take once a week for 3 months.  We will recheck this level in 3-4 months.  Thanks, Freeman Caldron, PA-C

## 2018-04-05 ENCOUNTER — Ambulatory Visit: Payer: Self-pay | Attending: Nurse Practitioner | Admitting: Nurse Practitioner

## 2018-05-31 ENCOUNTER — Ambulatory Visit: Payer: Self-pay | Attending: Nurse Practitioner | Admitting: Physician Assistant

## 2018-05-31 VITALS — BP 96/60 | HR 65 | Temp 98.1°F | Ht 67.0 in | Wt 158.4 lb

## 2018-05-31 DIAGNOSIS — E559 Vitamin D deficiency, unspecified: Secondary | ICD-10-CM | POA: Insufficient documentation

## 2018-05-31 DIAGNOSIS — J358 Other chronic diseases of tonsils and adenoids: Secondary | ICD-10-CM | POA: Insufficient documentation

## 2018-05-31 DIAGNOSIS — M7989 Other specified soft tissue disorders: Secondary | ICD-10-CM | POA: Insufficient documentation

## 2018-05-31 DIAGNOSIS — Z789 Other specified health status: Secondary | ICD-10-CM

## 2018-05-31 NOTE — Progress Notes (Signed)
Patient ID: Natalie Aguilar, female   DOB: 19-Jul-1981, 37 y.o.   MRN: [id number]     Natalie Aguilar, is a 37 y.o. female  Natalie Aguilar:[id number]  Natalie Aguilar:[id number]  DOB - [birth year]  Subjective:  Chief Complaint and HPI: Natalie Aguilar is a 37 y.o. female here today Feet and hands swelling for 1 month. Usually occurs in the afternoon or in the mornings but usually when she is outside or hot.  No SOB/orthopnea.  Also c/o "white/yellow things that smell bad" coming from her tonsils occasionally.  No ST.  No f/c.    Also needs to recheck vitamin D level.  Completed prescription vitamin D.    Natalie Aguilar with [employer].  ROS:   Constitutional:  No f/c, No night sweats, No unexplained weight loss. EENT:  No vision changes, No blurry vision, No hearing changes. No mouth, throat, or ear problems.  Respiratory: No cough, No SOB Cardiac: No CP, no palpitations GI:  No abd pain, No N/V/D. GU: No Urinary s/sx Musculoskeletal: No joint pain Neuro: No headache, no dizziness, no motor weakness.  Skin: No rash Endocrine:  No polydipsia. No polyuria.  Psych: Denies SI/HI  No problems updated.  ALLERGIES: No Known Allergies  PAST MEDICAL HISTORY: History reviewed. No pertinent past medical history.  MEDICATIONS AT HOME: Prior to Admission medications   Medication Sig Start Date End Date Taking? Authorizing Provider  ferrous sulfate 325 (65 FE) MG tablet Take 1 tablet (325 mg total) by mouth daily with breakfast. Patient not taking: Reported on 02/09/2018 11/09/17   Gildardo Pounds, NP  metroNIDAZOLE (FLAGYL) 500 MG tablet Take 1 tablet (500 mg total) by mouth 2 (two) times daily. Patient not taking: Reported on 05/31/2018 02/09/18   Anyanwu, Sallyanne Havers, MD  naproxen (NAPROSYN) 500 MG tablet Take 1 tablet (500 mg total) by mouth 2 (two) times daily with a meal. As needed for pain Patient not taking: Reported on 02/09/2018 12/30/17   Anyanwu, Sallyanne Havers, MD  tranexamic acid (LYSTEDA) 650 MG TABS tablet Take 2 tablets  (1,300 mg total) by mouth 3 (three) times daily. Take during menses for a maximum of five days Patient not taking: Reported on 02/09/2018 12/30/17   Osborne Oman, MD  triamcinolone cream (KENALOG) 0.1 % Apply 1 application topically 2 (two) times daily. Patient not taking: Reported on 05/31/2018 02/23/18   Argentina Donovan, PA-C  Vitamin D, Ergocalciferol, (DRISDOL) 50000 units CAPS capsule Take 1 capsule (50,000 Units total) by mouth every 7 (seven) days. Patient not taking: Reported on 05/31/2018 02/27/18   Argentina Donovan, PA-C     Objective:  EXAM:   Vitals:   05/31/18 1015  BP: 96/60  Pulse: 65  Temp: 98.1 F (36.7 C)  TempSrc: Oral  SpO2: 100%  Weight: 158 lb 6.4 oz (71.8 kg)  Height: 5\' 7"  (1.702 m)    General appearance : A&OX3. NAD. Non-toxic-appearing HEENT: Atraumatic and Normocephalic.  PERRLA. EOM intact.  TM clear B. Mouth-MMM, post pharynx WNL w/o erythema, No PND. Throat appears normal today. Neck: supple, no JVD. No cervical lymphadenopathy. No thyromegaly Chest/Lungs:  Breathing-non-labored, Good air entry bilaterally, breath sounds normal without rales, rhonchi, or wheezing  CVS: S1 S2 regular, no murmurs, gallops, rubs  Extremities: Bilateral Lower Ext shows no edema, both legs are warm to touch with = pulse throughout Neurology:  CN II-XII grossly intact, Non focal.   Psych:  TP linear. J/I WNL. Normal speech. Appropriate eye contact and affect.  Skin:  No Rash  Data Review Lab Results  Component Value Date   HGBA1C 5.4 08/15/2017     Assessment & Plan   1. Leg swelling Increase water intake-80 ounces daily, decrease sugar and salt intake.   - TSH - Basic metabolic panel  2. Vitamin D deficiency - Vitamin D, 25-hydroxy  3. Tonsillith-likely Oral hygiene and tonsil care education reviewed. Salt water gargles prn.    4. Language barrier-stratus interpreters used and additional time performing visit was required.    Patient have been  counseled extensively about nutrition and exercise  Return if symptoms worsen or fail to improve.  The patient was given clear instructions to go to ER or return to medical center if symptoms don't improve, worsen or new problems develop. The patient verbalized understanding. The patient was told to call to get lab results if they haven't heard anything in the next week.     Freeman Caldron, PA-C [facility] [place], [place]   05/31/2018, 10:45 AM

## 2018-05-31 NOTE — Patient Instructions (Addendum)
Drink 80 ounces water daily, decrease sugar and salt intake.     Edema (Edema) Un edema es una acumulacin anormal de lquidos. Es ms frecuente en las piernas y los muslos. La hinchazn indolora de pies y tobillos es ms probable a medida que una persona envejece. Tambin es comn en la piel ms floja, como alrededor [employer]. CUIDADOS EN EL HOGAR  Mantenga la parte afectada del cuerpo por encima del nivel del corazn mientras est recostado.  No se quede quieto ni permanezca de pie durante [employer].  No coloque nada exactamente debajo de las rodillas al recostarse.  No use ropa ajustada en los muslos.  Ejercite las piernas para ayudar a que la inflamacin (hinchazn) disminuya.  Use vendajes elsticos o medias de compresin segn las indicaciones del mdico.  Una dieta con bajo contenido de sal puede ayudar a disminuir la hinchazn.  Solo tome los [employer] le haya indicado su mdico.  SOLICITE AYUDA SI:  El tratamiento no funciona.  Tiene una enfermedad cardaca, heptica o renal, y nota que la piel parece hinchada o tiene aspecto brillante.  Tiene hinchazn en las piernas que no mejora cuando las eleva.  Ha aumentado sbitamente de peso sin ningn motivo.  SOLICITE AYUDA DE INMEDIATO SI:  Tiene dificultad para respirar o le duele el pecho.  No puede respirar cuando se acuesta.  Las reas hinchadas presentan dolor, enrojecimiento o Freight [employer].  Tiene una enfermedad cardaca, heptica o renal, y le aparece un edema de repente.  Tiene fiebre y los sntomas empeoran de manera sbita.  ASEGRESE DE QUE:  Comprende estas instrucciones.  Controlar su afeccin.  Recibir ayuda de inmediato si no mejora o si empeora.  Esta informacin no tiene Marine scientist el consejo del mdico. Asegrese de hacerle al mdico cualquier pregunta que tenga. Document Released: 07/18/2013 Document Revised: 10/02/2013 Document Reviewed: 07/20/2013 Elsevier Interactive  Patient Education  2017 [employer].

## 2018-05-31 NOTE — Progress Notes (Signed)
Patient is having swelling in hands and feet.

## 2018-06-01 LAB — BASIC METABOLIC PANEL
BUN/Creatinine Ratio: 17 (ref 9–23)
BUN: 13 mg/dL (ref 6–20)
CALCIUM: 9.3 mg/dL (ref 8.7–10.2)
CHLORIDE: 103 mmol/L (ref 96–106)
CO2: 23 mmol/L (ref 20–29)
CREATININE: 0.77 mg/dL (ref 0.57–1.00)
GFR calc Af Amer: 114 mL/min/{1.73_m2} (ref >59)
GFR calc non Af Amer: 99 mL/min/{1.73_m2} (ref >59)
Glucose: 82 mg/dL (ref 65–99)
Potassium: 4.2 mmol/L (ref 3.5–5.2)
Sodium: 140 mmol/L (ref 134–144)

## 2018-06-01 LAB — VITAMIN D 25 HYDROXY (VIT D DEFICIENCY, FRACTURES): VIT D 25 HYDROXY: 29.8 ng/mL — AB (ref 30.0–100.0)

## 2018-06-01 LAB — TSH: TSH: 1.02 u[IU]/mL (ref 0.450–4.500)

## 2018-06-09 ENCOUNTER — Telehealth: Payer: Self-pay

## 2018-06-09 NOTE — Telephone Encounter (Signed)
-----   Message from Argentina Donovan, [place] sent at 06/01/2018  8:11 AM EDT ----- Your thyroid, blood sugar, kidney function, and electrolytes are normal and show no health problem causing swelling.  Your vitamin D is almost normal.  I recommend you take 2000units vitamin D(over the counter) daily as a maintenance dose.  Follow the instructions on your handout for leg and hand swelling. Follow up if your have questions or concerns.  Thanks, Freeman Caldron, PA-C

## 2018-06-09 NOTE — Telephone Encounter (Signed)
Patient was called and there is no voicemail set up to leave a message.   If patient returns phone call please inform patient of lab results below.

## 2018-07-06 IMAGING — US US PELVIS COMPLETE TRANSABD/TRANSVAG
1 series · 15 of 25 positions shown · non-contrast
Comparison: None

CLINICAL DATA: Abnormal uterine bleeding.  Intrauterine device.



[Series 1: us pelvis complete transabd/transvag · 45 acquisitions, 15 frames shown]
[im 1/45]
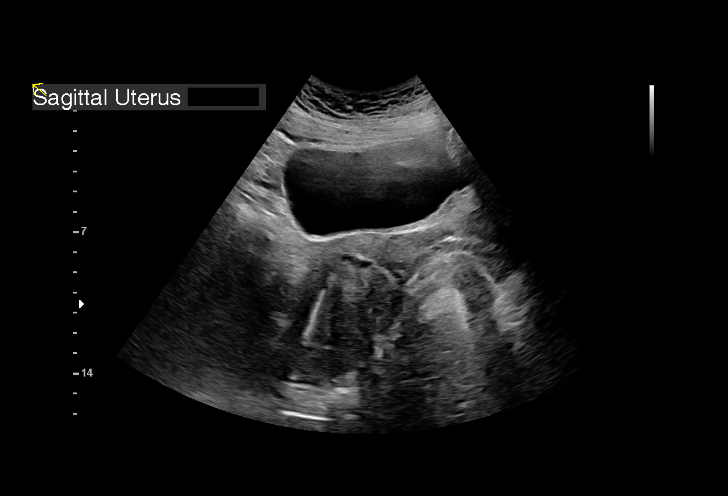
[im 4/45]
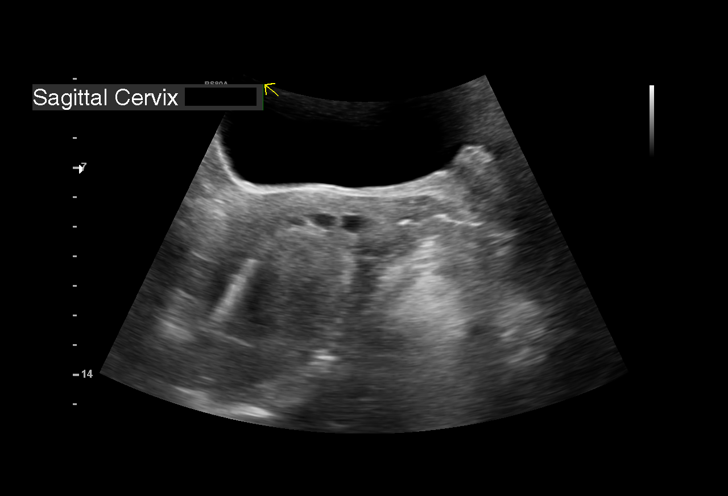
[im 8/45]
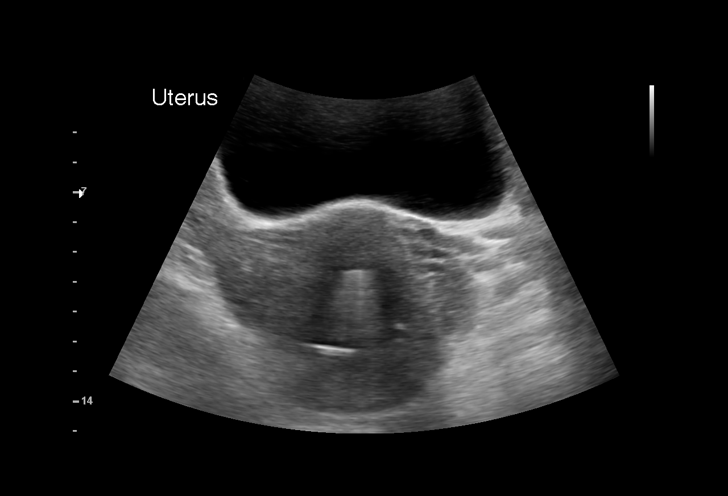
[im 10/45]
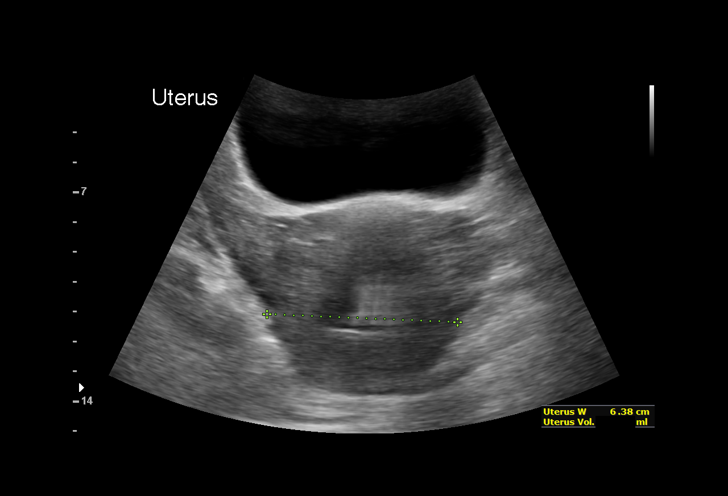
[im 13/45]
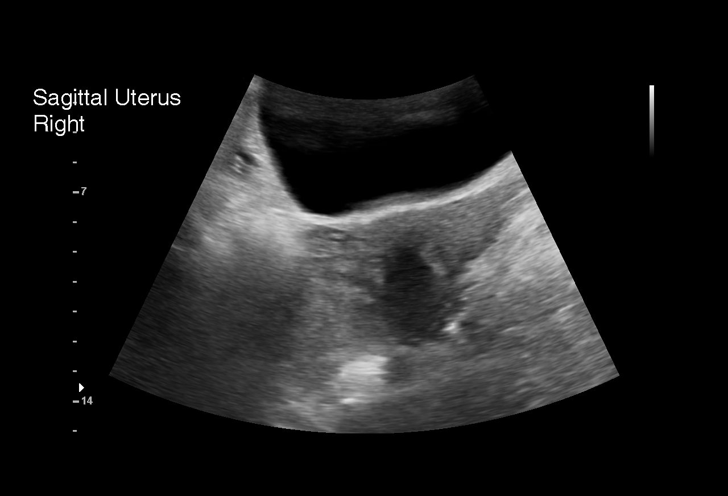
[im 17/45]
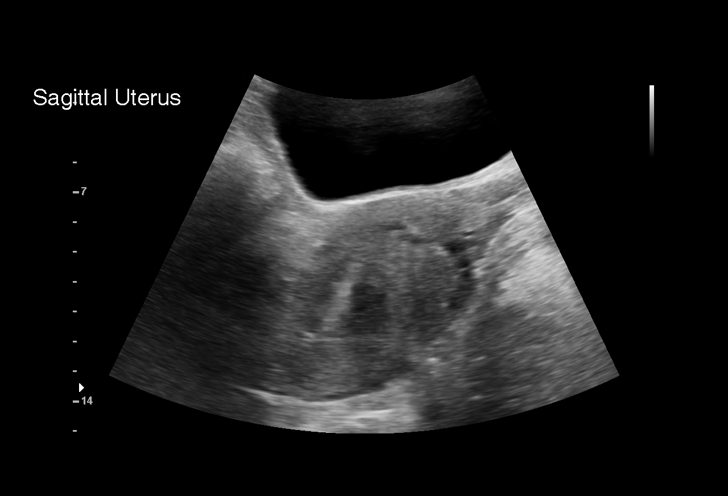
[im 19/45]
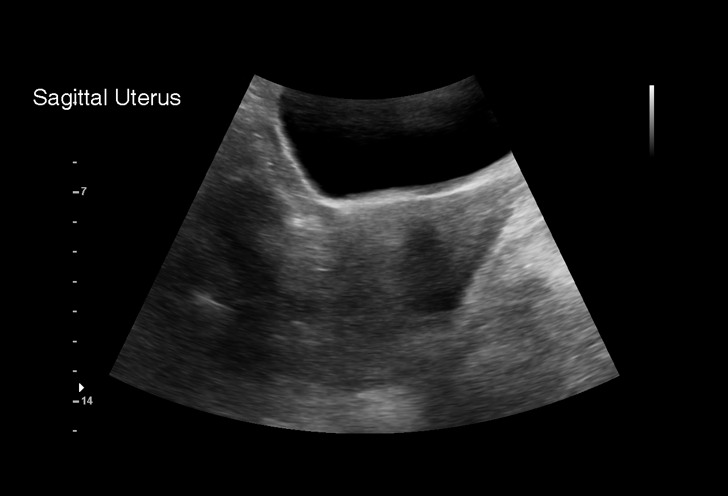
[im 23/45]
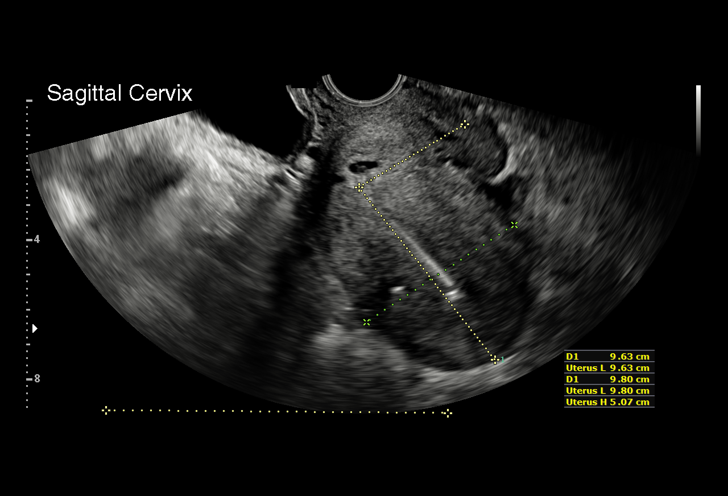
[im 26/45]
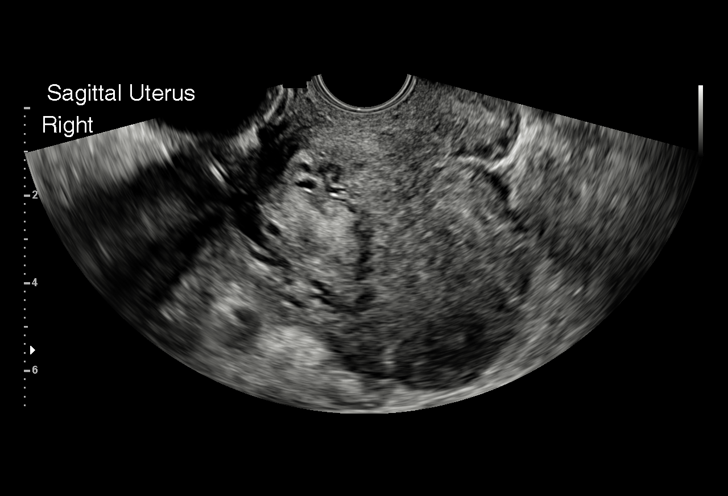
[im 28/45]
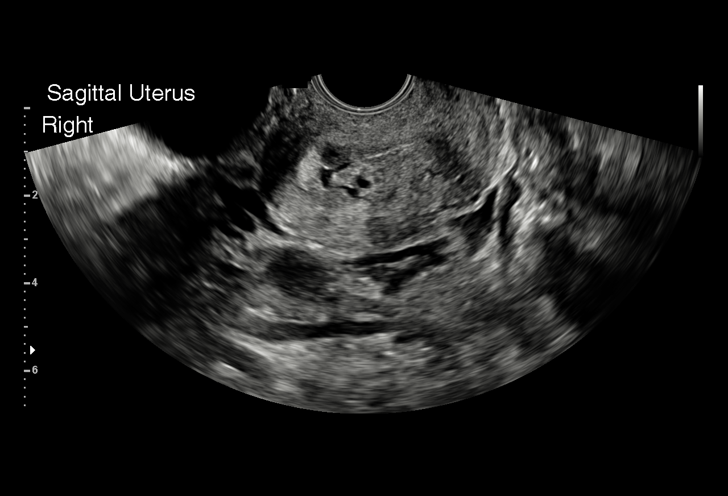
[im 32/45]
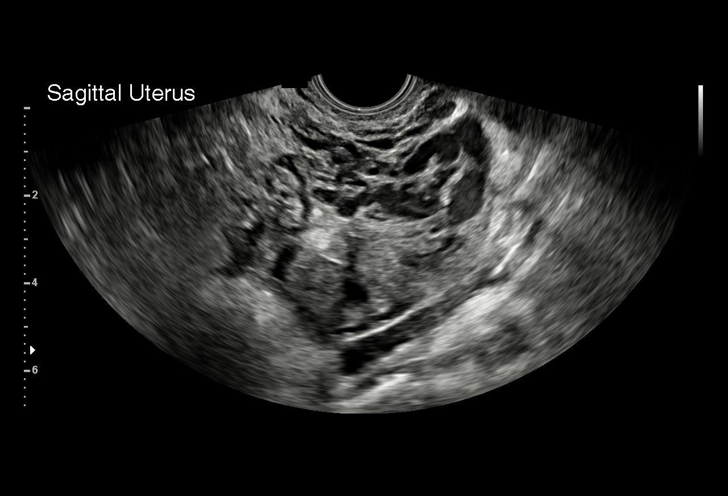
[im 35/45]
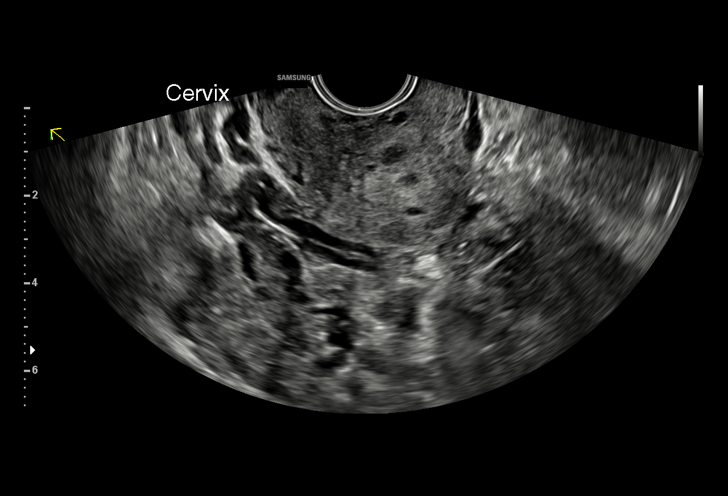
[im 37/45]
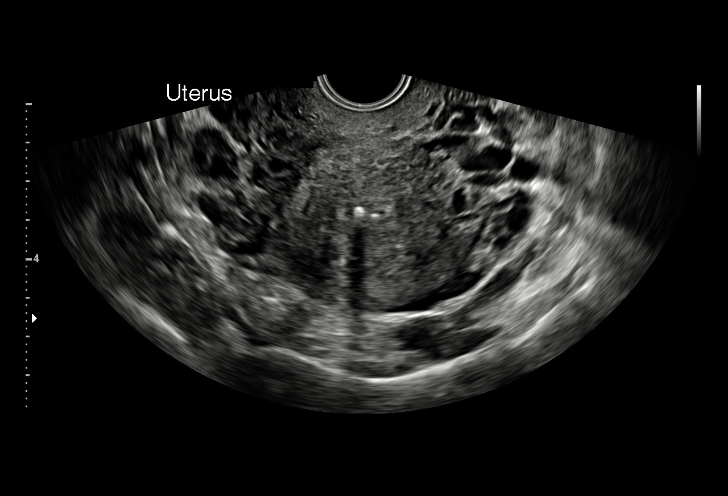
[im 41/45]
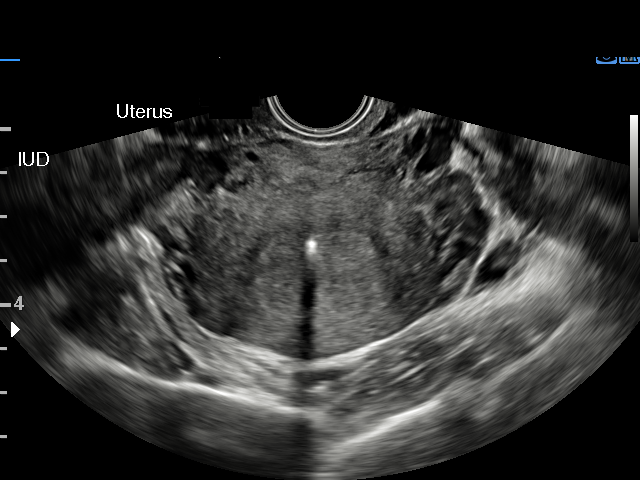
[im 45/45]
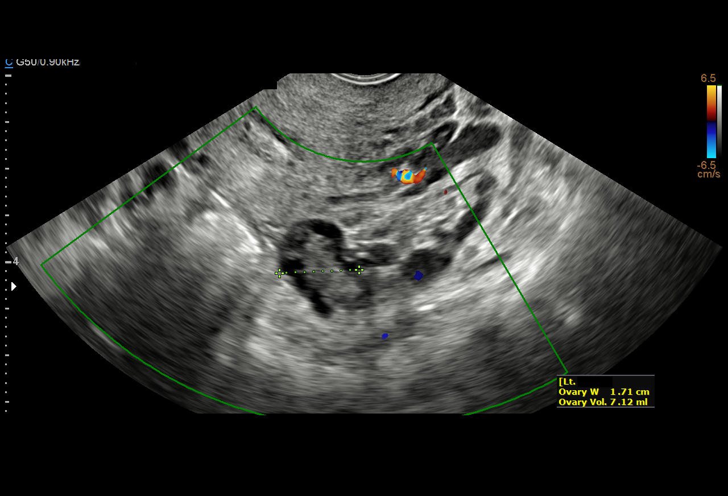

[15 of 25 positions shown; findings below may reference images not displayed]

FINDINGS: Uterus

Measurements: 10.1 x 5.1 x 6.9 cm. No fibroids or other mass
visualized.

Endometrium

Thickness: 6 mm. Intrauterine device is present and appears in
appropriate position.

Right ovary

Measurements: 4.3 x 2.4 x 2.8 cm. Normal appearance/no adnexal mass.

Left ovary

Measurements: 3.8 x 1.7 x 1.9 cm. Normal appearance/no adnexal mass.

Other findings

No abnormal free fluid.
IMPRESSION: Intrauterine device is present and appears appropriately located.

Endometrium measures 6 mm. If bleeding remains unresponsive to
hormonal or medical therapy, sonohysterogram should be considered
for focal lesion work-up. (Ref: Radiological Reasoning: Algorithmic
Workup of Abnormal Vaginal Bleeding with Endovaginal Sonography and
Sonohysterography. AJR [id number]; [id number])

## 2018-12-27 ENCOUNTER — Ambulatory Visit: Payer: Self-pay | Admitting: Critical Care Medicine

## 2019-10-26 ENCOUNTER — Encounter (HOSPITAL_COMMUNITY): Payer: Self-pay

## 2019-10-26 ENCOUNTER — Ambulatory Visit (HOSPITAL_COMMUNITY)
Admission: EM | Admit: 2019-10-26 | Discharge: 2019-10-26 | Disposition: A | Discharge status: Home / Self Care | Payer: HRSA Program | Attending: Emergency Medicine | Admitting: Emergency Medicine

## 2019-10-26 ENCOUNTER — Other Ambulatory Visit: Payer: Self-pay

## 2019-10-26 DIAGNOSIS — R140 Abdominal distension (gaseous): Secondary | ICD-10-CM | POA: Diagnosis not present

## 2019-10-26 DIAGNOSIS — R42 Dizziness and giddiness: Secondary | ICD-10-CM

## 2019-10-26 DIAGNOSIS — R519 Headache, unspecified: Secondary | ICD-10-CM | POA: Diagnosis not present

## 2019-10-26 DIAGNOSIS — Z20822 Contact with and (suspected) exposure to covid-19: Secondary | ICD-10-CM | POA: Insufficient documentation

## 2019-10-26 DIAGNOSIS — R110 Nausea: Secondary | ICD-10-CM | POA: Insufficient documentation

## 2019-10-26 MED ORDER — METOCLOPRAMIDE HCL 5 MG/ML IJ SOLN
5.0000 mg | Freq: Once | INTRAMUSCULAR | Status: AC
  Administered 2019-10-26: 14:00:00 5 mg via INTRAMUSCULAR

## 2019-10-26 MED ORDER — DEXAMETHASONE SODIUM PHOSPHATE 10 MG/ML IJ SOLN
INTRAMUSCULAR | Status: AC
  Filled 2019-10-26: qty 1

## 2019-10-26 MED ORDER — KETOROLAC TROMETHAMINE 60 MG/2ML IM SOLN
INTRAMUSCULAR | Status: AC
  Filled 2019-10-26: qty 2

## 2019-10-26 MED ORDER — METOCLOPRAMIDE HCL 5 MG/ML IJ SOLN
INTRAMUSCULAR | Status: AC
  Filled 2019-10-26: qty 2

## 2019-10-26 MED ORDER — DEXAMETHASONE SODIUM PHOSPHATE 10 MG/ML IJ SOLN
10.0000 mg | Freq: Once | INTRAMUSCULAR | Status: AC
  Administered 2019-10-26: 14:00:00 10 mg via INTRAMUSCULAR

## 2019-10-26 MED ORDER — KETOROLAC TROMETHAMINE 60 MG/2ML IM SOLN
60.0000 mg | Freq: Once | INTRAMUSCULAR | Status: AC
  Administered 2019-10-26: 60 mg via INTRAMUSCULAR

## 2019-10-26 NOTE — ED Provider Notes (Signed)
[place]    CSN: [id number] Arrival date & time: 10/26/19  1214      History   Chief Complaint Chief Complaint  Patient presents with  . Dizziness  . Nausea  . Headache    HPI Natalie Aguilar is a 39 y.o. female.   Natalie Aguilar presents with complaints of symptoms which woke her around 4am today, spinning sensation, "electric shock" type sensations to head, as well as nausea. Still with nausea. Symptoms are worse when she lays down. No falls, but does have the sensation of unsteadiness. Electric shock sensation is to her head, all over. Denies vision changes or vision loss. No vomiting. Has had a similar episode in the past when laying in bed, it was approximately 5 months ago. It went away without any specific treatment. LMP 1/9, it was not any more heavy than normal. Hasn't taken any medications for her symptoms today. Has been able to eat and drink today, no vomiting, but felt bloated after. No fevers, no neck pain. No known ill contacts, no other URI symptoms.     Spanish video interpreter used to collect history and physical exam.     ROS per HPI, negative if not otherwise mentioned.      History reviewed. No pertinent past medical history.  There are no problems to display for this patient.   History reviewed. No pertinent surgical history.  OB History   No obstetric history on file.      Home Medications    Prior to Admission medications   Medication Sig Start Date End Date Taking? Authorizing Provider  ferrous sulfate 325 (65 FE) MG tablet Take 1 tablet (325 mg total) by mouth daily with breakfast. Patient not taking: Reported on 02/09/2018 11/09/17   Gildardo Pounds, NP  metroNIDAZOLE (FLAGYL) 500 MG tablet Take 1 tablet (500 mg total) by mouth 2 (two) times daily. Patient not taking: Reported on 05/31/2018 02/09/18   Anyanwu, Sallyanne Havers, MD  naproxen (NAPROSYN) 500 MG tablet Take 1 tablet (500 mg total) by mouth 2 (two) times daily with a meal. As  needed for pain Patient not taking: Reported on 02/09/2018 12/30/17   Anyanwu, Sallyanne Havers, MD  tranexamic acid (LYSTEDA) 650 MG TABS tablet Take 2 tablets (1,300 mg total) by mouth 3 (three) times daily. Take during menses for a maximum of five days Patient not taking: Reported on 02/09/2018 12/30/17   Osborne Oman, MD  triamcinolone cream (KENALOG) 0.1 % Apply 1 application topically 2 (two) times daily. Patient not taking: Reported on 05/31/2018 02/23/18   Argentina Donovan, PA-C  Vitamin D, Ergocalciferol, (DRISDOL) 50000 units CAPS capsule Take 1 capsule (50,000 Units total) by mouth every 7 (seven) days. Patient not taking: Reported on 05/31/2018 02/27/18   Argentina Donovan, PA-C    Family History Family History  Problem Relation Age of Onset  . Diabetes Mother     Social History Social History   Tobacco Use  . Smoking status: Never Smoker  . Smokeless tobacco: Never Used  Substance Use Topics  . Alcohol use: Yes    Comment: occasionally   . Drug use: No     Allergies   Patient has no known allergies.   Review of Systems Review of Systems   Physical Exam Triage Vital Signs ED Triage Vitals  Enc Vitals Group     BP 10/26/19 1236 118/63     Pulse Rate 10/26/19 1236 67     Resp 10/26/19 1236 16  Temp 10/26/19 1236 98.3 F (36.8 C)     Temp Source 10/26/19 1236 Oral     SpO2 10/26/19 1236 100 %     Weight --      Height --      Head Circumference --      Peak Flow --      Pain Score 10/26/19 1310 4     Pain Loc --      Pain Edu? --      Excl. in [place]? --    No data found.  Updated Vital Signs BP 118/63   Pulse 67   Temp 98.3 F (36.8 C) (Oral)   Resp 16   LMP 10/19/2018 (Exact Date)   SpO2 100%    Physical Exam Constitutional:      General: She is not in acute distress.    Appearance: She is well-developed.  Eyes:     Extraocular Movements: Extraocular movements intact.     Pupils: Pupils are equal, round, and reactive to light.  Cardiovascular:      Rate and Rhythm: Normal rate.  Pulmonary:     Effort: Pulmonary effort is normal.  Musculoskeletal:     Cervical back: Normal range of motion.  Skin:    General: Skin is warm and dry.  Neurological:     Mental Status: She is alert and oriented to person, place, and time. Mental status is at baseline.     GCS: GCS eye subscore is 4. GCS verbal subscore is 5. GCS motor subscore is 6.     Cranial Nerves: No cranial nerve deficit or facial asymmetry.     Sensory: No sensory deficit.     Coordination: Romberg sign positive.     Comments: Intermittently holding temples at onset of pain; otherwise sitting comfortably without apparent discomfort; initially tolerates romberg but then it increases her dizziness sensation and has to abort      UC Treatments / Results  Labs (all labs ordered are listed, but only abnormal results are displayed) Labs Reviewed  NOVEL CORONAVIRUS, NAA (HOSP ORDER, SEND-OUT TO REF LAB; TAT 18-24 HRS)    EKG   Radiology No results found.  Procedures Procedures (including critical care time)  Medications Ordered in UC Medications  ketorolac (TORADOL) injection 60 mg (60 mg Intramuscular Given 10/26/19 1415)  metoCLOPramide (REGLAN) injection 5 mg (5 mg Intramuscular Given 10/26/19 1415)  dexamethasone (DECADRON) injection 10 mg (10 mg Intramuscular Given 10/26/19 1416)    Initial Impression / Assessment and Plan / UC Course  I have reviewed the triage vital signs and the nursing notes.  Pertinent labs & imaging results that were available during my care of the patient were reviewed by me and considered in my medical decision making (see chart for details).     Dizziness, nausea, headache. Unable to tolerate romberg testing due to dizziness, otherwise normal neurological exam. Per chart review with history of headaches, although patient states this is different. Opted to try a migraine cocktail here tonight with strict ER precautions if headache  persists, returns or worsens. Patient verbalized understanding and agreeable to plan.  Ambulatory out of clinic without difficulty.   Final Clinical Impressions(s) / UC Diagnoses   Final diagnoses:  Acute nonintractable headache, unspecified headache type  Dizziness     Discharge Instructions     We will try some medications here today to help with your pain.  This includes nausea medication, it may cause slight tiredness.  Go home and rest in a  quiet and dark room without screens (tv or phone.) If no improvement or any worsening in the next 2 hours please go to the ER.     Probaremos algunos medicamentos aqu hoy para ayudar con Psychiatric nurse. Esto incluye medicamentos para las nuseas que pueden causar un ligero cansancio. Vete a casa y descansa en una habitacin tranquila y Meredith Mody sin pantallas (televisin o telfono). Si no mejora o empeora en las prximas 2 horas, vaya a la sala de emergencias.    ED Prescriptions    None     PDMP not reviewed this encounter.   Zigmund Gottron, NP 10/27/19 [phone]

## 2019-10-26 NOTE — Discharge Instructions (Signed)
We will try some medications here today to help with your pain.  This includes nausea medication, it may cause slight tiredness.  Go home and rest in a quiet and dark room without screens (tv or phone.) If no improvement or any worsening in the next 2 hours please go to the ER.     Probaremos algunos medicamentos aqu hoy para ayudar con Psychiatric nurse. Esto incluye medicamentos para las nuseas que pueden causar un ligero cansancio. Vete a casa y descansa en una habitacin tranquila y Meredith Mody sin pantallas (televisin o telfono). Si no mejora o empeora en las prximas 2 horas, vaya a la sala de emergencias.

## 2019-10-26 NOTE — ED Triage Notes (Signed)
Pt states she was sleeping and she woke up feeling dizzy, the room was spinning and she started having nausea today around 4:00 am. Pt states she started having headache this morning. Pt described the headache felt like electric shocks.

## 2019-10-28 LAB — NOVEL CORONAVIRUS, NAA (HOSP ORDER, SEND-OUT TO REF LAB; TAT 18-24 HRS): SARS-CoV-2, NAA: NOT DETECTED

## 2020-05-30 ENCOUNTER — Ambulatory Visit: Payer: Self-pay | Attending: Family Medicine

## 2020-05-30 ENCOUNTER — Other Ambulatory Visit: Payer: Self-pay

## 2020-06-06 ENCOUNTER — Ambulatory Visit: Payer: Self-pay | Attending: Internal Medicine | Admitting: Family

## 2020-06-06 ENCOUNTER — Other Ambulatory Visit: Payer: Self-pay

## 2020-06-06 ENCOUNTER — Other Ambulatory Visit (HOSPITAL_COMMUNITY)
Admission: RE | Admit: 2020-06-06 | Discharge: 2020-06-06 | Disposition: A | Discharge status: Home / Self Care | Payer: Self-pay | Source: Ambulatory Visit | Attending: Internal Medicine | Admitting: Internal Medicine

## 2020-06-06 ENCOUNTER — Encounter: Payer: Self-pay | Admitting: Family

## 2020-06-06 VITALS — BP 117/72 | HR 57 | Temp 97.9°F | Resp 16 | Ht 65.0 in | Wt 155.6 lb

## 2020-06-06 DIAGNOSIS — A749 Chlamydial infection, unspecified: Secondary | ICD-10-CM

## 2020-06-06 DIAGNOSIS — N898 Other specified noninflammatory disorders of vagina: Secondary | ICD-10-CM

## 2020-06-06 DIAGNOSIS — Z789 Other specified health status: Secondary | ICD-10-CM

## 2020-06-06 DIAGNOSIS — R21 Rash and other nonspecific skin eruption: Secondary | ICD-10-CM

## 2020-06-06 DIAGNOSIS — K921 Melena: Secondary | ICD-10-CM

## 2020-06-06 LAB — POCT URINALYSIS DIP (CLINITEK)
Bilirubin, UA: NEGATIVE
Blood, UA: NEGATIVE
Glucose, UA: NEGATIVE mg/dL
Ketones, POC UA: NEGATIVE mg/dL
Leukocytes, UA: NEGATIVE
Nitrite, UA: NEGATIVE
POC PROTEIN,UA: NEGATIVE
Spec Grav, UA: >=1.03 — AB (ref 1.010–1.025)
Urobilinogen, UA: 0.2 E.U./dL
pH, UA: 6 (ref 5.0–8.0)

## 2020-06-06 MED ORDER — FLUCONAZOLE 150 MG PO TABS: 150.0000 mg | ORAL_TABLET | Freq: Once | ORAL | 0 refills | Status: AC

## 2020-06-06 NOTE — Progress Notes (Signed)
Patient ID: Natalie Aguilar, female    DOB: Feb 21, 1981  MRN: [id number]  CC: Vaginal Discharge  Subjective: Natalie Aguilar is a 39 y.o. female without medical history who presents for vaginal discharge.   1. VAGINAL DISCHARGE: Duration: months, reports discharge is less with periods Discharge description: white thick and then turns yellow  Pruritus: yes Dysuria: no Malodorous: no Urinary frequency: no Fevers: no Abdominal pain: no  Sexual activity: yes Burning with urination: yes Contraception: no Recent unprotected intercourse: yes Genital lesions: no  2. IRREGULAR BLEEDING: Today patient reports concerns of irregular periods and irregular bleeding. Reports this is new for her. Reports she had a period in May 2021, none in June 2021, and two periods in July 2021. Reports she has not had a period for August 2021 yet. Also has concerns about bleeding in between periods.   3. CONSTIPATION: Onset: months  Abdominal Pain: yes and reports this is normal Watery stool: denies Diet consisting of water, fiber, fruit, and veggies: yes OTC medications: Miralax and helping Nausea Vomiting: denies Blood in stool or with straining: with every bowel movement and the last time being 5 days ago.  4. FACIAL RASH: Today patient reports concerns of bilateral rash on cheeks. Reports this started at least 1 month ago. Reports she has tried over-the-counter medications and they are not helping. States the rash does not itch. Requesting referral to Dermatology.   Current Outpatient Medications on File Prior to Visit  Medication Sig Dispense Refill  . ferrous sulfate 325 (65 FE) MG tablet Take 1 tablet (325 mg total) by mouth daily with breakfast. (Patient not taking: Reported on 02/09/2018) 90 tablet 3  . metroNIDAZOLE (FLAGYL) 500 MG tablet Take 1 tablet (500 mg total) by mouth 2 (two) times daily. (Patient not taking: Reported on 05/31/2018) 14 tablet 0  . naproxen (NAPROSYN) 500 MG tablet  Take 1 tablet (500 mg total) by mouth 2 (two) times daily with a meal. As needed for pain (Patient not taking: Reported on 02/09/2018) 30 tablet 4  . tranexamic acid (LYSTEDA) 650 MG TABS tablet Take 2 tablets (1,300 mg total) by mouth 3 (three) times daily. Take during menses for a maximum of five days (Patient not taking: Reported on 02/09/2018) 30 tablet 4  . triamcinolone cream (KENALOG) 0.1 % Apply 1 application topically 2 (two) times daily. (Patient not taking: Reported on 05/31/2018) 30 g 0  . Vitamin D, Ergocalciferol, (DRISDOL) 50000 units CAPS capsule Take 1 capsule (50,000 Units total) by mouth every 7 (seven) days. (Patient not taking: Reported on 05/31/2018) 12 capsule 0   No current facility-administered medications on file prior to visit.    No Known Allergies  Social History   Socioeconomic History  . Marital status: Single    Spouse name: Not on file  . Number of children: Not on file  . Years of education: Not on file  . Highest education level: Not on file  Occupational History  . Not on file  Tobacco Use  . Smoking status: Never Smoker  . Smokeless tobacco: Never Used  Vaping Use  . Vaping Use: Never used  Substance and Sexual Activity  . Alcohol use: Yes    Comment: occasionally   . Drug use: No  . Sexual activity: Not Currently    Birth control/protection: I.U.D.  Other Topics Concern  . Not on file  Social History Narrative  . Not on file   Social Determinants of Health   Financial Resource Strain:   .  Difficulty of Paying Living Expenses: Not on file  Food Insecurity:   . Worried About Charity fundraiser in the Last Year: Not on file  . Ran Out of Food in the Last Year: Not on file  Transportation Needs:   . Lack of Transportation (Medical): Not on file  . Lack of Transportation (Non-Medical): Not on file  Physical Activity:   . Days of Exercise per Week: Not on file  . Minutes of Exercise per Session: Not on file  Stress:   . Feeling of Stress :  Not on file  Social Connections:   . Frequency of Communication with Friends and Family: Not on file  . Frequency of Social Gatherings with Friends and Family: Not on file  . Attends Religious Services: Not on file  . Active Member of Clubs or Organizations: Not on file  . Attends Archivist Meetings: Not on file  . Marital Status: Not on file  Intimate Partner Violence:   . Fear of Current or Ex-Partner: Not on file  . Emotionally Abused: Not on file  . Physically Abused: Not on file  . Sexually Abused: Not on file    Family History  Problem Relation Age of Onset  . Diabetes Mother     No past surgical history on file.  ROS: Review of Systems Negative except as stated above  PHYSICAL EXAM: Vitals with BMI 06/06/2020 10/26/2019 05/31/2018  Height 5\' 5"  - 5\' 7"   Weight 155 lbs 10 oz - 158 lbs 6 oz  BMI 56.43 - 32.9  Systolic 518 841 96  Diastolic 72 63 60  Pulse 57 67 65   Wt Readings from Last 3 Encounters:  06/06/20 155 lb 9.6 oz (70.6 kg)  05/31/18 158 lb 6.4 oz (71.8 kg)  02/23/18 164 lb 3.2 oz (74.5 kg)    Physical Exam General appearance - alert, well appearing, and in no distress and oriented to person, place, and time Mental status - alert, oriented to person, place, and time, normal mood, behavior, speech, dress, motor activity, and thought processes Neck - supple, no significant adenopathy Lymphatics - no palpable lymphadenopathy, no hepatosplenomegaly Chest - clear to auscultation, no wheezes, rales or rhonchi, symmetric air entry, no tachypnea, retractions or cyanosis Heart - normal rate, regular rhythm, normal S1, S2, no murmurs, rubs, clicks or gallops Pelvic - exam declined by the patient Neurological - alert, oriented, normal speech, no focal findings or movement disorder noted  ASSESSMENT AND PLAN: 1. Vaginal discharge: - Cervicovaginal ancillary for sexually transmitted infection screening.  - Today urinalysis negative for nitrites. -  Empirically treat for yeast infection with Fluconazole. - Follow-up with primary provider as needed. - POCT URINALYSIS DIP (CLINITEK) - Cervicovaginal ancillary only - fluconazole (DIFLUCAN) 150 MG tablet; Take 1 tablet (150 mg total) by mouth once for 1 dose.  Dispense: 1 tablet; Refill: 0  2. Blood in stool: - Reports she is having constipation with straining and blood in stool for several months. Reports blood in stool occurs with each bowel movement.  - Reports she is taking over-the-counter Miralax which helps some with constipation.  - Referral to Gastroenterology for further evaluation and management. - Ambulatory referral to Gastroenterology  - Patient Education:   Increase water and fiber intake with natural foods such as apples, pears, cherries, raisins, grapes, and nuts.  Fiber supplements purchased over-the-counter such as Metamucil or Benefiber can improve symptoms of constipation.  Suppositories purchased over-the-counter such as glycerin or bisacodyl may be considered  if no relief from fiber supplements.   Encouraged patient to not use laxatives excessively as this may contribute to rebound constipation, hypokalemia, and salt depletion  Counseled patient to try to defecate after meals thereby taking advantage of normal postprandial increases in colonic motility  3. Facial rash: - Today patient reports concerns of bilateral rash on cheeks. Reports this started at least 1 month ago. Reports she has tried over-the-counter medications and they are not helping. States the rash does not itch. Requesting referral to Dermatology. - Per patient request referral to Dermatology for further evaluation and management. - Ambulatory referral to Dermatology  4. Language barrier: - [employer] participated during today's visit.  - Interpreter Name: Christy Sartorius ID#: [id number] - Interpreter Name: McIntosh, ID#: [id number]   Patient was given the opportunity to ask questions.  Patient  verbalized understanding of the plan and was able to repeat key elements of the plan. Patient was given clear instructions to go to Emergency Department or return to medical center if symptoms don't improve, worsen, or new problems develop.The patient verbalized understanding.   Camillia Herter, NP

## 2020-06-06 NOTE — Patient Instructions (Addendum)
Urinalysis and STI screening today. Fluconazole for possible yeast infection. Referral to Gastroenterology. Referral to Dermatology. Follow-up with primary provider as needed.  Anlisis de [place] y exmenes de deteccin de ITS en la actualidad. Fluconazol para una posible infeccin por hongos. Derivacin a IT sales professional. Derivacin a Psychologist, forensic. Haga un seguimiento con el proveedor de atencin primaria segn sea necesario. Infecciones por hongos en la piel Skin Yeast Infection  La infeccin por hongos en la piel es un trastorno en el que hay un desarrollo excesivo de unos hongos (cndida) que viven normalmente en la piel. Por lo general, este tipo de infeccin ocurre en reas de la piel que estn constantemente clidas y hmedas, como las axilas o la ingle. Cules son las causas? La causa de la afeccin es un cambio en el equilibrio normal de los hongos y las bacterias que viven en la piel. Qu incrementa el riesgo? Es ms probable que tenga esta afeccin si:  Tiene obesidad.  Est embarazada.  Toma anticonceptivos orales.  Tiene diabetes.  Toma antibiticos.  Toma medicamentos con corticoesteroides.  Est desnutrido.  Tiene debilitado el sistema de defensa del organismo (sistema inmunitario).  Tiene 65aos o ms.  [place] ropa ajustada. Cules son los signos o los sntomas? El sntoma ms frecuente de esta afeccin es picazn en la zona afectada. Otros sntomas pueden incluir los siguientes:  Zona de la piel roja e hinchada.  Bultos en la piel. Cmo se diagnostica?  Esta afeccin se diagnostica mediante una revisin de los antecedentes mdicos y un examen fsico.  El mdico puede raspar ligeramente la piel para tomar Truddie Coco y analizarla con un microscopio a fin de Office manager presencia de hongos. Cmo se trata? Esta afeccin se trata con medicamentos. [place] pueden ser de venta libre o con receta. Estos medicamentos pueden administrarse de la  siguiente manera:  Por boca (va oral).  Mediante aplicacin en la piel, en forma de crema o polvo. Siga estas indicaciones en su casa:   Tome o aplquese los medicamentos de venta libre y los recetados solamente como se lo haya indicado el mdico.  Mantenga un peso saludable. Si necesita ayuda para bajar de peso, hable con el mdico.  Mantenga la piel limpia y [place].  Si tiene diabetes, mantenga bajo control el nivel de Dispensing optician.  Concurra a todas las visitas de control como se lo haya indicado el mdico. Esto es importante. Comunquese con un mdico si:  Los sntomas desaparecen y luego vuelven a Arts administrator.  Los sntomas no mejoran con Dispensing optician.  Sus sntomas empeoran.  La erupcin se extiende.  Tiene fiebre o escalofros.  Aparecen nuevos sntomas.  Tiene una nueva zona de enrojecimiento o calor en la piel. Resumen  La infeccin por hongos en la piel es un trastorno en el que hay un desarrollo excesivo de unos hongos (cndida) que viven normalmente en la piel. La causa de la afeccin es un cambio en el equilibrio normal de los hongos y las bacterias que viven en la piel.  Tome o aplquese los medicamentos de venta libre y los recetados solamente como se lo haya indicado el mdico.  Mantenga la piel limpia y [place].  Comunquese con un mdico si los sntomas no mejoran con el tratamiento. Esta informacin no tiene Marine scientist el consejo del mdico. Asegrese de hacerle al mdico cualquier pregunta que tenga. Document Revised: 04/06/2018 Document Reviewed: 04/06/2018 Elsevier Patient Education  [place].

## 2020-06-09 ENCOUNTER — Telehealth: Payer: Self-pay

## 2020-06-09 ENCOUNTER — Encounter: Payer: Self-pay | Admitting: Physician Assistant

## 2020-06-09 LAB — CERVICOVAGINAL ANCILLARY ONLY
Bacterial Vaginitis (gardnerella): NEGATIVE
Candida Glabrata: NEGATIVE
Candida Vaginitis: POSITIVE — AB
Chlamydia: POSITIVE — AB
Comment: NEGATIVE
Comment: NEGATIVE
Comment: NEGATIVE
Comment: NEGATIVE
Comment: NEGATIVE
Comment: NORMAL
Neisseria Gonorrhea: NEGATIVE
Trichomonas: NEGATIVE

## 2020-06-09 MED ORDER — AZITHROMYCIN 500 MG PO TABS: 1000.0000 mg | ORAL_TABLET | Freq: Every day | ORAL | 0 refills | Status: AC

## 2020-06-09 NOTE — Telephone Encounter (Signed)
[employer] Mardene Celeste  Id# [id number]  contacted pt to go over lab results pt didn't answer lvm asking pt to give a call back at their earliest convenience

## 2020-06-09 NOTE — Addendum Note (Signed)
Addended by: Camillia Herter on: 06/09/2020 03:22 PM   Modules accepted: Orders

## 2020-06-09 NOTE — Progress Notes (Signed)
No UTI.   Pending cervicovaginal ancillary.

## 2020-06-09 NOTE — Progress Notes (Signed)
Please call patient with update.   Patient has Chlamydia. Azithromycin prescribed and sent to pharmacy on file.   Both patient and her partner need to be treated for Chlamydia. Also, both need to complete treatment prior to having unprotected sex again. Counseled to not cosume alcohol while taking prescribed antibotic.   Candida vaginitis discussed and treated empirically while in clinic.

## 2020-06-10 ENCOUNTER — Telehealth: Payer: Self-pay

## 2020-06-10 NOTE — Telephone Encounter (Signed)
[employer] Garlon Hatchet  Id# [id number]  contacted pt to go over swab results pt is aware and doesn't have any questions or concerns

## 2020-06-13 ENCOUNTER — Ambulatory Visit: Payer: Self-pay

## 2020-06-13 ENCOUNTER — Other Ambulatory Visit: Payer: Self-pay

## 2020-07-15 ENCOUNTER — Ambulatory Visit (INDEPENDENT_AMBULATORY_CARE_PROVIDER_SITE_OTHER): Payer: Self-pay | Admitting: Physician Assistant

## 2020-07-15 ENCOUNTER — Other Ambulatory Visit: Payer: Self-pay | Admitting: Physician Assistant

## 2020-07-15 ENCOUNTER — Encounter: Payer: Self-pay | Admitting: Physician Assistant

## 2020-07-15 ENCOUNTER — Other Ambulatory Visit (INDEPENDENT_AMBULATORY_CARE_PROVIDER_SITE_OTHER): Payer: Self-pay

## 2020-07-15 VITALS — BP 104/70 | HR 78 | Ht 65.0 in | Wt 154.0 lb

## 2020-07-15 DIAGNOSIS — K5900 Constipation, unspecified: Secondary | ICD-10-CM

## 2020-07-15 DIAGNOSIS — K625 Hemorrhage of anus and rectum: Secondary | ICD-10-CM

## 2020-07-15 DIAGNOSIS — R1030 Lower abdominal pain, unspecified: Secondary | ICD-10-CM

## 2020-07-15 LAB — CBC WITH DIFFERENTIAL/PLATELET
Basophils Absolute: 0 10*3/uL (ref 0.0–0.1)
Basophils Relative: 0.6 % (ref 0.0–3.0)
Eosinophils Absolute: 0.1 10*3/uL (ref 0.0–0.7)
Eosinophils Relative: 1.5 % (ref 0.0–5.0)
HCT: 27.6 % — ABNORMAL LOW (ref 36.0–46.0)
Hemoglobin: 8.6 g/dL — ABNORMAL LOW (ref 12.0–15.0)
Lymphocytes Relative: 31.3 % (ref 12.0–46.0)
Lymphs Abs: 1.6 10*3/uL (ref 0.7–4.0)
MCHC: 31.2 g/dL (ref 30.0–36.0)
MCV: 63.4 fl — ABNORMAL LOW (ref 78.0–100.0)
Monocytes Absolute: 0.4 10*3/uL (ref 0.1–1.0)
Monocytes Relative: 7.3 % (ref 3.0–12.0)
Neutro Abs: 2.9 10*3/uL (ref 1.4–7.7)
Neutrophils Relative %: 59.3 % (ref 43.0–77.0)
Platelets: 294 10*3/uL (ref 150.0–400.0)
RBC: 4.35 Mil/uL (ref 3.87–5.11)
RDW: 17.6 % — ABNORMAL HIGH (ref 11.5–15.5)
WBC: 5 10*3/uL (ref 4.0–10.5)

## 2020-07-15 LAB — SEDIMENTATION RATE: Sed Rate: 15 mm/hr (ref 0–20)

## 2020-07-15 LAB — TSH: TSH: 0.71 u[IU]/mL (ref 0.35–4.50)

## 2020-07-15 MED ORDER — PLENVU 140 G PO SOLR: 1.0000 | ORAL | 0 refills | Status: DC

## 2020-07-15 NOTE — Patient Instructions (Signed)
Si tiene 65 aos o ms, su ndice de [employer] corporal debe estar entre 23-30. Su ndice de masa corporal es de 25,63 kg / m. Si esto est fuera del rango mencionado anteriormente, considere hacer un seguimiento con su Proveedor de Midwife.  Si tiene 59 aos o menos, su ndice de [employer] corporal debe estar entre 19-25. Su ndice de masa corporal es de 25,63 kg / m. Si esto est fuera del rango mencionado anteriormente, considere hacer un seguimiento con su Proveedor de Midwife.  Su proveedor ha [employer] que vaya al nivel del stano para realizar anlisis de laboratorio antes de salir hoy. Presione "B" en el ascensor. El laboratorio est ubicado en la primera puerta a la izquierda al salir del Materials engineer.  Se le ha programado una colonoscopia. Siga las instrucciones escritas que se le dieron en su visita de hoy. Recoja sus suministros de preparacin en la farmacia dentro de los prximos 1-3 das. Si [place] inhaladores (aunque solo sea necesario), trigalos el da de su procedimiento.  Contine con 17 gramos de Miralax en 8 onzas de agua o jugo al [employer].  Intente usar Gas-X segn necesite gas / hinchazn.  Minimiza la lactosa. Deje de edulcorantes artificiales. COMIENCE una dieta baja en gases.  Seguimiento pendiente de los resultados de su colonoscopia.

## 2020-07-15 NOTE — Progress Notes (Signed)
Subjective:    Patient ID: Natalie Aguilar, female    DOB: 1981-05-11, 39 y.o.   MRN: [id number]  HPI Natalie Aguilar is a pleasant 39 year old non-English-speaking Hispanic female, new to GI today referred by Gypsy Balsam, NP [facility] for evaluation of constipation and hematochezia. Patient has not had any prior GI evaluation. She says her current symptoms started in February 2021 with constipation which became progressively worse.  She says she had a lot of straining with constipation and then started noticing blood with her bowel movements.  Eventually she also started noticing pain in the lower abdomen primarily in the left.   Her symptoms seem to be somewhat worse around the time of her menstrual cycle. She is not aware of any triggers for the constipation, no changes in diet medications etc.  She was noticing bright red blood with her bowel movements on a fairly regular basis.  On further questioning she was also having some rectal pain around that time and says that it was sharp pain with bowel movements which lasted for multiple weeks. She was seen by primary care and started on MiraLAX.  Bowel movements have been much better now over the past month, she is having regular bowel movements without straining.  She has not noticed any blood with her bowel movements over the past few weeks, and the rectal discomfort has resolved.  She continues to describe some lower abdominal discomfort and says she is also been noticing more gas and bloating postprandially. Has not had any recent labs or imaging. Family history negative for colon cancer or polyps inflammatory bowel disease as far she is aware. Generally in good health..  Review of Systems Pertinent positive and negative review of systems were noted in the above HPI section.  All other review of systems was otherwise negative.  Outpatient Encounter Medications as of 07/15/2020  Medication Sig  . polyethylene glycol  (MIRALAX / GLYCOLAX) 17 g packet Take 17 g by mouth daily.  Marland Kitchen PEG-KCl-NaCl-NaSulf-Na Asc-C (PLENVU) 140 g SOLR Take 1 kit by mouth as directed.  . [DISCONTINUED] ferrous sulfate 325 (65 FE) MG tablet Take 1 tablet (325 mg total) by mouth daily with breakfast. (Patient not taking: Reported on 02/09/2018)  . [DISCONTINUED] metroNIDAZOLE (FLAGYL) 500 MG tablet Take 1 tablet (500 mg total) by mouth 2 (two) times daily. (Patient not taking: Reported on 05/31/2018)  . [DISCONTINUED] naproxen (NAPROSYN) 500 MG tablet Take 1 tablet (500 mg total) by mouth 2 (two) times daily with a meal. As needed for pain (Patient not taking: Reported on 02/09/2018)  . [DISCONTINUED] tranexamic acid (LYSTEDA) 650 MG TABS tablet Take 2 tablets (1,300 mg total) by mouth 3 (three) times daily. Take during menses for a maximum of five days (Patient not taking: Reported on 02/09/2018)  . [DISCONTINUED] triamcinolone cream (KENALOG) 0.1 % Apply 1 application topically 2 (two) times daily. (Patient not taking: Reported on 05/31/2018)  . [DISCONTINUED] Vitamin D, Ergocalciferol, (DRISDOL) 50000 units CAPS capsule Take 1 capsule (50,000 Units total) by mouth every 7 (seven) days. (Patient not taking: Reported on 05/31/2018)   No facility-administered encounter medications on file as of 07/15/2020.   No Known Allergies There are no problems to display for this patient.  Social History   Socioeconomic History  . Marital status: Single    Spouse name: Not on file  . Number of children: Not on file  . Years of education: Not on file  . Highest education level: Not on  file  Occupational History  . Not on file  Tobacco Use  . Smoking status: Never Smoker  . Smokeless tobacco: Never Used  Vaping Use  . Vaping Use: Never used  Substance and Sexual Activity  . Alcohol use: Yes    Comment: occasionally   . Drug use: No  . Sexual activity: Not Currently    Birth control/protection: I.U.D.  Other Topics Concern  . Not on file  Social  History Narrative  . Not on file   Social Determinants of Health   Financial Resource Strain:   . Difficulty of Paying Living Expenses: Not on file  Food Insecurity:   . Worried About Charity fundraiser in the Last Year: Not on file  . Ran Out of Food in the Last Year: Not on file  Transportation Needs:   . Lack of Transportation (Medical): Not on file  . Lack of Transportation (Non-Medical): Not on file  Physical Activity:   . Days of Exercise per Week: Not on file  . Minutes of Exercise per Session: Not on file  Stress:   . Feeling of Stress : Not on file  Social Connections:   . Frequency of Communication with Friends and Family: Not on file  . Frequency of Social Gatherings with Friends and Family: Not on file  . Attends Religious Services: Not on file  . Active Member of Clubs or Organizations: Not on file  . Attends Archivist Meetings: Not on file  . Marital Status: Not on file  Intimate Partner Violence:   . Fear of Current or Ex-Partner: Not on file  . Emotionally Abused: Not on file  . Physically Abused: Not on file  . Sexually Abused: Not on file    Natalie Aguilar's family history includes Diabetes in her mother.      Objective:    Vitals:   07/15/20 1441  BP: 104/70  Pulse: 78    Physical Exam Well-developed well-nourished Hispanic female in no acute distress.  Height, Weight, 154 BMI 25.6 accompanied by interpreter  HEENT; nontraumatic normocephalic, EOMI, PE RR LA, sclera anicteric. Oropharynx; not examined  neck; supple, no JVD Cardiovascular; regular rate and rhythm with S1-S2, no murmur rub or gallop Pulmonary; Clear bilaterally Abdomen; soft, there is mild tenderness in the left lower quadrant no guarding, nondistended, no palpable mass or hepatosplenomegaly, bowel sounds are active Rectal; not done today Skin; benign exam, no jaundice rash or appreciable lesions Extremities; no clubbing cyanosis or edema skin warm and  dry Neuro/Psych; alert and oriented x4, grossly nonfocal mood and affect appropriate       Assessment & Plan:   #[birth year] 39 year old non-English-speaking Hispanic female with 32-monthhistory of new onset of constipation, followed by lower abdominal pain and bright red blood noted with bowel movements.  Patient also had anal rectal pain with bowel movements which lasted for multiple weeks. No previous issues with any GI symptoms or constipation. Symptoms have improved since addition of MiraLAX, bowel movements more regular and no bleeding noted over the past few weeks.  Rectal discomfort has also resolved.  She continues to complain of some lower abdominal discomfort primarily on the left, and postprandial abdominal bloating and gas.  Hematochezia may have been secondary to anal fissure, consider bleeding secondary to internal hemorrhoids, rule out occult colon lesion.  Plan; continue MiraLAX 17 g in 8 ounces of water daily. Liberal water intake at least 60 ounces per day. Advised trial of Gas-X as needed with meals.  Check CBC, sed rate, and TSH. Patient will be scheduled for colonoscopy with Dr. Loletha Carrow.  Procedure was discussed in detail with the patient including indications risks and benefits and she is agreeable to proceed.  She has completed COVID-19 vaccination.   Natalie Romano Genia Harold PA-C 07/15/2020   Cc: Gildardo Pounds, NP

## 2020-07-16 NOTE — Progress Notes (Signed)
____________________________________________________________  Attending physician addendum:  Thank you for sending this case to me. I have reviewed the entire note, and the outlined plan seems appropriate.  Cataleia Gade Danis, MD  ____________________________________________________________  

## 2020-07-17 ENCOUNTER — Other Ambulatory Visit: Payer: Self-pay

## 2020-07-17 ENCOUNTER — Other Ambulatory Visit: Payer: Self-pay | Admitting: Internal Medicine

## 2020-07-17 ENCOUNTER — Ambulatory Visit: Payer: Self-pay | Attending: Internal Medicine | Admitting: Internal Medicine

## 2020-07-17 ENCOUNTER — Ambulatory Visit: Payer: Self-pay | Admitting: Internal Medicine

## 2020-07-17 DIAGNOSIS — Z1159 Encounter for screening for other viral diseases: Secondary | ICD-10-CM

## 2020-07-17 DIAGNOSIS — Z114 Encounter for screening for human immunodeficiency virus [HIV]: Secondary | ICD-10-CM

## 2020-07-17 DIAGNOSIS — N760 Acute vaginitis: Secondary | ICD-10-CM

## 2020-07-17 MED ORDER — FLUCONAZOLE 150 MG PO TABS: 150.0000 mg | ORAL_TABLET | Freq: Every day | ORAL | 0 refills | Status: DC

## 2020-07-17 NOTE — Progress Notes (Signed)
Virtual Visit via Telephone Note Due to current restrictions/limitations of in-office visits due to the COVID-19 pandemic, this scheduled clinical appointment was converted to a telehealth visit  I connected with Natalie Aguilar on 07/17/20 at 11:56 a.m by telephone and verified that I am speaking with the correct person using two identifiers. I am in my office.  The patient is at home.  Only the patient, myself and Terrence Dupont from [employer] [phone] participated in this encounter.  I discussed the limitations, risks, security and privacy concerns of performing an evaluation and management service by telephone and the availability of in person appointments. I also discussed with the patient that there may be a patient responsible charge related to this service. The patient expressed understanding and agreed to proceed.   History of Present Illness: This is an UC visit for vaginal dischg. C/o white vaginal dischg and vaginal itching.  Dx and treated for yeast and Chlamydia end of August.  Symptoms got better but did not go away completely. She endorses that both she and her partner were treated for the Chlamydia before they had unprotected sex. Some pain during intercourse.    Observations/Objective:   Assessment and Plan: 1. Recurrent vaginitis The white vaginal discharge with itching is most likely due to recurrent yeast.  I will treat empirically with Diflucan this time treating for 2 days instead of a 1 day dose. -She will come to the lab to do a vaginal swab for STD screen and to see whether the chlamydia infection has resolved.  Inform patient that sometimes untreated STDs can cause pain during intercourse but she should follow-up with her PCP on this - Cervicovaginal ancillary only - fluconazole (DIFLUCAN) 150 MG tablet; Take 1 tablet (150 mg total) by mouth daily.  Dispense: 2 tablet; Refill: 0  2. Screening for HIV (human immunodeficiency virus) She is agreeable to HIV  screening. - HIV Antibody (routine testing w rflx)  3. Need for hepatitis C screening test She is agreeable to hepatitis C screening. - Hepatitis C Antibody   Follow Up Instructions: With PCP   I discussed the assessment and treatment plan with the patient. The patient was provided an opportunity to ask questions and all were answered. The patient agreed with the plan and demonstrated an understanding of the instructions.   The patient was advised to call back or seek an in-person evaluation if the symptoms worsen or if the condition fails to improve as anticipated.  I provided 15 minutes of non-face-to-face time during this encounter.   Karle Plumber, MD

## 2020-07-17 NOTE — Progress Notes (Signed)
Pt states when she has sexual relations it hurts

## 2020-07-18 LAB — CERVICOVAGINAL ANCILLARY ONLY
Bacterial Vaginitis (gardnerella): NEGATIVE
Candida Glabrata: NEGATIVE
Candida Vaginitis: POSITIVE — AB
Chlamydia: NEGATIVE
Comment: NEGATIVE
Comment: NEGATIVE
Comment: NEGATIVE
Comment: NEGATIVE
Comment: NEGATIVE
Comment: NORMAL
Neisseria Gonorrhea: NEGATIVE
Trichomonas: NEGATIVE

## 2020-07-18 LAB — HIV ANTIBODY (ROUTINE TESTING W REFLEX): HIV Screen 4th Generation wRfx: NONREACTIVE

## 2020-07-18 LAB — HEPATITIS C ANTIBODY: Hep C Virus Ab: 0.3 s/co ratio (ref 0.0–0.9)

## 2020-07-22 ENCOUNTER — Encounter: Payer: Self-pay | Admitting: Gastroenterology

## 2020-07-22 ENCOUNTER — Other Ambulatory Visit: Payer: Self-pay | Admitting: Nurse Practitioner

## 2020-07-22 ENCOUNTER — Other Ambulatory Visit: Payer: Self-pay

## 2020-07-22 ENCOUNTER — Ambulatory Visit (AMBULATORY_SURGERY_CENTER): Payer: Self-pay | Admitting: Gastroenterology

## 2020-07-22 VITALS — BP 104/60 | HR 54 | Temp 98.0°F | Resp 13 | Ht 65.0 in | Wt 154.0 lb

## 2020-07-22 DIAGNOSIS — R1030 Lower abdominal pain, unspecified: Secondary | ICD-10-CM

## 2020-07-22 DIAGNOSIS — D125 Benign neoplasm of sigmoid colon: Secondary | ICD-10-CM

## 2020-07-22 DIAGNOSIS — D500 Iron deficiency anemia secondary to blood loss (chronic): Secondary | ICD-10-CM

## 2020-07-22 DIAGNOSIS — K5900 Constipation, unspecified: Secondary | ICD-10-CM

## 2020-07-22 DIAGNOSIS — K625 Hemorrhage of anus and rectum: Secondary | ICD-10-CM

## 2020-07-22 DIAGNOSIS — N922 Excessive menstruation at puberty: Secondary | ICD-10-CM

## 2020-07-22 HISTORY — PX: COLONOSCOPY: SHX174

## 2020-07-22 MED ORDER — SODIUM CHLORIDE 0.9 % IV SOLN: 500.0000 mL | Freq: Once | INTRAVENOUS | Status: DC

## 2020-07-22 NOTE — Progress Notes (Signed)
To pacu, VSS. Report to Rn.tb 

## 2020-07-22 NOTE — Progress Notes (Signed)
Called to room to assist during endoscopic procedure.  Patient ID and intended procedure confirmed with present staff. Received instructions for my participation in the procedure from the performing physician.  

## 2020-07-22 NOTE — Patient Instructions (Addendum)
Interpreter used today at the [facility] for this pt.  Interpreter's name is-Interpreter used today at the [facility] for this pt.  Interpreter's name is- Ana.  YOU HAD AN ENDOSCOPIC PROCEDURE TODAY AT [facility]:   Refer to the procedure report that was given to you for any specific questions about what was found during the examination.  If the procedure report does not answer your questions, please call your gastroenterologist to clarify.  If you requested that your care partner not be given the details of your procedure findings, then the procedure report has been included in a sealed envelope for you to review at your convenience later.  YOU SHOULD EXPECT: Some feelings of bloating in the abdomen. Passage of more gas than usual.  Walking can help get rid of the air that was put into your GI tract during the procedure and reduce the bloating. If you had a lower endoscopy (such as a colonoscopy or flexible sigmoidoscopy) you may notice spotting of blood in your stool or on the toilet paper. If you underwent a bowel prep for your procedure, you may not have a normal bowel movement for a few days.  Please Note:  You might notice some irritation and congestion in your nose or some drainage.  This is from the oxygen used during your procedure.  There is no need for concern and it should clear up in a day or so.  SYMPTOMS TO REPORT IMMEDIATELY:   Following lower endoscopy (colonoscopy or flexible sigmoidoscopy):  Excessive amounts of blood in the stool  Significant tenderness or worsening of abdominal pains  Swelling of the abdomen that is new, acute  Fever of 100F or higher   For urgent or emergent issues, a gastroenterologist can be reached at any hour by calling [phone]. Do not use MyChart messaging for urgent concerns.    DIET:  We do recommend a small meal at first, but then you may proceed to your regular diet.  Drink plenty of fluids but  you should avoid alcoholic beverages for 24 hours.  ACTIVITY:  You should plan to take it easy for the rest of today and you should NOT DRIVE or use heavy machinery until tomorrow (because of the sedation medicines used during the test).    FOLLOW UP: Our staff will call the number listed on your records 48-72 hours following your procedure to check on you and address any questions or concerns that you may have regarding the information given to you following your procedure. If we do not reach you, we will leave a message.  We will attempt to reach you two times.  During this call, we will ask if you have developed any symptoms of COVID 19. If you develop any symptoms (ie: fever, flu-like symptoms, shortness of breath, cough etc.) before then, please call [phone].  If you test positive for Covid 19 in the 2 weeks post procedure, please call and report this information to [place].    If any biopsies were taken you will be contacted by phone or by letter within the next 1-3 weeks.  Please call us at [phone] if you have not heard about the biopsies in 3 weeks.    SIGNATURES/CONFIDENTIALITY: You and/or your care partner have signed paperwork which will be entered into your electronic medical record.  These signatures attest to the fact that that the information above on your After Visit Summary has been reviewed and is understood.  Full responsibility  of the confidentiality of this discharge information lies with you and/or your care-partner.USTED TUVO UN PROCEDIMIENTO ENDOSCPICO HOY EN EL [facility]:   Lea el informe del procedimiento que se le entreg para cualquier pregunta especfica sobre lo que se Primary school teacher.  Si el informe del examen no responde a sus preguntas, por favor llame a su gastroenterlogo para aclararlo.  Si usted solicit que no se le den [employer] de lo que se Estate manager/land agent en su procedimiento al [employer] va a cuidar, entonces el informe del  procedimiento se ha incluido en un sobre sellado para que usted lo revise despus cuando le sea ms conveniente.   LO QUE PUEDE ESPERAR: Algunas sensaciones de hinchazn en el abdomen.  Puede tener ms gases de lo normal.  El caminar puede ayudarle a eliminar el aire que se le puso en el tracto gastrointestinal durante el procedimiento y reducir la hinchazn.  Si le hicieron una endoscopia inferior (como una colonoscopia o una sigmoidoscopia flexible), podra notar manchas de sangre en las heces fecales o en el papel higinico.  Si se someti a una preparacin intestinal para su procedimiento, es posible que no tenga una evacuacin intestinal normal durante [employer].   Tenga en cuenta:  Es posible que note un poco de irritacin y congestin en la nariz o algn drenaje.  Esto es debido al oxgeno [employer] durante su procedimiento.  No hay que preocuparse y esto debe desaparecer ms o Scientist, research (medical).   SNTOMAS PARA REPORTAR INMEDIATAMENTE:  Despus de una endoscopia inferior (colonoscopia o sigmoidoscopia flexible):  Cantidades excesivas de sangre en las heces fecales  Sensibilidad significativa o empeoramiento de los dolores abdominales   Hinchazn aguda del abdomen que antes no tena   Fiebre de 100F o ms     Para asuntos urgentes o de Freight forwarder, puede comunicarse con un gastroenterlogo a cualquier hora llamando al [phone].  DIETA:  Recomendamos una comida pequea al principio, pero luego puede continuar con su dieta normal.  Tome muchos lquidos, Teacher, adult education las bebidas alcohlicas durante 24 horas.    ACTIVIDAD:  Debe planear tomarse las cosas con calma por el resto del da y no debe CONDUCIR ni usar maquinaria pesada Programmer, applications (debido a los medicamentos de sedacin utilizados durante el examen).     SEGUIMIENTO: Nuestro personal llamar al nmero que aparece en su historial al siguiente da hbil de su procedimiento para ver cmo se siente y para responder cualquier  pregunta o inquietud que pueda tener con respecto a la informacin que se le dio despus del procedimiento. Si no podemos contactarle, le dejaremos un mensaje.  Sin embargo, si se siente bien y no tiene Paediatric nurse, no es necesario que nos devuelva la llamada.  Asumiremos que ha regresado a sus actividades diarias normales sin incidentes. Si se le tomaron algunas biopsias, le contactaremos por telfono o por carta en las prximas 3 semanas.  Si no ha sabido [employer] biopsias en el transcurso de 3 semanas, por favor llmenos al [phone].   FIRMAS/CONFIDENCIALIDAD: Usted y/o el acompaante que le cuide han firmado documentos que se ingresarn en su historial mdico electrnico.  Estas firmas atestiguan el hecho de que la informacin anterior

## 2020-07-22 NOTE — Progress Notes (Signed)
Vital signs checked by:CW & GH    The medical and surgical history was reviewed and verified with the patient.  Interpreter used today at the [facility] for this pt.  Interpreter's name is-Ana

## 2020-07-22 NOTE — Op Note (Signed)
[place] Patient Name: Natalie Aguilar Procedure Date: 07/22/2020 10:09 AM MRN: [id number] Endoscopist: Mallie Mussel L. Loletha Carrow , MD Age: 39 Referring MD:  Date of Birth: [birth year] Gender: Female Account #: [id number] Procedure:                Colonoscopy Indications:              Pelvic pain, Rectal bleeding, Iron deficiency                            anemia (Hgb last week 8.4 , down from 9.6 in Dec                            2018), Constipation - all GI symptoms improved                            using miralax Medicines:                Monitored Anesthesia Care Procedure:                Pre-Anesthesia Assessment:                           - Prior to the procedure, a History and Physical                            was performed, and patient medications and                            allergies were reviewed. The patient's tolerance of                            previous anesthesia was also reviewed. The risks                            and benefits of the procedure and the sedation                            options and risks were discussed with the patient.                            All questions were answered, and informed consent                            was obtained. Prior Anticoagulants: The patient has                            taken no previous anticoagulant or antiplatelet                            agents. ASA Grade Assessment: II - A patient with                            mild systemic disease. After reviewing the risks  and benefits, the patient was deemed in                            satisfactory condition to undergo the procedure.                           After obtaining informed consent, the colonoscope                            was passed under direct vision. Throughout the                            procedure, the patient's blood pressure, pulse, and                            oxygen saturations were monitored continuously. The                             Colonoscope was introduced through the anus and                            advanced to the the terminal ileum, with                            identification of the appendiceal orifice and IC                            valve. The colonoscopy was performed without                            difficulty. The patient tolerated the procedure                            well. The quality of the bowel preparation was                            excellent. The terminal ileum, ileocecal valve,                            appendiceal orifice, and rectum were photographed.                            The bowel preparation used was Plenvu. Scope In: 10:26:24 AM Scope Out: 10:41:27 AM Scope Withdrawal Time: 0 hours 10 minutes 37 seconds  Total Procedure Duration: 0 hours 15 minutes 3 seconds  Findings:                 The perianal and digital rectal examinations were                            normal.                           The terminal ileum appeared normal.  A diminutive polyp was found in the recto-sigmoid                            colon. The polyp was flat. The polyp was removed                            with a cold biopsy forceps. Resection and retrieval                            were complete.                           The exam was otherwise without abnormality on                            direct and retroflexion views. Complications:            No immediate complications. Estimated Blood Loss:     Estimated blood loss was minimal. Impression:               - The examined portion of the ileum was normal.                           - One diminutive polyp at the recto-sigmoid colon,                            removed with a cold biopsy forceps. Resected and                            retrieved.                           - The examination was otherwise normal on direct                            and retroflexion views.                            This patient's IDA dating back > 10 years appears                            to be from menorrhagia. Recommendation:           - Patient has a contact number available for                            emergencies. The signs and symptoms of potential                            delayed complications were discussed with the                            patient. Return to normal activities tomorrow.                            Written discharge instructions  were provided to the                            patient.                           - Resume previous diet.                           - Continue present medications.                           - Await pathology results.                           - Repeat colonoscopy is recommended for                            surveillance. The colonoscopy date will be                            determined after pathology results from today's                            exam become available for review.                           - Begin OTC iron sulfate 325 mg , one tablet twice                            daily.                           - Return to primary care physician at appointment                            to be schedule to follow hemoglobin and iron levels                            to dose iron accordingly and consider gynecology                            evaluation. (endoscopist will message PCP) Estill Cotta. Loletha Carrow, MD 07/22/2020 10:54:42 AM This report has been signed electronically.

## 2020-07-24 ENCOUNTER — Telehealth: Payer: Self-pay

## 2020-07-24 NOTE — Telephone Encounter (Signed)
°  Follow up Call-  Call back number 07/22/2020  Post procedure Call Back phone  # [phone]  Permission to leave phone message Yes  Some recent data might be hidden     Patient questions:  Do you have a fever, pain , or abdominal swelling? No. Pain Score  0 *  Have you tolerated food without any problems? Yes.    Have you been able to return to your normal activities? Yes.    Do you have any questions about your discharge instructions: Diet   No. Medications  No. Follow up visit  No.  Do you have questions or concerns about your Care? No.  Actions: * If pain score is 4 or above: 1. No action needed, pain <4.Have you developed a fever since your procedure? no  2.   Have you had an respiratory symptoms (SOB or cough) since your procedure? no  3.   Have you tested positive for COVID 19 since your procedure no  4.   Have you had any family members/close contacts diagnosed with the COVID 19 since your procedure?  no   If yes to any of these questions please route to Joylene John, RN and Joella Prince, RN

## 2020-07-29 ENCOUNTER — Encounter: Payer: Self-pay | Admitting: Gastroenterology

## 2020-08-18 ENCOUNTER — Ambulatory Visit: Payer: Self-pay | Attending: Nurse Practitioner

## 2020-08-18 ENCOUNTER — Other Ambulatory Visit: Payer: Self-pay

## 2020-08-18 ENCOUNTER — Other Ambulatory Visit: Payer: Self-pay | Admitting: Nurse Practitioner

## 2020-08-18 DIAGNOSIS — D500 Iron deficiency anemia secondary to blood loss (chronic): Secondary | ICD-10-CM

## 2020-08-19 ENCOUNTER — Other Ambulatory Visit: Payer: Self-pay | Admitting: Nurse Practitioner

## 2020-08-19 LAB — CBC
Hematocrit: 32.3 % — ABNORMAL LOW (ref 34.0–46.6)
Hemoglobin: 9.5 g/dL — ABNORMAL LOW (ref 11.1–15.9)
MCH: 19.8 pg — ABNORMAL LOW (ref 26.6–33.0)
MCHC: 29.4 g/dL — ABNORMAL LOW (ref 31.5–35.7)
MCV: 67 fL — ABNORMAL LOW (ref 79–97)
Platelets: 326 10*3/uL (ref 150–450)
RBC: 4.81 x10E6/uL (ref 3.77–5.28)
RDW: 20.8 % — ABNORMAL HIGH (ref 11.7–15.4)
WBC: 6.6 10*3/uL (ref 3.4–10.8)

## 2020-08-19 LAB — IRON,TIBC AND FERRITIN PANEL
Ferritin: 6 ng/mL — ABNORMAL LOW (ref 15–150)
Iron Saturation: 3 % — CL (ref 15–55)
Iron: 15 ug/dL — ABNORMAL LOW (ref 27–159)
Total Iron Binding Capacity: 461 ug/dL — ABNORMAL HIGH (ref 250–450)
UIBC: 446 ug/dL — ABNORMAL HIGH (ref 131–425)

## 2020-08-19 MED ORDER — FERROUS SULFATE 325 (65 FE) MG PO TBEC: 325.0000 mg | DELAYED_RELEASE_TABLET | Freq: Two times a day (BID) | ORAL | 3 refills | Status: DC

## 2020-08-29 ENCOUNTER — Encounter: Payer: Self-pay | Admitting: Obstetrics and Gynecology

## 2020-08-29 ENCOUNTER — Other Ambulatory Visit (HOSPITAL_COMMUNITY)
Admission: RE | Admit: 2020-08-29 | Discharge: 2020-08-29 | Disposition: A | Discharge status: Home / Self Care | Payer: Self-pay | Source: Ambulatory Visit | Attending: Obstetrics and Gynecology | Admitting: Obstetrics and Gynecology

## 2020-08-29 ENCOUNTER — Other Ambulatory Visit: Payer: Self-pay | Admitting: Obstetrics and Gynecology

## 2020-08-29 ENCOUNTER — Other Ambulatory Visit: Payer: Self-pay

## 2020-08-29 ENCOUNTER — Ambulatory Visit (INDEPENDENT_AMBULATORY_CARE_PROVIDER_SITE_OTHER): Payer: Self-pay | Admitting: Obstetrics and Gynecology

## 2020-08-29 VITALS — BP 132/73 | HR 67 | Wt 157.0 lb

## 2020-08-29 DIAGNOSIS — N761 Subacute and chronic vaginitis: Secondary | ICD-10-CM | POA: Insufficient documentation

## 2020-08-29 DIAGNOSIS — N921 Excessive and frequent menstruation with irregular cycle: Secondary | ICD-10-CM | POA: Insufficient documentation

## 2020-08-29 DIAGNOSIS — B3731 Acute candidiasis of vulva and vagina: Secondary | ICD-10-CM | POA: Insufficient documentation

## 2020-08-29 DIAGNOSIS — B373 Candidiasis of vulva and vagina: Secondary | ICD-10-CM

## 2020-08-29 HISTORY — DX: Excessive and frequent menstruation with irregular cycle: N92.1

## 2020-08-29 MED ORDER — TRANEXAMIC ACID 650 MG PO TABS: 1300.0000 mg | ORAL_TABLET | Freq: Three times a day (TID) | ORAL | 2 refills | Status: DC

## 2020-08-29 MED ORDER — TERCONAZOLE 0.8 % VA CREA: 1.0000 | TOPICAL_CREAM | Freq: Every day | VAGINAL | 0 refills | Status: DC

## 2020-08-29 NOTE — Progress Notes (Signed)
   Subjective:    Patient ID: Natalie Aguilar, female    DOB: 09-15-1981, 39 y.o.   MRN: [id number]  HPI  39 yo G3P3, SVD x 3, seen at [facility].  Pt seen for continued menorrhagia with irregular cycle.  She was last seen in 2019 and was treated with lysteda which apparently helped with the amount of bleeding, but did not help with the duration.  She has tried OCP in the past which  She did not tolerate due to increased facial discoloration and perceived worsening of her moods.  Pt tried mirena and paraguard which both seemed to worsen the bleeding.  Pt notes her menses now last 6-7 days and occur q 5-6 weeks.  Previous labs, ultrasound and notes from Dr. Arther Abbott notes were reviewed.    Review of Systems  Constitutional: Negative.   HENT: Negative.   Eyes: Negative.   Respiratory: Negative.   Cardiovascular: Negative.   Gastrointestinal: Negative.   Genitourinary: Positive for menstrual problem and vaginal discharge.  Musculoskeletal: Negative.   Neurological: Negative.        Objective:   Physical Exam Vitals reviewed.  Constitutional:      Appearance: Normal appearance. She is normal weight.  HENT:     Head: Normocephalic and atraumatic.  Cardiovascular:     Rate and Rhythm: Normal rate and regular rhythm.     Heart sounds: Normal heart sounds.  Pulmonary:     Effort: Pulmonary effort is normal.     Breath sounds: Normal breath sounds.  Abdominal:     General: Abdomen is flat.     Palpations: Abdomen is soft.     Tenderness: There is no abdominal tenderness.  Genitourinary:    General: Normal vulva.     Comments: SSE: cervix WNL, uterus feels normal in size with bimanual exam. Musculoskeletal:        General: Normal range of motion.  Neurological:     Mental Status: She is alert.    Vitals:   08/29/20 1155  BP: 132/73  Pulse: 67         Assessment & Plan:   1. Menorrhagia with irregular cycle Pt has agreed to try lysteda again.  Will reassess  at annual exam in January along with acquiring an endometrial biopsy. - tranexamic acid (LYSTEDA) 650 MG TABS tablet; Take 2 tablets (1,300 mg total) by mouth 3 (three) times daily. Take during menses for a maximum of five days  Dispense: 30 tablet; Refill: 2  2. Candidiasis of vulva and vagina Pt continues with vaginal itching which she attributes to yeast, self swab acquired, but will empirically treat with terconanzol - terconazole (TERAZOL 3) 0.8 % vaginal cream; Place 1 applicator vaginally at bedtime. Apply nightly for three nights.  Dispense: 20 g; Refill: 0  3. Chronic vaginitis  - Cervicovaginal ancillary only( [place])  Pt to follow up in 7 weeks for AE/pap/endometrial biopsy Other treatment options would include uterine ablation or hysterectomy.  Griffin Basil, MD Faculty Attending, Center for [facility]

## 2020-08-29 NOTE — Patient Instructions (Signed)
Biopsia de endometrio Endometrial Biopsy  La biopsia de endometrio es un procedimiento en el que se toma una [place] de tejido del tero. La muestra se toma del endometrio, que es el revestimiento del tero. A continuacin, la muestra de tejido se revisa en el microscopio para ver si el tejido es normal o anormal. Este procedimiento ayuda a determinar si est en el ciclo menstrual y de qu modo los niveles de hormonas afectan el revestimiento del tero. Este procedimiento tambin se [place] para evaluar el sangrado uterino o para Electrical engineer de endometrio, tuberculosis endometrial, plipos u otras enfermedades inflamatorias. Informe al mdico acerca de lo siguiente:  Cualquier alergia que tenga.  Todos los [employer], incluidos vitaminas, hierbas, gotas oftlmicas, cremas y medicamentos de venta libre.  Cualquier problema previo que usted o los miembros de su familia hayan tenido con anestsicos.  Cualquier enfermedad de la sangre que tenga.  Cirugas previas a las que se someti.  Cualquier enfermedad que tenga.  Si est embarazada o podra estarlo. Cules son los riesgos? En general, se trata de un procedimiento seguro. Sin embargo, pueden ocurrir complicaciones, por ejemplo:  Hemorragia.  Infecciones plvicas.  Puncin en la pared del tero con el dispositivo utilizado para tomar la biopsia (raro). Qu ocurre antes del procedimiento?  Lleve un registro de sus ciclos menstruales segn se lo haya indicado su mdico. Puede ser necesario que programe el procedimiento para un momento especfico del ciclo menstrual.  Puede llevar un apsito sanitario para usar despus del procedimiento.  Consulte al mdico si debe hacer o no lo siguiente: ? Cambiar o suspender los medicamentos que toma habitualmente. Esto es muy importante si toma medicamentos para la diabetes o anticoagulantes. ? Tomar medicamentos como aspirina e ibuprofeno. Estos medicamentos pueden tener un  efecto anticoagulante en la [place]. No tome estos medicamentos antes del procedimiento si su mdico le indica que no lo haga.  Haga planes para que una persona lo lleve a su casa desde el hospital o la clnica. Qu ocurre durante el procedimiento?  Para reducir el riesgo de infecciones: ? El equipo mdico se lavar o se desinfectar las manos.  Deber recostarse en una camilla con los pies y las piernas elevados, como en el examen plvico.  El mdico insertar un instrumento (espculo) en la vagina para observar el cuello del tero.  El cuello del tero ser desinfectado con una solucin antisptica.  Para adormecer el cuello del tero, le aplicarn un medicamento (anestsico local).  Se utilizar un frceps (tenculo) para Contractor cuello del tero firme.  Se insertar un instrumento delgado, similar a una varilla (sonda uterina) a travs del cuello del tero para determinar su longitud y TEFL teacher de donde se tomar la muestra para la biopsia.  A continuacin, se pasa un tubo delgado y flexible (catter) a travs del cuello del tero hasta el tero. El catter se Risk manager para Barista de tejido del endometrio para la biopsia.  Se retirarn el catter y [place], y la [place] se enviar al laboratorio para ser examinada. Qu sucede despus del procedimiento?  Descansar en una sala de recuperacin hasta que est lista para volver a su casa.  Sentir clicos leves y tendr una pequea cantidad de sangrado vaginal. Esto es normal.  Es su responsabilidad retirar los [place] del procedimiento. Pregntele al mdico o a alguien del departamento donde se realice el procedimiento cundo estarn [employer]. Resumen  La biopsia de endometrio es un procedimiento en  el que se toma [place] de tejido del endometrio, que es el revestimiento del tero.  Este procedimiento puede ayudar a Retail buyer problemas del ciclo menstrual, sangrado anormal u otras  afecciones que afectan el endometrio.  Antes del procedimiento, lleve un registro de sus ciclos menstruales segn se lo haya indicado su mdico.  La muestra de tejido extrada se revisar en el microscopio para ver si el tejido es normal o anormal. Esta informacin no tiene Marine scientist el consejo del mdico. Asegrese de hacerle al mdico cualquier pregunta que tenga. Document Revised: 05/23/2017 Document Reviewed: 05/23/2017 Elsevier Patient Education  [place]. Tranexamic acid oral tablets Qu es este medicamento? El CIDO [employer] reduce o detiene el proceso mediante el cual los cogulos sanguneos se descomponen. Este medicamento se [place] para tratar los [employer]. Este medicamento puede ser utilizado para otros usos; si tiene alguna pregunta consulte con su proveedor de atencin mdica o con su farmacutico. MARCAS COMUNES: [place], Lysteda Qu le debo informar a mi profesional de la salud antes de tomar este medicamento? Necesita saber si usted presenta alguno de los siguientes problemas o situaciones: sangrado en el cerebro problemas de coagulacin enfermedad renal problemas de visin una reaccin alrgica o inusual al cido tranexmico, a otros medicamentos, alimentos, colorantes o conservantes si est embarazada o buscando quedar embarazada si est amamantando a un beb Cmo debo utilizar este medicamento? Tome este medicamento por va oral con un vaso de agua. Siga las instrucciones de la etiqueta del [place]. No corte, triture ni mstique este medicamento. Puede tomarlo con o sin alimentos. Si le produce malestar estomacal, tmelo con alimentos. Tome sus dosis a intervalos regulares. No tome su medicamento con una frecuencia mayor a la indicada. No deje de tomarlo excepto si as lo indica su mdico. No tome este medicamento hasta que empieza su perodo. No lo tome durante ms de 5 das en seguidos. No tome este medicamento mientras  no tiene su perodo. Hable con su pediatra para informarse acerca del uso de este medicamento en nios. Aunque este medicamento ha sido recetado a nias tan menores como de 12 aos de edad para condiciones selectivas, las precauciones se aplican. Sobredosis: Pngase en contacto inmediatamente con un centro toxicolgico o una sala de urgencia si usted cree que haya tomado demasiado medicamento. ATENCIN: [employer] es solo para usted. No comparta este medicamento con nadie. Qu sucede si me olvido de una dosis? Si olvida una dosis, tmela cuando se recuerde y tome la prxima dosis por lo menos 6 horas despus. No tome ms de 2 tabletas a la vez para compensar las dosis olvidadas. Qu puede interactuar con este medicamento? No tome esta medicina con ninguno de los siguientes medicamentos: estrgenos pldoras, parches, inyecciones, anillos u otros dispositivos que contienen un estrgeno y [place] progestina Esta medicina tambin puede interactuar con los siguientes medicamentos: ciertos medicamentos usados para ayudar a que la sangre se coagule tretinona (tomado por va oral) Puede ser que esta lista no menciona todas las posibles interacciones. Informe a su profesional de KB Home	[place] de [employer] productos a base de hierbas, medicamentos de [place] o suplementos nutritivos que est tomando. Si usted fuma, consume bebidas alcohlicas o si utiliza drogas ilegales, indqueselo tambin a su profesional de KB Home	[place]. Algunas sustancias pueden interactuar con su medicamento. A qu debo estar atento al usar Coca[employer]a? Informe a su mdico o su profesional de la salud si sus sntomas no comienzan a mejorar o si empeoran. Informe a  su mdico o su profesional de la salud si observa cualquier problema con los ojos mientras recibe [place]. Su mdico le referir a Optician, dispensing de los ojos que [employer] ojos. Qu efectos secundarios puedo tener al [employer] Corporation este medicamento? Efectos  secundarios que debe informar a su mdico o a Barrister's clerk de la salud tan pronto como sea posible: Chief of Staff, como erupcin cutnea, picazn o urticarias, e hinchazn de la cara, los labios o la lengua dificultades para respirar cambios en la visin dolor sbito o grave en el pecho, las piernas, la cabeza o la entrepierna cansancio o debilidad inusual Efectos secundarios que generalmente no requieren Geophysical data processor (infrmelos a su mdico o a Barrister's clerk de la salud si persisten o si son molestos): dolor de espalda dolor de cabeza dolores musculares o articulares problemas sinusales y Chartered loss adjuster estomacal cansancio Puede ser que esta lista no menciona todos los posibles efectos secundarios. Comunquese a su mdico por asesoramiento mdico Humana Inc. Usted puede informar los efectos secundarios a la FDA por telfono al 1-800-FDA-1088. Dnde debo guardar mi medicina? Mantngala fuera del alcance de los nios. [place] a [employer], entre 15 y 11 grados C (27 y 40 grados F). Deseche todo el medicamento que no haya utilizado, despus de la fecha de vencimiento. ATENCIN: Este folleto es un resumen. Puede ser que no cubra toda la posible informacin. Si usted tiene preguntas acerca de esta medicina, consulte con su mdico, su farmacutico o su profesional de Technical sales engineer.  2020 Elsevier/Gold Standard (2016-01-12 00:00:00)

## 2020-09-01 LAB — CERVICOVAGINAL ANCILLARY ONLY
Bacterial Vaginitis (gardnerella): NEGATIVE
Candida Glabrata: NEGATIVE
Candida Vaginitis: POSITIVE — AB
Comment: NEGATIVE
Comment: NEGATIVE
Comment: NEGATIVE
Comment: NEGATIVE
Trichomonas: NEGATIVE

## 2020-09-02 ENCOUNTER — Telehealth: Payer: Self-pay

## 2020-09-02 NOTE — Telephone Encounter (Signed)
[place] appt scheduled for 09/12/20 at 1400; arrive at 1345 with full bladder. Called pt with interpreter Eda. Pt given appt instructions.

## 2020-09-12 ENCOUNTER — Ambulatory Visit: Payer: Self-pay

## 2020-09-17 ENCOUNTER — Ambulatory Visit
Admission: RE | Admit: 2020-09-17 | Discharge: 2020-09-17 | Disposition: A | Discharge status: Home / Self Care | Payer: Self-pay | Source: Ambulatory Visit | Attending: Obstetrics and Gynecology | Admitting: Obstetrics and Gynecology

## 2020-09-17 ENCOUNTER — Other Ambulatory Visit: Payer: Self-pay

## 2020-09-17 DIAGNOSIS — N921 Excessive and frequent menstruation with irregular cycle: Secondary | ICD-10-CM

## 2020-09-18 ENCOUNTER — Ambulatory Visit
Admission: RE | Admit: 2020-09-18 | Discharge: 2020-09-18 | Disposition: A | Discharge status: Home / Self Care | Payer: Self-pay | Source: Ambulatory Visit | Attending: Obstetrics and Gynecology | Admitting: Obstetrics and Gynecology

## 2020-09-18 DIAGNOSIS — N921 Excessive and frequent menstruation with irregular cycle: Secondary | ICD-10-CM | POA: Insufficient documentation

## 2020-10-20 ENCOUNTER — Other Ambulatory Visit: Payer: Self-pay | Admitting: Obstetrics and Gynecology

## 2020-10-20 ENCOUNTER — Other Ambulatory Visit (HOSPITAL_COMMUNITY)
Admission: RE | Admit: 2020-10-20 | Discharge: 2020-10-20 | Disposition: A | Discharge status: Home / Self Care | Payer: Self-pay | Source: Ambulatory Visit | Attending: Obstetrics and Gynecology | Admitting: Obstetrics and Gynecology

## 2020-10-20 ENCOUNTER — Ambulatory Visit (INDEPENDENT_AMBULATORY_CARE_PROVIDER_SITE_OTHER): Payer: Self-pay | Admitting: Obstetrics and Gynecology

## 2020-10-20 ENCOUNTER — Other Ambulatory Visit: Payer: Self-pay

## 2020-10-20 ENCOUNTER — Encounter: Payer: Self-pay | Admitting: *Deleted

## 2020-10-20 ENCOUNTER — Encounter: Payer: Self-pay | Admitting: Obstetrics and Gynecology

## 2020-10-20 VITALS — BP 113/58 | HR 59 | Wt 156.5 lb

## 2020-10-20 DIAGNOSIS — N921 Excessive and frequent menstruation with irregular cycle: Secondary | ICD-10-CM

## 2020-10-20 DIAGNOSIS — Z01419 Encounter for gynecological examination (general) (routine) without abnormal findings: Secondary | ICD-10-CM | POA: Insufficient documentation

## 2020-10-20 DIAGNOSIS — Z23 Encounter for immunization: Secondary | ICD-10-CM

## 2020-10-20 DIAGNOSIS — G8918 Other acute postprocedural pain: Secondary | ICD-10-CM

## 2020-10-20 DIAGNOSIS — Z1231 Encounter for screening mammogram for malignant neoplasm of breast: Secondary | ICD-10-CM

## 2020-10-20 LAB — POCT PREGNANCY, URINE: Preg Test, Ur: NEGATIVE

## 2020-10-20 MED ORDER — TRANEXAMIC ACID 650 MG PO TABS: 1300.0000 mg | ORAL_TABLET | Freq: Three times a day (TID) | ORAL | 10 refills | Status: DC

## 2020-10-20 MED ORDER — IBUPROFEN 800 MG PO TABS
800.0000 mg | ORAL_TABLET | Freq: Once | ORAL | Status: AC
  Administered 2020-10-20: 800 mg via ORAL

## 2020-10-20 NOTE — Progress Notes (Signed)
GYNECOLOGY ANNUAL PREVENTATIVE CARE ENCOUNTER NOTE  History:     Natalie Aguilar is a 40 y.o. G3P3 female here for a routine annual gynecologic exam.  Current complaints: heavy uterine bleeding, improved with lysteda.   Denies discharge, pelvic pain, problems with intercourse or other gynecologic concerns.   She notes her last few menses have been around four days long, but the bleeding was much decreased while using the lysteda.  Pelvic u/s reviewed which showed normal sized uterus with possible adenomyosis.   Gynecologic History Patient's last menstrual period was 09/30/2019 (exact date). Contraception: none Last Pap: 12/30/2017. Results were: normal with negative HPV Last mammogram: never had Obstetric History OB History  No obstetric history on file.    No past medical history on file.  Past Surgical History:  Procedure Laterality Date  . COLONOSCOPY  07/22/2020    Current Outpatient Medications on File Prior to Visit  Medication Sig Dispense Refill  . ferrous sulfate 325 (65 FE) MG EC tablet Take 325 mg by mouth 3 (three) times daily with meals.    . polyethylene glycol (MIRALAX / GLYCOLAX) 17 g packet Take 17 g by mouth daily.    Marland Kitchen terconazole (TERAZOL 3) 0.8 % vaginal cream Place 1 applicator vaginally at bedtime. Apply nightly for three nights. 20 g 0   No current facility-administered medications on file prior to visit.    No Known Allergies  Social History:  reports that she has never smoked. She has never used smokeless tobacco. She reports current alcohol use. She reports that she does not use drugs.  Family History  Problem Relation Age of Onset  . Diabetes Mother   . Colon cancer Neg Hx   . Esophageal cancer Neg Hx   . Rectal cancer Neg Hx   . Stomach cancer Neg Hx     The following portions of the patient's history were reviewed and updated as appropriate: allergies, current medications, past family history, past medical history, past social history,  past surgical history and problem list.  Review of Systems Pertinent items noted in HPI and remainder of comprehensive ROS otherwise negative.  Physical Exam:  BP (!) 113/58   Pulse (!) 59   Wt 156 lb 8 oz (71 kg)   LMP 09/30/2019 (Exact Date)   BMI 26.04 kg/m  CONSTITUTIONAL: Well-developed, well-nourished female in no acute distress.  HENT:  Normocephalic, atraumatic, External right and left ear normal. Oropharynx is clear and moist EYES: Conjunctivae and EOM are normal.   NECK: Normal range of motion, supple, no masses.  Normal thyroid.  SKIN: Skin is warm and dry. No rash noted. Not diaphoretic. No erythema. No pallor. MUSCULOSKELETAL: Normal range of motion. No tenderness.  No cyanosis, clubbing, or edema.  2+ distal pulses. NEUROLOGIC: Alert and oriented to person, place, and time. Normal reflexes, muscle tone coordination.  PSYCHIATRIC: Normal mood and affect. Normal behavior. Normal judgment and thought content. CARDIOVASCULAR: Normal heart rate noted, regular rhythm RESPIRATORY: Clear to auscultation bilaterally. Effort and breath sounds normal, no problems with respiration noted. BREASTS: Symmetric in size. No masses, tenderness, skin changes, nipple drainage, or lymphadenopathy bilaterally. Performed in the presence of a chaperone. ABDOMEN: Soft, no distention noted.  No tenderness, rebound or guarding.  PELVIC: Normal appearing external genitalia and urethral meatus; normal appearing vaginal mucosa and cervix.  No abnormal discharge noted.  Pap smear obtained.  Normal uterine size, no other palpable masses, no uterine or adnexal tenderness.  Performed in the presence of a  chaperone.   Assessment and Plan:    1. Menorrhagia with irregular cycle Will continue with lysteda for now.  Pt does not have insurance and hysterectomy or ablation might be cost prohibitive.  If lysteda becomes less effective, would consider IUd or ablation as next treatment. - Surgical pathology( [facility]) - tranexamic acid (LYSTEDA) 650 MG TABS tablet; Take 2 tablets (1,300 mg total) by mouth 3 (three) times daily. Take during menses for a maximum of five days  Dispense: 30 tablet; Refill: 10  2. Women's annual routine gynecological examination  - Cytology - PAP - Surgical pathology( [place]/ [facility]) - Flu Vaccine QUAD 36+ mos IM  3. Encounter for screening mammogram for breast cancer   4. Pain following surgery or procedure  - ibuprofen (ADVIL) tablet 800 mg  Will follow up results of pap smear and manage accordingly. Mammogram scheduled Routine preventative health maintenance measures emphasized. Please refer to After Visit Summary for other counseling recommendations.     F/u in 1 year or prn Lynnda Shields, MD, [place], [facility] for [place]

## 2020-10-20 NOTE — Progress Notes (Signed)
Given ibuprofen post endometrial biopsy per order for post procdure pain =4. Was initially lightheaded when sat up after biopsy, had patient lie down , cool cloth given. After several moments she said she felt fine, sat without dizziness and assisted with standing. Water given. Dr. Elgie Congo aware.  Unable to schedule mammogram. [facility] referral made and notified patient they would contact her. Also gave her number if she does not hear from them within 2 weeks.  Joshawa Dubin,RN

## 2020-10-20 NOTE — Progress Notes (Signed)
ENDOMETRIAL BIOPSY      Natalie Aguilar is a 40 y.o. G3P3. here for endometrial biopsy.  The indications for endometrial biopsy were reviewed.  Risks of the biopsy including cramping, bleeding, infection, uterine perforation, inadequate specimen and need for additional procedures were discussed. The patient states she understands and agrees to undergo procedure today. Consent was signed. Time out was performed.   Indications: menorrhagia Urine HCG: negative  A bivalve speculum was placed into the vagina and the cervix was easily visualized and was prepped with Betadine x2. A single-toothed tenaculum was placed on the anterior lip of the cervix to stabilize it. The 3 mm pipelle was introduced into the endometrial cavity without difficulty to a depth of 8 cm, and a moderate amount of tissue was obtained and sent to pathology. This was repeated for a total of 2 passes. The instruments were removed from the patient's vagina. Minimal bleeding from the cervix at the tenaculum was noted.   The patient tolerated the procedure well. Routine post-procedure instructions were given to the patient.    Will base further management on results of biopsy.  Lynnda Shields, MD Faculty Attending, [facility] for [facility]

## 2020-10-20 NOTE — Patient Instructions (Addendum)
Biopsia de endometrio Endometrial Biopsy  La biopsia de endometrio es un procedimiento que se realiza para extraer muestras de tejido del endometrio, que es el revestimiento del tero. El tejido que se extrae puede ser revisado con microscopio para determinar si hay alguna enfermedad. Este procedimiento se [place] para Engineer, maintenance (IT) de endometrio, la tuberculosis endometrial, los plipos u otras afecciones inflamatorias. Este procedimiento tambin puede usarse para investigar los sangrados uterinos a fin de Teacher, adult education en qu etapa del ciclo menstrual est o de qu modo los niveles de hormonas afectan el revestimiento del tero. Informe al mdico acerca de lo siguiente:  Cualquier alergia que tenga.  Todos los [employer] [place], incluidos vitaminas, hierbas, gotas oftlmicas, cremas y medicamentos de venta libre.  Problemas previos que usted o algn miembro de su familia hayan tenido con los anestsicos.  Cualquier trastorno de la sangre que tenga.  Cirugas a las que se haya sometido.  Cualquier afeccin mdica que tenga.  Si est embarazada o podra estarlo. Cules son los riesgos? En general, se trata de un procedimiento seguro. Sin embargo, pueden ocurrir complicaciones, por ejemplo:  Sangrado.  Infecciones plvicas.  Puncin en la pared del tero con el dispositivo utilizado para tomar la biopsia (raro).  Reacciones alrgicas a los medicamentos. Qu ocurre antes del procedimiento?  Lleve un registro de sus ciclos menstruales segn se lo haya indicado su mdico. Puede ser necesario que programe el procedimiento para un momento especfico del ciclo menstrual.  Puede llevar un apsito sanitario para usar despus del procedimiento.  Haga que alguien la lleve a su casa desde el hospital o la clnica.  Consulte al mdico sobre: ? Quarry manager o suspender los medicamentos que [place] habitualmente. Esto es muy importante si toma medicamentos para la diabetes,  para la artritis o anticoagulantes. ? Tomar medicamentos como aspirina e ibuprofeno. Estos medicamentos pueden tener un efecto anticoagulante en la [place]. No tome estos medicamentos a menos que el mdico se lo indique. ? Usar medicamentos de venta libre, vitaminas, hierbas y suplementos. Qu ocurre durante el procedimiento?  Deber recostarse en una camilla con los pies y las piernas elevados, como en el examen plvico.  El mdico insertar un instrumento (espculo) en la vagina para observar el cuello uterino.  El cuello uterino ser desinfectado con una solucin antisptica.  Para adormecer el cuello uterino, le aplicarn un medicamento (anestsico local).  Se utilizar un frceps (tenculo) para mantener el cuello uterino firme.  Se insertar un instrumento delgado, similar a una varilla (sonda uterina) a travs del cuello uterino para Teacher, adult education su longitud y TEFL teacher de donde se tomar la muestra para la biopsia.  A continuacin, se pasa un tubo delgado y flexible (catter) a travs del cuello uterino [employer]. El catter se Risk manager para Barista de tejido del endometrio para la biopsia.  Se retirarn el catter y [place], y la [place] se enviar al laboratorio para ser examinada. El procedimiento puede variar segn el mdico y el hospital. Qu puedo esperar despus del procedimiento?  Descansar en un rea de recuperacin hasta que est lista para volver a su casa.  Sentir clicos leves y tendr una pequea cantidad de sangrado vaginal. Esto es normal.  Puede tener una pequea cantidad de sangrado vaginal durante unos das. Esto es normal.  Es su responsabilidad retirar los [place] del procedimiento. Pregntele a su mdico, o a un miembro del personal del departamento donde se realice el procedimiento, cundo estarn [employer]. Siga estas  instrucciones en su casa:  Use los medicamentos de venta libre y los recetados solamente como se  lo haya indicado el mdico.  No utilice tampones, duchas vaginales ni tenga relaciones sexuales hasta que el mdico lo autorice.  Retome sus actividades normales segn lo indicado por el mdico. Pregntele al mdico qu actividades son seguras para usted.  Siga las indicaciones del mdico relacionadas con la restriccin a ciertas actividades, como las restricciones para Optometrist ejercicios fsicos extenuantes o levantar objetos pesados.  [place]um[place]a con todas las visitas de seguimiento. Esto es importante. Comunquese con un mdico:  Tiene un sangrado abundante o sangra durante ms de 2das despus del procedimiento.  Tiene secrecin vaginal con mal olor.  Tiene fiebre o escalofros.  Tiene una sensacin de ardor al [place] o tiene dificultad para Garment/textile technologist.  Siente un dolor intenso en la regin inferior del abdomen. Solicite ayuda de inmediato si:  Siente clicos intensos en el estmago o en la espalda.  Elimina cogulos grandes.  La hemorragia aumenta.  Se siente dbil o mareada, o se desmaya o pierde la conciencia. Resumen  La biopsia de endometrio es un procedimiento que se realiza para extraer muestras de tejido del endometrio, que es el revestimiento del tero.  La muestra de tejido que se extrae ser Carolyne Fiscal con microscopio para determinar si hay alguna enfermedad.  Este procedimiento se [place] para Engineer, maintenance (IT) de endometrio, la tuberculosis endometrial, los plipos u otras afecciones inflamatorias.  Despus del procedimiento, es normal tener clicos leves y tendr [place] pequea cantidad de sangrado vaginal durante [employer].  No utilice tampones, duchas vaginales ni tenga relaciones sexuales hasta que el mdico lo autorice. Pregntele al mdico qu actividades son seguras para usted. Esta informacin no tiene Marine scientist el consejo del mdico. Asegrese de hacerle al mdico cualquier pregunta que tenga. Document Revised: 06/04/2020 Document  Reviewed: 06/04/2020 Elsevier Patient Education  [place]. ENDOMETRIAL BIOPSY POST-PROCEDURE INSTRUCTIONS  1. You may take Ibuprofen, Aleve or Tylenol for pain if needed.  Cramping should resolve within in 24 hours.  2. You may have a small amount of spotting.  You should wear a mini pad for the next few days.  3. You may have intercourse after 24 hours.  4. You need to call if you have any pelvic pain, fever, heavy bleeding or foul smelling vaginal discharge.  5. Shower or bathe as normal  6. We will call you within one week with results or we will discuss   the results at your follow-up appointment if needed.     For Mammogram - you should hear from [facility]  At [phone]

## 2020-10-22 LAB — SURGICAL PATHOLOGY

## 2020-10-23 ENCOUNTER — Telehealth: Payer: Self-pay

## 2020-10-23 NOTE — Telephone Encounter (Addendum)
-----   Message from Griffin Basil, MD sent at 10/23/2020  8:37 AM EST ----- Endometrial biopsy was normal, no malignancy or hyperplasia, pt will be informed due to spanish speaking  Called pt with Spanish Interpreter Eda R. and informed pt of endo bx results which are normal.  Pt asked what her pap smear results were.  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  I also informed pt that she would need a procedure called a colposcopy.  I explained to the pt what to expect from the procedure.  I verified with pt if she has insurance.  Pt informed me that she did not have insurance but she does have [place] assistance and orange card.  I informed pt that I would reach out to [facility] to see if she qualifies.  I advised pt that either the [facility] program or our office will call you with an appt and if [facility] calls you then they will have to see you first and then your appt will be scheduled with the office.  Pt verbalized understanding.    Mel Almond, RN  10/29/20

## 2020-10-24 LAB — CYTOLOGY - PAP
Comment: NEGATIVE
High risk HPV: POSITIVE — AB

## 2020-11-05 ENCOUNTER — Other Ambulatory Visit: Payer: Self-pay | Admitting: Obstetrics and Gynecology

## 2020-11-05 DIAGNOSIS — Z1231 Encounter for screening mammogram for malignant neoplasm of breast: Secondary | ICD-10-CM

## 2020-11-11 ENCOUNTER — Other Ambulatory Visit: Payer: Self-pay

## 2020-11-11 ENCOUNTER — Encounter (HOSPITAL_COMMUNITY): Payer: Self-pay

## 2020-11-11 ENCOUNTER — Other Ambulatory Visit (HOSPITAL_COMMUNITY): Payer: Self-pay | Admitting: Urgent Care

## 2020-11-11 ENCOUNTER — Ambulatory Visit (HOSPITAL_COMMUNITY)
Admission: EM | Admit: 2020-11-11 | Discharge: 2020-11-11 | Disposition: A | Discharge status: Home / Self Care | Payer: Self-pay | Attending: Emergency Medicine | Admitting: Emergency Medicine

## 2020-11-11 DIAGNOSIS — Z8616 Personal history of COVID-19: Secondary | ICD-10-CM

## 2020-11-11 DIAGNOSIS — J9801 Acute bronchospasm: Secondary | ICD-10-CM

## 2020-11-11 DIAGNOSIS — R059 Cough, unspecified: Secondary | ICD-10-CM

## 2020-11-11 MED ORDER — PREDNISONE 20 MG PO TABS: ORAL_TABLET | ORAL | 0 refills | Status: DC

## 2020-11-11 MED ORDER — PROMETHAZINE-DM 6.25-15 MG/5ML PO SYRP: 5.0000 mL | ORAL_SOLUTION | Freq: Every evening | ORAL | 0 refills | Status: DC | PRN

## 2020-11-11 MED ORDER — CETIRIZINE HCL 10 MG PO TABS: 10.0000 mg | ORAL_TABLET | Freq: Every day | ORAL | 0 refills | Status: DC

## 2020-11-11 MED ORDER — BENZONATATE 100 MG PO CAPS: 100.0000 mg | ORAL_CAPSULE | Freq: Three times a day (TID) | ORAL | 0 refills | Status: DC | PRN

## 2020-11-11 NOTE — ED Provider Notes (Signed)
[place]   MRN: [id number] DOB: [birth year]  Subjective:   Natalie Aguilar is a 40 y.o. female presenting for 3-week history of persistent cough that elicits intermittent mild chest pain. Patient tested positive for COVID-19 and overall feels better except for the cough. Keeps her up at night. Denies fever, sore throat, shortness of breath. She is not a smoker, no history of lung disorders.  No current facility-administered medications for this encounter.  Current Outpatient Medications:  .  ferrous sulfate 325 (65 FE) MG EC tablet, Take 325 mg by mouth 3 (three) times daily with meals., Disp: , Rfl:  .  polyethylene glycol (MIRALAX / GLYCOLAX) 17 g packet, Take 17 g by mouth daily., Disp: , Rfl:  .  terconazole (TERAZOL 3) 0.8 % vaginal cream, Place 1 applicator vaginally at bedtime. Apply nightly for three nights., Disp: 20 g, Rfl: 0 .  tranexamic acid (LYSTEDA) 650 MG TABS tablet, Take 2 tablets (1,300 mg total) by mouth 3 (three) times daily. Take during menses for a maximum of five days, Disp: 30 tablet, Rfl: 10   No Known Allergies  History reviewed. No pertinent past medical history.   Past Surgical History:  Procedure Laterality Date  . COLONOSCOPY  07/22/2020    Family History  Problem Relation Age of Onset  . Diabetes Mother   . Colon cancer Neg Hx   . Esophageal cancer Neg Hx   . Rectal cancer Neg Hx   . Stomach cancer Neg Hx     Social History   Tobacco Use  . Smoking status: Never Smoker  . Smokeless tobacco: Never Used  Vaping Use  . Vaping Use: Never used  Substance Use Topics  . Alcohol use: Yes    Comment: occasionally   . Drug use: No    ROS   Objective:   Vitals: BP 137/68 (BP Location: Right Arm)   Pulse 72   Temp 98.5 F (36.9 C) (Oral)   LMP 11/03/2020 (Approximate)   SpO2 100%   Physical Exam Constitutional:      General: She is not in acute distress.    Appearance: Normal appearance. She is well-developed.  She is not ill-appearing, toxic-appearing or diaphoretic.  HENT:     Head: Normocephalic and atraumatic.     Nose: Nose normal.     Mouth/Throat:     Mouth: Mucous membranes are moist.  Eyes:     Extraocular Movements: Extraocular movements intact.     Pupils: Pupils are equal, round, and reactive to light.  Cardiovascular:     Rate and Rhythm: Normal rate and regular rhythm.     Pulses: Normal pulses.     Heart sounds: Normal heart sounds. No murmur heard. No friction rub. No gallop.   Pulmonary:     Effort: Pulmonary effort is normal. No respiratory distress.     Breath sounds: Normal breath sounds. No stridor. No wheezing, rhonchi or rales.  Skin:    General: Skin is warm and dry.     Findings: No rash.  Neurological:     Mental Status: She is alert and oriented to person, place, and time.  Psychiatric:        Mood and Affect: Mood normal.        Behavior: Behavior normal.        Thought Content: Thought content normal.       Assessment and Plan :   PDMP not reviewed this encounter.  1. Cough  2. Bronchospasm   3. History of COVID-19     Clear cardiopulmonary exam. Will use a trial of prednisone to help help her chest congestion, cough. Recommend supportive care otherwise. Counseled patient on potential for adverse effects with medications prescribed/recommended today, ER and return-to-clinic precautions discussed, patient verbalized understanding.    Jaynee Eagles, [place] 11/12/20 [phone]

## 2020-11-11 NOTE — ED Triage Notes (Signed)
Pt c/o a cough and chest congestion x 2 weeks. Pt states she often feels tired. Pt states she was taking DayQuil and states it did not relieve her sxs.

## 2020-11-11 NOTE — Discharge Instructions (Addendum)
Para el dolor de garganta o tos puede usar un t de miel. Use 3 cucharaditas de miel con jugo exprimido de [employer]. Coloque trozos de Pension scheme manager en 1/2-1 taza de agua y caliente sobre la estufa. Luego mezcle los ingredientes y repita cada 4 horas. Para fiebre, dolores de cuerpo tome ibuprofeno 400mg -600mg  con comida cada 6 horas alternando con o junto con Tylenol 500mg -650mg  cada 6 horas. Hidrata muy bien con al menos 2 litros (64 onzas) de agua al dia. Coma comidas ligeras como sopas para [employer] y nutricion. Tambien puede tomar suero. Comience un antihistamnico como Zyrtec (cetirizina) 10mg  al dia.  Use el jarabe por la noche para su tos y las capsulas durante el dia.

## 2020-11-14 ENCOUNTER — Telehealth: Payer: Self-pay | Admitting: Nurse Practitioner

## 2020-11-14 NOTE — Telephone Encounter (Signed)
Copied from [facility] [phone]. Topic: Appointment Scheduling - Scheduling Inquiry for Clinic >> Nov 11, 2020 12:15 PM Tessa Lerner A wrote: Reason for CRM: Patient would like to be seen in office for concerns related to a cough Per decision tree, agent was unable to schedule an office visit at the time of call Please contact patient to advise scheduling  Called patient using interpreter services and LVM advising her to call [phone] to schedule a virtual appointment due to her symptoms.

## 2020-11-17 ENCOUNTER — Ambulatory Visit (HOSPITAL_COMMUNITY)
Admission: EM | Admit: 2020-11-17 | Discharge: 2020-11-17 | Disposition: A | Discharge status: Home / Self Care | Payer: Self-pay | Attending: Emergency Medicine | Admitting: Emergency Medicine

## 2020-11-17 ENCOUNTER — Telehealth: Payer: Self-pay | Admitting: Nurse Practitioner

## 2020-11-17 ENCOUNTER — Encounter (HOSPITAL_COMMUNITY): Payer: Self-pay

## 2020-11-17 ENCOUNTER — Other Ambulatory Visit: Payer: Self-pay | Admitting: Adult Health

## 2020-11-17 DIAGNOSIS — B379 Candidiasis, unspecified: Secondary | ICD-10-CM

## 2020-11-17 DIAGNOSIS — B373 Candidiasis of vulva and vagina: Secondary | ICD-10-CM | POA: Insufficient documentation

## 2020-11-17 LAB — POCT URINALYSIS DIPSTICK, ED / UC
Bilirubin Urine: NEGATIVE
Glucose, UA: NEGATIVE mg/dL
Ketones, ur: NEGATIVE mg/dL
Nitrite: NEGATIVE
Protein, ur: NEGATIVE mg/dL
Specific Gravity, Urine: >=1.03 (ref 1.005–1.030)
Urobilinogen, UA: 0.2 mg/dL (ref 0.0–1.0)
pH: 5.5 (ref 5.0–8.0)

## 2020-11-17 MED ORDER — FLUCONAZOLE 150 MG PO TABS: 150.0000 mg | ORAL_TABLET | Freq: Once | ORAL | 0 refills | Status: AC

## 2020-11-17 NOTE — Discharge Instructions (Signed)
Take one pill, if symptoms are still present after 3 days, take the second pill   Follow up with PCP or gynecologist if symptoms continue after taking all medication

## 2020-11-17 NOTE — Telephone Encounter (Signed)
Copied from [facility] [phone]. Topic: Appointment Scheduling - Scheduling Inquiry for Clinic >> Nov 14, 2020 10:02 AM Tessa Lerner A wrote: Reason for CRM: Patient would like to be seen virtually to discuss skin irritation Patient is requesting to be contacted if scheduling an earlier appointment is possible The soonest available appointment at the time of call was for 12/31/20   Called patient and LVM advising her to call [phone] to schedule a virtual appt for skin irritation.

## 2020-11-17 NOTE — ED Provider Notes (Signed)
Natalie Aguilar    CSN: [id number] Arrival date & time: 11/17/20  1111      History   Chief Complaint Chief Complaint  Patient presents with  . UTI  . Dysuria    HPI Natalie Aguilar is a 40 y.o. female.   Patient presents with Pallas Wahlert thick discharge with clumps and vaginal irritation starting 6 days ago. C/o of pain with urination beginning Friday. Denies odor, blood in dischrage or urine, frequency, urgency, fever, chills and body aches. Denies possibility of pregnancy. LMP 1/19. No known sti contact. Has history of chronic vaginitis. Last event 08/29/20   Spanish video interpreter used during duration of exam   History reviewed. No pertinent past medical history.  Patient Active Problem List   Diagnosis Date Noted  . Women's annual routine gynecological examination 10/20/2020  . Menorrhagia with irregular cycle 08/29/2020  . Candidiasis of vulva and vagina 08/29/2020    Past Surgical History:  Procedure Laterality Date  . COLONOSCOPY  07/22/2020    OB History   No obstetric history on file.      Home Medications    Prior to Admission medications   Medication Sig Start Date End Date Taking? Authorizing Provider  fluconazole (DIFLUCAN) 150 MG tablet Take 1 tablet (150 mg total) by mouth once for 1 dose. May take second tablet if symptoms persist after 72 hours 11/17/20 11/17/20 Yes Jahrel Borthwick R, NP  benzonatate (TESSALON) 100 MG capsule Take 1-2 capsules (100-200 mg total) by mouth 3 (three) times daily as needed for cough. 11/11/20   Jaynee Eagles, PA-C  cetirizine (ZYRTEC ALLERGY) 10 MG tablet Take 1 tablet (10 mg total) by mouth daily. 11/11/20   Jaynee Eagles, PA-C  ferrous sulfate 325 (65 FE) MG EC tablet Take 325 mg by mouth 3 (three) times daily with meals.    [provider]  polyethylene glycol (MIRALAX / GLYCOLAX) 17 g packet Take 17 g by mouth daily.    [provider]  predniSONE (DELTASONE) 20 MG tablet Take 2 tablets daily with  breakfast. 11/11/20   Jaynee Eagles, PA-C  promethazine-dextromethorphan (PROMETHAZINE-DM) 6.25-15 MG/5ML syrup Take 5 mLs by mouth at bedtime as needed for cough. 11/11/20   Jaynee Eagles, PA-C  terconazole (TERAZOL 3) 0.8 % vaginal cream Place 1 applicator vaginally at bedtime. Apply nightly for three nights. 08/29/20   Griffin Basil, MD  tranexamic acid (LYSTEDA) 650 MG TABS tablet Take 2 tablets (1,300 mg total) by mouth 3 (three) times daily. Take during menses for a maximum of five days 10/20/20   Griffin Basil, MD    Family History Family History  Problem Relation Age of Onset  . Diabetes Mother   . Colon cancer Neg Hx   . Esophageal cancer Neg Hx   . Rectal cancer Neg Hx   . Stomach cancer Neg Hx     Social History Social History   Tobacco Use  . Smoking status: Never Smoker  . Smokeless tobacco: Never Used  Vaping Use  . Vaping Use: Never used  Substance Use Topics  . Alcohol use: Yes    Comment: occasionally   . Drug use: No     Allergies   Patient has no known allergies.   Review of Systems Review of Systems  Constitutional: Negative.   Respiratory: Negative.   Cardiovascular: Negative.   Genitourinary: Positive for dysuria and vaginal discharge. Negative for decreased urine volume, difficulty urinating, dyspareunia, enuresis, flank pain, frequency, genital sores, hematuria, menstrual problem,  pelvic pain, urgency, vaginal bleeding and vaginal pain.  Neurological: Negative.   Psychiatric/Behavioral: Negative.      Physical Exam Triage Vital Signs ED Triage Vitals  Enc Vitals Group     BP 11/17/20 1307 (!) 110/57     Pulse Rate 11/17/20 1307 60     Resp 11/17/20 1307 17     Temp 11/17/20 1307 98 F (36.7 C)     Temp Source 11/17/20 1307 Oral     SpO2 11/17/20 1307 100 %     Weight --      Height --      Head Circumference --      Peak Flow --      Pain Score 11/17/20 1305 0     Pain Loc --      Pain Edu? --      Excl. in [place]? --    No data  found.  Updated Vital Signs BP (!) 110/57 (BP Location: Left Arm)   Pulse 60   Temp 98 F (36.7 C) (Oral)   Resp 17   LMP 11/03/2020 (Approximate)   SpO2 100%   Visual Acuity Right Eye Distance:   Left Eye Distance:   Bilateral Distance:    Right Eye Near:   Left Eye Near:    Bilateral Near:     Physical Exam Constitutional:      Appearance: Normal appearance. She is normal weight.  HENT:     Head: Normocephalic.  Eyes:     Extraocular Movements: Extraocular movements intact.  Cardiovascular:     Rate and Rhythm: Normal rate and regular rhythm.     Pulses: Normal pulses.     Heart sounds: Normal heart sounds.  Pulmonary:     Effort: Pulmonary effort is normal.     Breath sounds: Normal breath sounds.  Abdominal:     General: Abdomen is flat. Bowel sounds are normal.     Palpations: Abdomen is soft.     Tenderness: There is no abdominal tenderness. There is no right CVA tenderness, left CVA tenderness or guarding.  Genitourinary:    Comments: Vaginal exam deferred Musculoskeletal:        General: Normal range of motion.     Cervical back: Normal range of motion.  Neurological:     General: No focal deficit present.     Mental Status: She is alert and oriented to person, place, and time. Mental status is at baseline.  Psychiatric:        Mood and Affect: Mood normal.        Behavior: Behavior normal.        Thought Content: Thought content normal.        Judgment: Judgment normal.      UC Treatments / Results  Labs (all labs ordered are listed, but only abnormal results are displayed) Labs Reviewed  POCT URINALYSIS DIPSTICK, ED / UC - Abnormal; Notable for the following components:      Result Value   Hgb urine dipstick LARGE (*)    Leukocytes,Ua SMALL (*)    All other components within normal limits    EKG   Radiology No results found.  Procedures Procedures (including critical care time)  Medications Ordered in UC Medications - No data to  display  Initial Impression / Assessment and Plan / UC Course  I have reviewed the triage vital signs and the nursing notes.  Pertinent labs & imaging results that were available during my care of the patient were  reviewed by me and considered in my medical decision making (see chart for details).  Candida Infection  Due to frequency of vaginitis caused by yeast. Patient treated today, results pending, will change course of action if needed.   1. Urinalysis- negative 2. Routine sti testing- aptima swab self collect- results pending 3. Diflucan 150 mg once, 2nd dose as needed 4. Follow up with gynecology if symptoms persists after course of treatment  Final Clinical Impressions(s) / UC Diagnoses   Final diagnoses:  Candida infection     Discharge Instructions     Take one pill, if symptoms are still present after 3 days, take the second pill   Follow up with PCP or gynecologist if symptoms continue after taking all medication    ED Prescriptions    Medication Sig Dispense Auth. Provider   fluconazole (DIFLUCAN) 150 MG tablet Take 1 tablet (150 mg total) by mouth once for 1 dose. May take second tablet if symptoms persist after 72 hours 2 tablet D'Arcy Abraha, Leitha Schuller, NP     PDMP not reviewed this encounter.   Hans Eden, [place] 11/17/20 [phone]

## 2020-11-17 NOTE — ED Triage Notes (Signed)
Pt presents with white vaginal discharge x 4 days ago. She states she ha been having the urge to urinate and painful urination. She denies abdominal pain. She states the discharge is white and states she notices small clots.

## 2020-11-18 LAB — CERVICOVAGINAL ANCILLARY ONLY
Bacterial Vaginitis (gardnerella): NEGATIVE
Candida Glabrata: NEGATIVE
Candida Vaginitis: POSITIVE — AB
Chlamydia: NEGATIVE
Comment: NEGATIVE
Comment: NEGATIVE
Comment: NEGATIVE
Comment: NEGATIVE
Comment: NEGATIVE
Comment: NORMAL
Neisseria Gonorrhea: NEGATIVE
Trichomonas: NEGATIVE

## 2020-12-09 ENCOUNTER — Ambulatory Visit: Payer: No Typology Code available for payment source | Admitting: *Deleted

## 2020-12-09 ENCOUNTER — Other Ambulatory Visit: Payer: Self-pay

## 2020-12-09 ENCOUNTER — Ambulatory Visit
Admission: RE | Admit: 2020-12-09 | Discharge: 2020-12-09 | Disposition: A | Discharge status: Home / Self Care | Payer: No Typology Code available for payment source | Source: Ambulatory Visit | Attending: Obstetrics and Gynecology | Admitting: Obstetrics and Gynecology

## 2020-12-09 VITALS — BP 122/76 | Wt 156.2 lb

## 2020-12-09 DIAGNOSIS — Z1239 Encounter for other screening for malignant neoplasm of breast: Secondary | ICD-10-CM

## 2020-12-09 DIAGNOSIS — R87612 Low grade squamous intraepithelial lesion on cytologic smear of cervix (LGSIL): Secondary | ICD-10-CM

## 2020-12-09 DIAGNOSIS — Z1231 Encounter for screening mammogram for malignant neoplasm of breast: Secondary | ICD-10-CM

## 2020-12-09 NOTE — Progress Notes (Signed)
Natalie Aguilar is a 40 y.o. female who presents to [facility] clinic today with complaint of bilateral diffuse breast pain. Patient states the pain previously only before her period but now lasts longer. Patient rates the pain at a 7 out of 10.   Patient referred to [facility] by the [facility] for [place] due to having an abnormal Pap smear on 10/20/2020 that a colposcopy is recommended for follow-up.   Pap Smear: Pap smear not completed today. Last Pap smear was 10/20/2020 at [facility] [place] [facility] and was LSIL with positive HPV. Per patient has history of an abnormal Pap smear over 10 years ago that a colposcopy was completed for follow-up. Patient stated she has had at least three normal Pap smears since colposcopy. Last Pap smear result is available in Epic.   Physical exam: Breasts Breasts symmetrical. No skin abnormalities bilateral breasts. No nipple retraction bilateral breasts. No nipple discharge bilateral breasts. No lymphadenopathy. No lumps palpated bilateral breasts. Complaints of bilateral diffuse breast pain on exam.       Pelvic/Bimanual Pap is not indicated today per BCCCP guidelines.    Smoking History: Patient has never smoked.   Patient Navigation: Patient education provided. Access to services provided for patient through [place] program. Spanish interpreter [name] from [facility] provided.    Breast and Cervical Cancer Risk Assessment: Patient does not have family history of breast cancer, known genetic mutations, or radiation treatment to the chest before age 48. Patient does not have history of cervical dysplasia, immunocompromised, or DES exposure in-utero.  Risk Assessment    Risk Scores      12/09/2020   Last edited by: Demetrius Revel, LPN   5-year risk: 0.3 %   Lifetime risk: 5.8 %          A: BCCCP exam without pap smear Complaint of bilateral diffuse breast pain.  P: Referred patient to the [facility] for a screening mammogram on the mobile unit. Appointment scheduled Tuesday, December 09, 2020 at 1030.  Referred patient to the [facility] for [place] for a colposcopy to follow-up for her abnormal Pap smear. The Center for [place] will call patient with appointment. Referral has been sent.  Loletta Parish, RN 12/09/2020 10:07 AM

## 2020-12-09 NOTE — Patient Instructions (Signed)
Explained breast self awareness with Donney Dice. Patient did not need a Pap smear today due to last Pap smear was 10/20/2020. Explained the colposcopy the recommended follow-up for her abnormal Pap smear.  Referred patient to the [facility] for [place] for a colposcopy to follow-up for her abnormal Pap smear. The Center for [place] will call patient with appointment. Referred patient to the [facility] for a screening mammogram on the mobile unit. Appointment scheduled Tuesday, December 09, 2020 at 1030. Patient escorted to the mobile unit following BCCCP appointment for her screening mammogram.  Let patient know the [facility] Center will follow up with her within the next couple weeks with results of her mammogram by letter or phone. [place] verbalized understanding.  Lema Heinkel, [place]vil [place], RN 10:07 AM

## 2020-12-22 ENCOUNTER — Other Ambulatory Visit: Payer: Self-pay

## 2020-12-22 ENCOUNTER — Ambulatory Visit: Payer: No Typology Code available for payment source | Attending: Nurse Practitioner

## 2020-12-24 ENCOUNTER — Ambulatory Visit (INDEPENDENT_AMBULATORY_CARE_PROVIDER_SITE_OTHER): Payer: Self-pay | Admitting: *Deleted

## 2020-12-24 ENCOUNTER — Other Ambulatory Visit: Payer: Self-pay

## 2020-12-24 ENCOUNTER — Encounter: Payer: Self-pay | Admitting: *Deleted

## 2020-12-24 ENCOUNTER — Other Ambulatory Visit: Payer: Self-pay | Admitting: Family Medicine

## 2020-12-24 ENCOUNTER — Other Ambulatory Visit (HOSPITAL_COMMUNITY)
Admission: RE | Admit: 2020-12-24 | Discharge: 2020-12-24 | Disposition: A | Discharge status: Home / Self Care | Payer: No Typology Code available for payment source | Source: Ambulatory Visit | Attending: Family Medicine | Admitting: Family Medicine

## 2020-12-24 VITALS — BP 128/65 | HR 61 | Ht 65.0 in | Wt 158.3 lb

## 2020-12-24 DIAGNOSIS — Z8619 Personal history of other infectious and parasitic diseases: Secondary | ICD-10-CM

## 2020-12-24 DIAGNOSIS — R300 Dysuria: Secondary | ICD-10-CM | POA: Insufficient documentation

## 2020-12-24 DIAGNOSIS — N898 Other specified noninflammatory disorders of vagina: Secondary | ICD-10-CM

## 2020-12-24 MED ORDER — FLUCONAZOLE 150 MG PO TABS: 150.0000 mg | ORAL_TABLET | Freq: Once | ORAL | 0 refills | Status: DC

## 2020-12-24 NOTE — Progress Notes (Signed)
[name] present for encounter. Pt presents with complaints of vaginal itching, white discharge, irritation and dysuria. She further stated that this problem has been occurring off and on for several months. She was seen in January for these issues @ [facility] due to she could not get appt in this office per her report. At that time she was treated for UTI and vaginal yeast. On 3/3, she was seen again @ [facility] for the same Sx and was told she did not have UTI but was given Rx for Fluconazole again. Pt states that her symptoms go away after taking the medication and then return soon after. Pt reports history of +Chlamydia one year ago. Urinalysis was performed today and was negative for infection. Self swab was obtained and will be sent to lab for wet prep/GC/CT testing.  Due to pt's persistent sx, Fluconazole was e-prescribed per standing order. Pt was instructed to discuss this problem with provider during scheduled visit on 3/22 for [place]. She voiced understanding of all information and instructions given.

## 2020-12-25 LAB — POCT URINALYSIS DIP (DEVICE)
Bilirubin Urine: NEGATIVE
Glucose, UA: NEGATIVE mg/dL
Hgb urine dipstick: NEGATIVE
Ketones, ur: NEGATIVE mg/dL
Leukocytes,Ua: NEGATIVE
Nitrite: NEGATIVE
Protein, ur: NEGATIVE mg/dL
Specific Gravity, Urine: >=1.03 (ref 1.005–1.030)
Urobilinogen, UA: 0.2 mg/dL (ref 0.0–1.0)
pH: 5.5 (ref 5.0–8.0)

## 2020-12-25 LAB — CERVICOVAGINAL ANCILLARY ONLY
Bacterial Vaginitis (gardnerella): NEGATIVE
Candida Glabrata: NEGATIVE
Candida Vaginitis: NEGATIVE
Chlamydia: NEGATIVE
Comment: NEGATIVE
Comment: NEGATIVE
Comment: NEGATIVE
Comment: NEGATIVE
Comment: NEGATIVE
Comment: NORMAL
Neisseria Gonorrhea: NEGATIVE
Trichomonas: NEGATIVE

## 2020-12-25 NOTE — Progress Notes (Signed)
Chart reviewed for nurse visit. Agree with plan of care.   Clarnce Flock, MD 12/25/20 8:06 AM

## 2020-12-30 ENCOUNTER — Ambulatory Visit (INDEPENDENT_AMBULATORY_CARE_PROVIDER_SITE_OTHER): Payer: Self-pay | Admitting: Family Medicine

## 2020-12-30 ENCOUNTER — Other Ambulatory Visit: Payer: Self-pay

## 2020-12-30 ENCOUNTER — Other Ambulatory Visit (HOSPITAL_COMMUNITY)
Admission: RE | Admit: 2020-12-30 | Discharge: 2020-12-30 | Disposition: A | Discharge status: Home / Self Care | Payer: No Typology Code available for payment source | Source: Ambulatory Visit | Attending: Family Medicine | Admitting: Family Medicine

## 2020-12-30 VITALS — BP 100/69 | HR 62 | Ht 65.0 in | Wt 149.0 lb

## 2020-12-30 DIAGNOSIS — R87612 Low grade squamous intraepithelial lesion on cytologic smear of cervix (LGSIL): Secondary | ICD-10-CM | POA: Insufficient documentation

## 2020-12-30 DIAGNOSIS — Z3202 Encounter for pregnancy test, result negative: Secondary | ICD-10-CM

## 2020-12-30 LAB — POCT PREGNANCY, URINE: Preg Test, Ur: NEGATIVE

## 2020-12-30 NOTE — Progress Notes (Addendum)
    GYNECOLOGY CLINIC COLPOSCOPY PROCEDURE NOTE  40 y.o. No obstetric history on file. here for colposcopy for  low-grade squamous intraepithelial neoplasia (LGSIL - encompassing HPV,mild dysplasia,CIN I) pap smear on 10/20/2020. Discussed role for HPV in cervical dysplasia, need for surveillance, nature of the procedure, and risks and benefits.  Patient given informed consent, signed copy in the chart, time out was performed.    Lab Results  Component Value Date   PREGTESTUR NEGATIVE 12/30/2020    No Known Allergies  Placed in lithotomy position. Cervix viewed with speculum and colposcope after application of acetic acid. Cervix noted to have appearance c/w prior cone procedure. Patient initially unsure if she had had any prior procedures, then after much thought reported she had a cone ablation 16 years ago.  Colposcopy Adequacy Cervix fully visualized: Yes  SCJ fully visualized: No extensive TZ seen but SCJ not visible  Colposcopy Findings no mosaicism, no punctation, no abnormal vasculature and visible lesion(s) at 12, 6, 9 o'clock  Corresponding were biopsies obtained.    ECC specimen was obtained.  All specimens were labeled and sent to pathology.  Hemostatic measures: Monsel's solution  Complications: none  Patient tolerated the procedure well.  OBGyn Exam  Colposcopy Impressions Low grade features  Plan Treatment plan pending biopsy results, per patient preference they will be communicated by telephone.  Patient was given post procedure instructions.  Will follow up pathology and manage accordingly; patient will be contacted with results and recommendations.  Routine preventative health maintenance measures emphasized.  Clarnce Flock, MD/MPH Attending Family Medicine Physician, [facility], [place]

## 2021-01-01 LAB — SURGICAL PATHOLOGY

## 2021-01-10 ENCOUNTER — Other Ambulatory Visit: Payer: Self-pay

## 2021-03-03 ENCOUNTER — Other Ambulatory Visit: Payer: Self-pay

## 2021-03-03 MED FILL — Tranexamic Acid Tab 650 MG: ORAL | 30 days supply | Qty: 30 | Fill #0 | Status: CN

## 2021-03-04 ENCOUNTER — Other Ambulatory Visit: Payer: Self-pay

## 2021-03-11 ENCOUNTER — Other Ambulatory Visit: Payer: Self-pay

## 2021-03-20 ENCOUNTER — Other Ambulatory Visit (HOSPITAL_COMMUNITY)
Admission: RE | Admit: 2021-03-20 | Discharge: 2021-03-20 | Disposition: A | Discharge status: Home / Self Care | Payer: No Typology Code available for payment source | Source: Ambulatory Visit | Attending: Family Medicine | Admitting: Family Medicine

## 2021-03-20 ENCOUNTER — Other Ambulatory Visit: Payer: Self-pay

## 2021-03-20 ENCOUNTER — Ambulatory Visit (INDEPENDENT_AMBULATORY_CARE_PROVIDER_SITE_OTHER): Payer: No Typology Code available for payment source | Admitting: General Practice

## 2021-03-20 VITALS — BP 106/60 | HR 55 | Ht 65.0 in | Wt 156.0 lb

## 2021-03-20 DIAGNOSIS — N898 Other specified noninflammatory disorders of vagina: Secondary | ICD-10-CM

## 2021-03-20 DIAGNOSIS — B379 Candidiasis, unspecified: Secondary | ICD-10-CM

## 2021-03-20 DIAGNOSIS — R300 Dysuria: Secondary | ICD-10-CM

## 2021-03-20 LAB — POCT URINALYSIS DIP (DEVICE)
Bilirubin Urine: NEGATIVE
Glucose, UA: NEGATIVE mg/dL
Ketones, ur: NEGATIVE mg/dL
Nitrite: NEGATIVE
Protein, ur: NEGATIVE mg/dL
Specific Gravity, Urine: >=1.03 (ref 1.005–1.030)
Urobilinogen, UA: 0.2 mg/dL (ref 0.0–1.0)
pH: 5.5 (ref 5.0–8.0)

## 2021-03-20 MED ORDER — FLUCONAZOLE 150 MG PO TABS
150.0000 mg | ORAL_TABLET | ORAL | 2 refills | Status: DC
  Filled 2021-03-20: qty 2, 6d supply, fill #0

## 2021-03-20 MED FILL — Tranexamic Acid Tab 650 MG: ORAL | 5 days supply | Qty: 30 | Fill #0 | Status: AC

## 2021-03-20 NOTE — Progress Notes (Signed)
Patient presents to office today reporting white clumpy vaginal discharge for past several months. She reports itching internally & burning externally. Denies odor. Patient reports this starts every month 1-2 weeks after her period but goes away during her period. Patient does endorse some incomplete emptying with urination & dysuria. Will have patient collect self swab & check UA today. UA is inconclusive- will send for culture. Diflucan sent to pharmacy with refills & advised patient to use Boric Acid suppositories twice a week during non period weeks to help establish pH balance per Dr Kennon Rounds. Patient verbalized understanding.  Koren Bound RN BSN 03/20/21

## 2021-03-20 NOTE — Progress Notes (Signed)
Patient seen and assessed by nursing staff.  Agree with documentation and plan.  

## 2021-03-22 LAB — CERVICOVAGINAL ANCILLARY ONLY
Bacterial Vaginitis (gardnerella): NEGATIVE
Candida Glabrata: NEGATIVE
Candida Vaginitis: POSITIVE — AB
Comment: NEGATIVE
Comment: NEGATIVE
Comment: NEGATIVE
Comment: NEGATIVE
Trichomonas: NEGATIVE

## 2021-03-24 LAB — URINE CULTURE

## 2021-03-25 ENCOUNTER — Telehealth: Payer: Self-pay | Admitting: *Deleted

## 2021-03-25 NOTE — Telephone Encounter (Signed)
-----   Message from Donnamae Jude, MD sent at 03/24/2021  3:14 PM EDT ----- Has GBS in urine, and can treat with Amoxil 500 mg tid x 5 days, or if no more symptoms, consider recollectiuon. There is not a colony count so unclear if this is a true infection.

## 2021-03-25 NOTE — Telephone Encounter (Signed)
I called Faline with [employer] and left a message we are calling with results and information from your doctor. Please call our office. Kirston Luty,RN

## 2021-03-26 ENCOUNTER — Other Ambulatory Visit: Payer: Self-pay

## 2021-03-26 MED ORDER — AMOXICILLIN 500 MG PO CAPS
500.0000 mg | ORAL_CAPSULE | Freq: Three times a day (TID) | ORAL | 0 refills | Status: AC
  Filled 2021-03-26 – 2021-04-02 (×2): qty 15, 5d supply, fill #0

## 2021-03-26 NOTE — Telephone Encounter (Signed)
Called pt with Spanish Interpreter Eda R., and informed pt that she has a UTI and that an antibiotic has been sent to her pharmacy called Amoxicillin that she would need to take three times a day for 5 days.  Pt verbalized understanding with no further questions.   Frances Nickels  03/26/21

## 2021-04-02 ENCOUNTER — Other Ambulatory Visit: Payer: Self-pay

## 2021-05-12 ENCOUNTER — Other Ambulatory Visit (HOSPITAL_COMMUNITY)
Admission: RE | Admit: 2021-05-12 | Discharge: 2021-05-12 | Disposition: A | Discharge status: Home / Self Care | Payer: No Typology Code available for payment source | Source: Ambulatory Visit | Attending: Family Medicine | Admitting: Family Medicine

## 2021-05-12 ENCOUNTER — Ambulatory Visit (INDEPENDENT_AMBULATORY_CARE_PROVIDER_SITE_OTHER): Payer: Self-pay

## 2021-05-12 ENCOUNTER — Other Ambulatory Visit: Payer: Self-pay

## 2021-05-12 VITALS — BP 105/67 | HR 61 | Ht 65.0 in | Wt 158.2 lb

## 2021-05-12 DIAGNOSIS — R300 Dysuria: Secondary | ICD-10-CM

## 2021-05-12 DIAGNOSIS — N898 Other specified noninflammatory disorders of vagina: Secondary | ICD-10-CM

## 2021-05-12 LAB — POCT URINALYSIS DIP (DEVICE)
Bilirubin Urine: NEGATIVE
Glucose, UA: NEGATIVE mg/dL
Ketones, ur: NEGATIVE mg/dL
Leukocytes,Ua: NEGATIVE
Nitrite: NEGATIVE
Protein, ur: NEGATIVE mg/dL
Specific Gravity, Urine: >=1.03 (ref 1.005–1.030)
Urobilinogen, UA: 0.2 mg/dL (ref 0.0–1.0)
pH: 5 (ref 5.0–8.0)

## 2021-05-12 MED ORDER — FLUCONAZOLE 150 MG PO TABS
150.0000 mg | ORAL_TABLET | Freq: Once | ORAL | 0 refills | Status: AC
  Filled 2021-05-12: qty 1, 1d supply, fill #0

## 2021-05-12 MED FILL — Tranexamic Acid Tab 650 MG: ORAL | 5 days supply | Qty: 30 | Fill #1 | Status: AC

## 2021-05-12 NOTE — Progress Notes (Addendum)
Pt here today for vaginal white discharge 2 weeks with no odor, with itching and burning sensation on skin and vagina. Pt denies any STD exposure. Pt denies using any OTC treatment.  Also having urinary pain when urinating that started yesterday.  Pt will do self swab and UA in office today. Has hx of yeast infections in the past. Was given Diflucan Rx in 03/20/21. Took last pill 2 weeks ago. Pt states noticing more urinary symptoms and yeast infections occurring after intercourse.   UA today: negative in office. Will send Urine culture.   Will treat for yeast infection today per protocol, but will send self swab for possible BV. Pt agreeable and verbalized understanding. Diflucan Rx sent to pharmacy on file.   Pt also in need of annual exam. Will schedule with front office today.   Colletta [place], RN    Chart reviewed for nurse visit. Agree with plan of care.   Virginia Rochester, NP 05/12/2021 1:30 PM

## 2021-05-13 ENCOUNTER — Other Ambulatory Visit: Payer: Self-pay

## 2021-05-13 LAB — CERVICOVAGINAL ANCILLARY ONLY
Bacterial Vaginitis (gardnerella): NEGATIVE
Candida Glabrata: NEGATIVE
Candida Vaginitis: POSITIVE — AB
Chlamydia: NEGATIVE
Comment: NEGATIVE
Comment: NEGATIVE
Comment: NEGATIVE
Comment: NEGATIVE
Comment: NEGATIVE
Comment: NORMAL
Neisseria Gonorrhea: NEGATIVE
Trichomonas: NEGATIVE

## 2021-05-14 LAB — URINE CULTURE

## 2021-05-17 ENCOUNTER — Other Ambulatory Visit: Payer: Self-pay | Admitting: Nurse Practitioner

## 2021-05-17 MED ORDER — AMOXICILLIN 500 MG PO CAPS
500.0000 mg | ORAL_CAPSULE | Freq: Three times a day (TID) | ORAL | 0 refills | Status: AC
  Filled 2021-05-17: qty 21, 7d supply, fill #0

## 2021-05-18 ENCOUNTER — Other Ambulatory Visit: Payer: Self-pay

## 2021-06-04 ENCOUNTER — Other Ambulatory Visit: Payer: Self-pay

## 2021-06-04 ENCOUNTER — Ambulatory Visit (INDEPENDENT_AMBULATORY_CARE_PROVIDER_SITE_OTHER): Payer: Self-pay | Admitting: Family Medicine

## 2021-06-04 ENCOUNTER — Other Ambulatory Visit (HOSPITAL_COMMUNITY)
Admission: RE | Admit: 2021-06-04 | Discharge: 2021-06-04 | Disposition: A | Discharge status: Home / Self Care | Payer: No Typology Code available for payment source | Source: Ambulatory Visit | Attending: Family Medicine | Admitting: Family Medicine

## 2021-06-04 VITALS — BP 120/60 | Ht 65.0 in | Wt 158.6 lb

## 2021-06-04 DIAGNOSIS — N898 Other specified noninflammatory disorders of vagina: Secondary | ICD-10-CM

## 2021-06-04 DIAGNOSIS — R300 Dysuria: Secondary | ICD-10-CM

## 2021-06-04 MED ORDER — NYSTATIN 100000 UNIT/GM EX CREA
1.0000 "application " | TOPICAL_CREAM | Freq: Two times a day (BID) | CUTANEOUS | 0 refills | Status: AC
  Filled 2021-06-04: qty 15, 7d supply, fill #0

## 2021-06-04 MED FILL — Tranexamic Acid Tab 650 MG: ORAL | 5 days supply | Qty: 30 | Fill #2 | Status: AC

## 2021-06-04 NOTE — Progress Notes (Signed)
   GYNECOLOGY OFFICE VISIT NOTE  History:  40 y.o. added on to my schedule from RN visit today for vulvar burning/itching. Per chart review has recently been treated for yeast infection as well as UTI. Denies bleeding, pelvic pain or other concerns.   Most bothersome was burning on outside This time around 1 week Recent UTI treatment 8/15 Itching on inside Vaginal discharge now transparent with white Denies use of soap Denies douching Reports mostly wears cotton underwear. Reports mostly happens after period Does not use lubricant   The following portions of the patient's history were reviewed and updated as appropriate: allergies, current medications, past family history, past medical history, past social history, past surgical history and problem list.    Review of Systems:  Pertinent items noted in HPI Review of Systems  Constitutional:  Negative for chills and fever.  Genitourinary:  Negative for dysuria.   Objective:  Physical Exam BP 120/60   Ht '5\' 5"'$  (1.651 m)   Wt 158 lb 9.6 oz (71.9 kg)   LMP 05/21/2021 (Exact Date)   BMI 26.39 kg/m  Physical Exam Vitals and nursing note reviewed. Exam conducted with a chaperone present.  Constitutional:      Appearance: Normal appearance.  HENT:     Head: Normocephalic.  Cardiovascular:     Rate and Rhythm: Normal rate.     Pulses: Normal pulses.  Genitourinary:    Comments: Outer vulva with redness without any lesions, no evidence of herpetic lesions, no evidence of verroucous lesions Neurological:     Mental Status: She is alert.      Assessment & Plan:  1. Dysuria - UA sent  2. Vaginal irritation 3. Vaginal discharge Swabs sent. Unclear etiology. Potentially vulvar candidal infection. Has previously trialed miconazole ointment but does not recall if helpful.  -Will trial Nystatin.  - Discussed cotton underwear and avoiding soaps and other chemicals in the general area -Also sent swabs for vaginitis, GC/CT,  trichomonas prior to visit converted from RN to MD visit. - follow up at next appt (already scheduled for annual visit on 9/13) Routine preventative health maintenance measures emphasized. Please refer to After Visit Summary for other counseling recommendations.     Total face-to-face time with patient: 20 minutes.  Over 50% of encounter was spent on counseling and coordination of care.  Renard Matter, MD, MPH OB Fellow, [place] for [facility], [place]

## 2021-06-04 NOTE — Progress Notes (Signed)
Pt here today for vaginal discharge, burning, itching x 1 week but states is getting worse. Has hx of yeast infection. Denies all urinary symptoms. Has burning on external skin on vagina. Has not used any Monistat OTC medication for treatment.  Pt advised to have self swab and urine culture repeat today.  Reviewed with Dr Cy Blamer. Pt advised to be seen today for provider visit. Pt agreeable to plan of care.   Colletta Maryland, RN

## 2021-06-05 ENCOUNTER — Other Ambulatory Visit: Payer: Self-pay

## 2021-06-05 LAB — CERVICOVAGINAL ANCILLARY ONLY
Bacterial Vaginitis (gardnerella): POSITIVE — AB
Candida Glabrata: NEGATIVE
Candida Vaginitis: POSITIVE — AB
Chlamydia: NEGATIVE
Comment: NEGATIVE
Comment: NEGATIVE
Comment: NEGATIVE
Comment: NEGATIVE
Comment: NEGATIVE
Comment: NORMAL
Neisseria Gonorrhea: NEGATIVE
Trichomonas: NEGATIVE

## 2021-06-06 LAB — URINE CULTURE

## 2021-06-09 ENCOUNTER — Other Ambulatory Visit: Payer: Self-pay | Admitting: Family Medicine

## 2021-06-09 MED ORDER — FLUCONAZOLE 150 MG PO TABS
150.0000 mg | ORAL_TABLET | Freq: Every day | ORAL | 0 refills | Status: AC
  Filled 2021-06-09: qty 2, 5d supply, fill #0

## 2021-06-09 MED ORDER — METRONIDAZOLE 500 MG PO TABS
500.0000 mg | ORAL_TABLET | Freq: Two times a day (BID) | ORAL | 0 refills | Status: AC
  Filled 2021-06-09: qty 14, 7d supply, fill #0

## 2021-06-09 NOTE — Progress Notes (Signed)
Treatment for BV and candida sent

## 2021-06-10 ENCOUNTER — Other Ambulatory Visit: Payer: Self-pay

## 2021-06-10 ENCOUNTER — Telehealth: Payer: Self-pay

## 2021-06-10 NOTE — Telephone Encounter (Addendum)
-----   Message from Renard Matter, MD sent at 06/09/2021 11:53 PM EDT -----  This is the patient who we discussed and both saw on 8/25 (Last thursday). Looks like she has another episode of BV and yeast infection. I would recommend another round of antibiotics (Flagyl '500mg'$  BID for 7 days) and also a round of diflucan '150mg'$  once (and can repeat dose at 72 hours if still having itching).  I sent the medications to the pharmacy on file. Would you be able to call and discuss this with her?   She should also have a follow up in a month to further discuss management of recurrent BV since she's gotten it so frequently. Would you be able to help get her on my schedule?  Thanks! Dr. Cy Blamer

## 2021-06-10 NOTE — Telephone Encounter (Signed)
Call placed to pt with interpreter Eda. Spoke with pt. Pt given results and recommendations per Dr Cy Blamer. Pt agreeable to plan of care. Pt has scheduled annual exam on 9/13 and encouraged and advised to discuss options of more aggressive treatment for recurrent BV. Pt verbalized understanding.   Colletta Maryland, RN

## 2021-06-12 IMAGING — MG MM DIGITAL SCREENING BILAT W/ TOMO AND CAD
8 series · 8 of 24 positions shown · non-contrast
Comparison: None.

CLINICAL DATA: Screening.

EXAM:
DIGITAL SCREENING BILATERAL MAMMOGRAM WITH TOMOSYNTHESIS AND CAD
TECHNIQUE: Bilateral screening digital craniocaudal and mediolateral oblique
mammograms were obtained. Bilateral screening digital breast
tomosynthesis was performed. The images were evaluated with
computer-aided detection.

[L CC synth-2D]
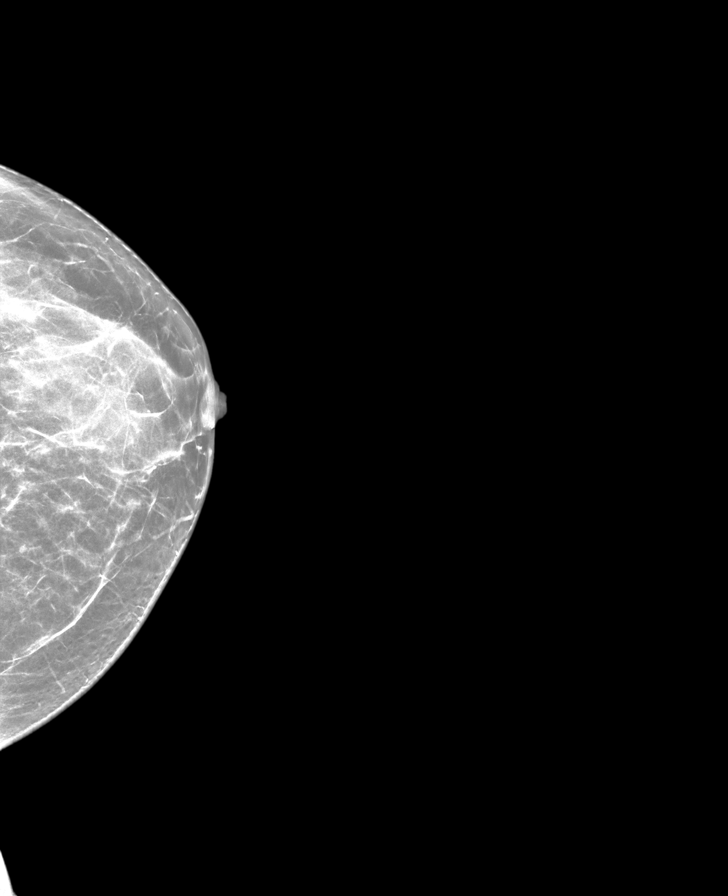

[L MLO synth-2D]
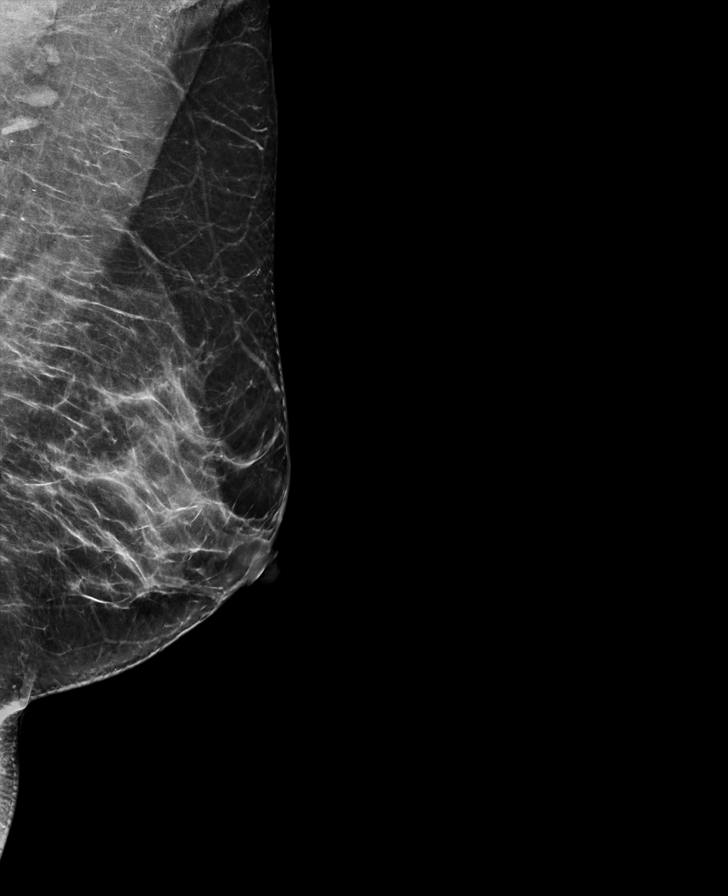

[R MLO synth-2D]
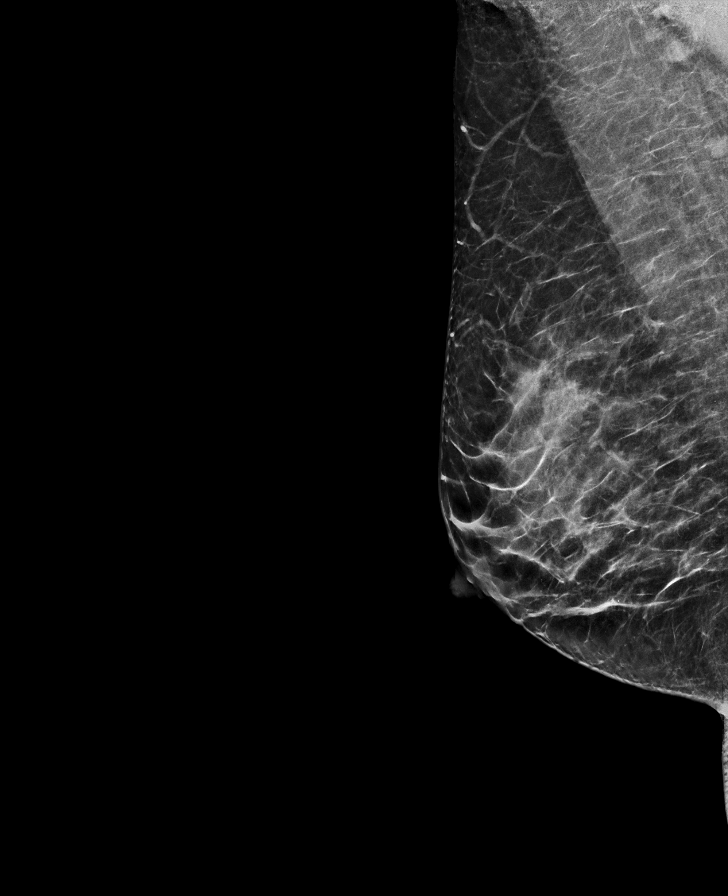

[R CC synth-2D]
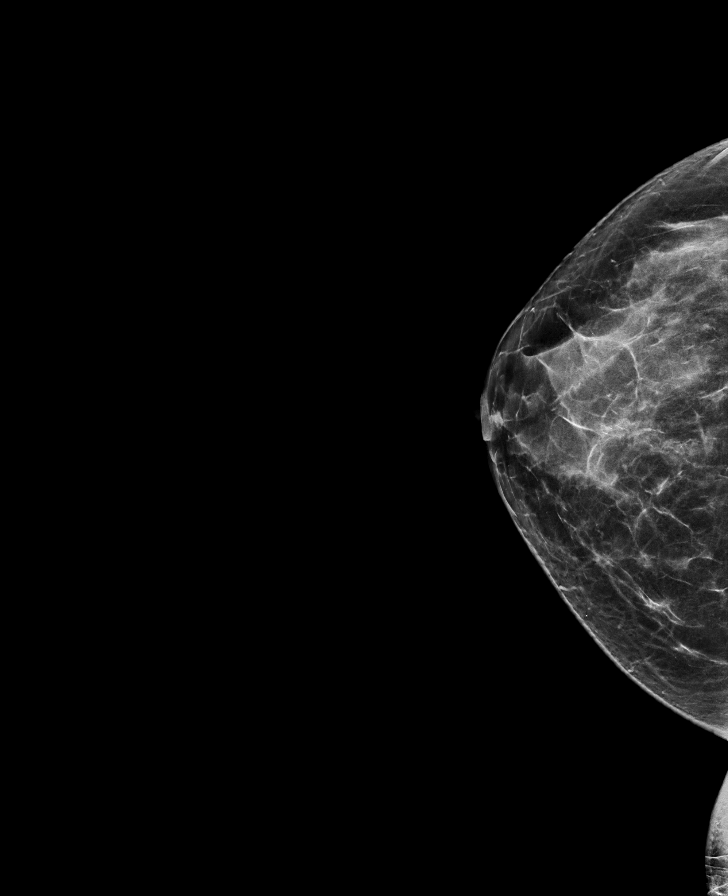

[L CC tomo · tomo slice 29/57.0]
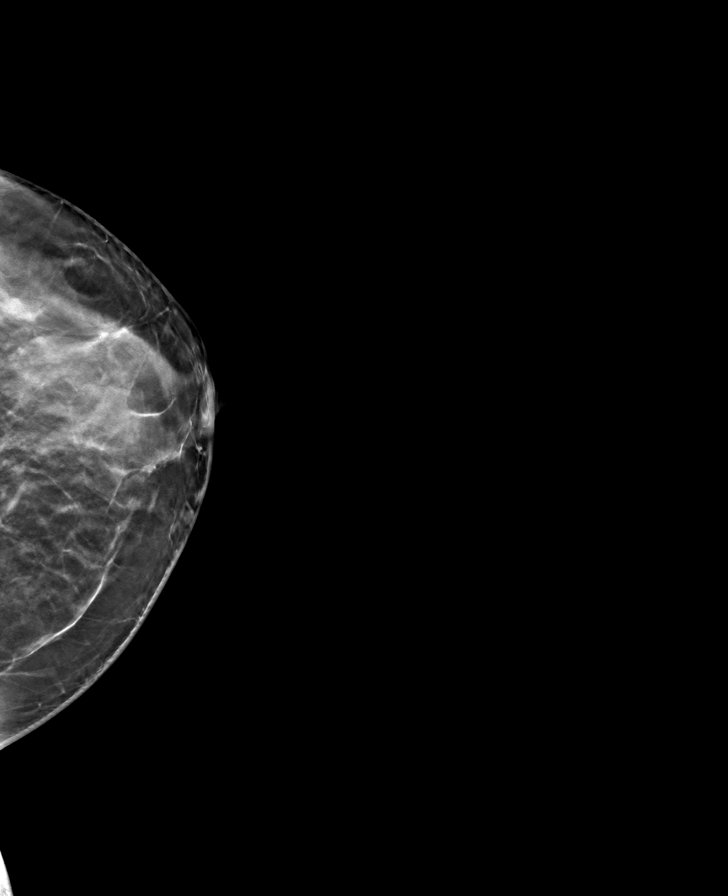

[R CC tomo · tomo slice 31/62.0]
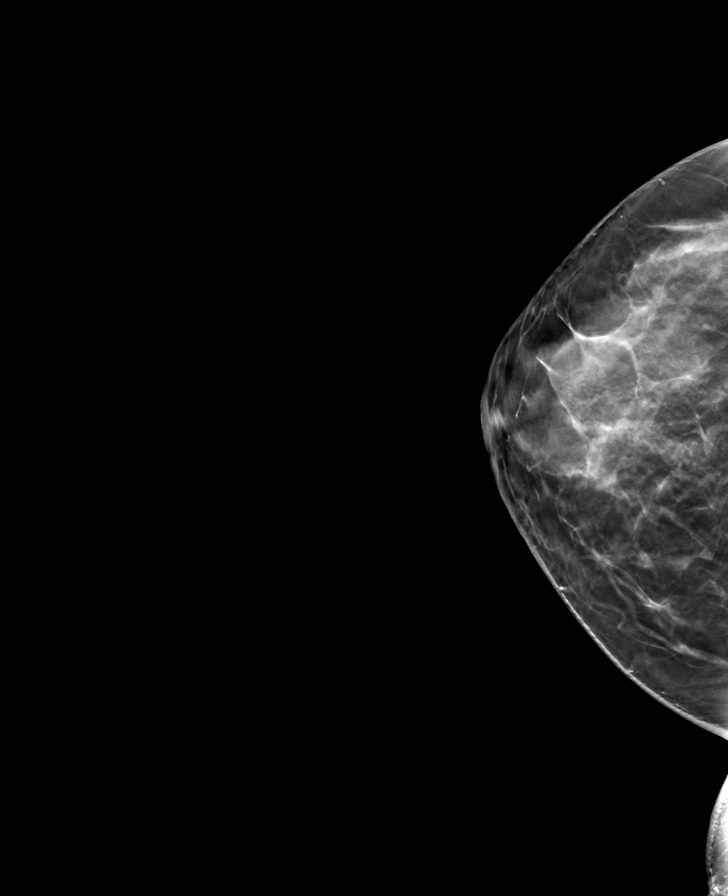

[R MLO tomo · tomo slice 30/59.0]
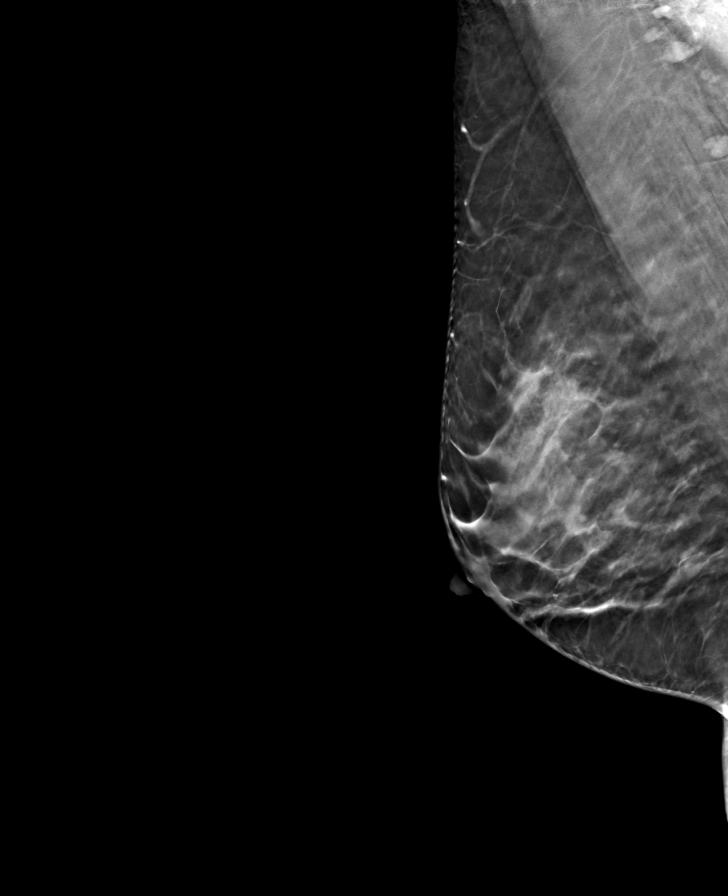

[L MLO tomo · tomo slice 34/67.0]
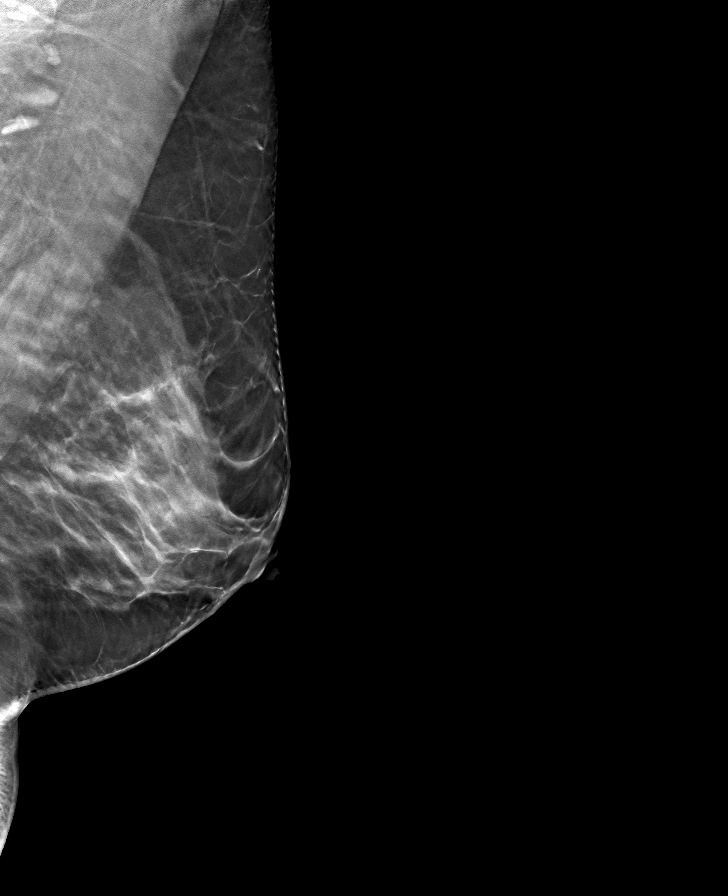

[8 of 24 positions shown; findings below may reference images not displayed]

ACR Breast Density Category c: The breast tissue is heterogeneously
dense, which may obscure small masses
FINDINGS: There are no findings suspicious for malignancy. The images were
evaluated with computer-aided detection.
IMPRESSION: No mammographic evidence of malignancy. A result letter of this
screening mammogram will be mailed directly to the patient.

RECOMMENDATION:
Screening mammogram in one year. (Code:CQ-1-UDH)

BI-RADS CATEGORY  1: Negative.

## 2021-06-23 ENCOUNTER — Other Ambulatory Visit: Payer: Self-pay

## 2021-06-23 ENCOUNTER — Ambulatory Visit (INDEPENDENT_AMBULATORY_CARE_PROVIDER_SITE_OTHER): Payer: Self-pay

## 2021-06-23 VITALS — BP 102/64 | HR 57 | Wt 157.5 lb

## 2021-06-23 DIAGNOSIS — Z0000 Encounter for general adult medical examination without abnormal findings: Secondary | ICD-10-CM

## 2021-06-23 DIAGNOSIS — Z113 Encounter for screening for infections with a predominantly sexual mode of transmission: Secondary | ICD-10-CM

## 2021-06-23 NOTE — Progress Notes (Signed)
Patient reports reoccurring urinary tract infections along with other vaginal infections. She currently does not have any symptoms but wants to know why she continues to have these problems

## 2021-06-23 NOTE — Progress Notes (Signed)
   GYNECOLOGY PROGRESS NOTE  History:  40 y.o. No obstetric history on file. presents to [facility] office today for problem gyn visit. She is here to discuss recurrent bacterial infections and requesting STD testing. She reports prior to every period she notices and increase in vaginal infection that are bothersome to her. After her period, symptoms resolve. She was recently treated for BV and completed the antibiotic course about 2 weeks ago. She is not currently having symptoms today.   The following portions of the patient's history were reviewed and updated as appropriate: allergies, current medications, past family history, past medical history, past social history, past surgical history and problem list. Last pap smear on 12/2020 was abnormal,  LSIL, +HRHPV; s/p colpo 12/2020  Health Maintenance Due  Topic Date Due   TETANUS/TDAP  Never done   COVID-19 Vaccine (3 - Booster for Pfizer series) 06/25/2020   INFLUENZA VACCINE  05/11/2021     Review of Systems:  Pertinent items are noted in HPI.   Objective:  Physical Exam Blood pressure 102/64, pulse (!) 57, weight 157 lb 8 oz (71.4 kg), last menstrual period 06/18/2021. VS reviewed, nursing note reviewed,  Constitutional: well developed, well nourished, no distress HEENT: normocephalic CV: normal rate Pulm/chest wall: normal effort Breast Exam: deferred Neuro: alert and oriented x 3 Skin: warm, dry Psych: affect normal  Assessment & Plan:  1. STD screening - Hepatitis C Antibody - HIV Antibody (routine testing w rflx) - Hepatitis B Surface AntiGEN - RPR  2. Recurrent BV - Discussed hormonal changes during menstrual cycles may be contributing factor to vaginal infections - Discussed preventative measures including avoid washing with soaps, avoid perfumes, wear cotton underwear. - May use boric acid suppositories    Return to office in 6 months for well woman exam and pap  Renee Harder, CNM 06/23/21 8:51 AM

## 2021-06-24 LAB — HEPATITIS B SURFACE ANTIGEN: Hepatitis B Surface Ag: NEGATIVE

## 2021-06-24 LAB — HEPATITIS C ANTIBODY: Hep C Virus Ab: 0.1 s/co ratio (ref 0.0–0.9)

## 2021-06-24 LAB — RPR: RPR Ser Ql: NONREACTIVE

## 2021-06-24 LAB — HIV ANTIBODY (ROUTINE TESTING W REFLEX): HIV Screen 4th Generation wRfx: NONREACTIVE

## 2021-06-25 ENCOUNTER — Other Ambulatory Visit: Payer: Self-pay

## 2021-06-25 ENCOUNTER — Encounter: Payer: Self-pay | Admitting: Physician Assistant

## 2021-06-25 ENCOUNTER — Ambulatory Visit: Payer: Self-pay | Attending: Physician Assistant | Admitting: Physician Assistant

## 2021-06-25 VITALS — BP 111/69 | HR 75 | Ht 65.0 in | Wt 157.5 lb

## 2021-06-25 DIAGNOSIS — D500 Iron deficiency anemia secondary to blood loss (chronic): Secondary | ICD-10-CM

## 2021-06-25 DIAGNOSIS — N920 Excessive and frequent menstruation with regular cycle: Secondary | ICD-10-CM

## 2021-06-25 DIAGNOSIS — R21 Rash and other nonspecific skin eruption: Secondary | ICD-10-CM

## 2021-06-25 DIAGNOSIS — Z789 Other specified health status: Secondary | ICD-10-CM

## 2021-06-25 DIAGNOSIS — R5383 Other fatigue: Secondary | ICD-10-CM

## 2021-06-25 DIAGNOSIS — E559 Vitamin D deficiency, unspecified: Secondary | ICD-10-CM

## 2021-06-25 NOTE — Progress Notes (Signed)
Patient ID: Natalie Aguilar, female   DOB: 1981/10/11, 40 y.o.   MRN: [id number]   Natalie Aguilar, is a 40 y.o. female  [id number]  [id number]  DOB - 1981/08/26  Chief Complaint  Patient presents with   Referral       Subjective:   Natalie Aguilar is a 40 y.o. female here today for fatigue and dermatology referral.  Previously she was anemic and has not had iron levels checked in a long time.  Not taking iron supplements. Periods are heavy esp 1 day then "normal" flow.  Periods are still regular.  No perimenopausal type symptoms.    She has a recurring face rash she has help with thru dermatology and wants to see them again.     Patient has No headache, No chest pain, No abdominal pain - No Nausea, No new weakness tingling or numbness, No Cough - SOB.  No problems updated.  ALLERGIES: No Known Allergies  PAST MEDICAL HISTORY: History reviewed. No pertinent past medical history.  MEDICATIONS AT HOME: Prior to Admission medications   Medication Sig Start Date End Date Taking? Authorizing Provider  tranexamic acid (LYSTEDA) 650 MG TABS tablet TAKE 2 TABLETS (1,300 MG TOTAL) BY MOUTH 3 (THREE) TIMES DAILY. TAKE DURING MENSES FOR A MAXIMUM OF FIVE DAYS 10/20/20 10/20/21 Yes Griffin Basil, MD  cetirizine (ZYRTEC ALLERGY) 10 MG tablet Take 1 tablet (10 mg total) by mouth daily. 11/11/20 12/24/20  Jaynee Eagles, PA-C    ROS: Neg HEENT Neg resp Neg cardiac Neg GI Neg GU Neg MS Neg psych Neg neuro  Objective:   Vitals:   06/25/21 1503  BP: 111/69  Pulse: 75  SpO2: 97%  Weight: 157 lb 8 oz (71.4 kg)  Height: '5\' 5"'$  (1.651 m)   Exam General appearance : Awake, alert, not in any distress. Speech Clear. Not toxic looking HEENT: Atraumatic and Normocephalic Neck: Supple, no JVD. No cervical lymphadenopathy. No thyromegaly Chest: Good air entry bilaterally, CTAB.  No rales/rhonchi/wheezing CVS: S1 S2 regular, no murmurs.  Extremities: B/L Lower Ext shows no edema,  both legs are warm to touch Neurology: Awake alert, and oriented X 3, CN II-XII intact, Non focal Skin: No Rash noted with mask on  Data Review Lab Results  Component Value Date   HGBA1C 5.4 08/15/2017    Assessment & Plan   1. Fatigue, unspecified type - Thyroid Panel With TSH  2. Menorrhagia with regular cycle - Thyroid Panel With TSH  3. Iron deficiency anemia due to chronic blood loss - CBC with Differential/Platelet - Iron, TIBC and Ferritin Panel  4. Language barrier [employer] "Verdis Frederickson" interpreters used and additional time performing visit was required.   5. Vitamin D deficiency - Vitamin D, 25-hydroxy  6. Facial rash - Ambulatory referral to Dermatology    Patient have been counseled extensively about nutrition and exercise. Other issues discussed during this visit include: low cholesterol diet, weight control and daily exercise, foot care, annual eye examinations at Ophthalmology, importance of adherence with medications and regular follow-up. We also discussed long term complications of uncontrolled diabetes and hypertension.   Return if symptoms worsen or fail to improve.  The patient was given clear instructions to go to ER or return to medical center if symptoms don't improve, worsen or new problems develop. The patient verbalized understanding. The patient was told to call to get lab results if they haven't heard anything in the next week.      Freeman Caldron, PA-C [facility] and [place], [place]   06/25/2021, 3:35 PM

## 2021-06-25 NOTE — Progress Notes (Signed)
Natalie Aguilar [id number]

## 2021-06-26 LAB — IRON,TIBC AND FERRITIN PANEL
Ferritin: 5 ng/mL — ABNORMAL LOW (ref 15–150)
Iron Saturation: 9 % — CL (ref 15–55)
Iron: 35 ug/dL (ref 27–159)
Total Iron Binding Capacity: 407 ug/dL (ref 250–450)
UIBC: 372 ug/dL (ref 131–425)

## 2021-06-26 LAB — CBC WITH DIFFERENTIAL/PLATELET
Basophils Absolute: 0 10*3/uL (ref 0.0–0.2)
Basos: 0 %
EOS (ABSOLUTE): 0.1 10*3/uL (ref 0.0–0.4)
Eos: 2 %
Hematocrit: 35 % (ref 34.0–46.6)
Hemoglobin: 10.7 g/dL — ABNORMAL LOW (ref 11.1–15.9)
Immature Grans (Abs): 0 10*3/uL (ref 0.0–0.1)
Immature Granulocytes: 0 %
Lymphocytes Absolute: 1.8 10*3/uL (ref 0.7–3.1)
Lymphs: 35 %
MCH: 22.6 pg — ABNORMAL LOW (ref 26.6–33.0)
MCHC: 30.6 g/dL — ABNORMAL LOW (ref 31.5–35.7)
MCV: 74 fL — ABNORMAL LOW (ref 79–97)
Monocytes Absolute: 0.4 10*3/uL (ref 0.1–0.9)
Monocytes: 8 %
Neutrophils Absolute: 2.8 10*3/uL (ref 1.4–7.0)
Neutrophils: 55 %
Platelets: 251 10*3/uL (ref 150–450)
RBC: 4.74 x10E6/uL (ref 3.77–5.28)
RDW: 16.1 % — ABNORMAL HIGH (ref 11.7–15.4)
WBC: 5.1 10*3/uL (ref 3.4–10.8)

## 2021-06-26 LAB — THYROID PANEL WITH TSH
Free Thyroxine Index: 2.1 (ref 1.2–4.9)
T3 Uptake Ratio: 29 % (ref 24–39)
T4, Total: 7.1 ug/dL (ref 4.5–12.0)
TSH: 1.02 u[IU]/mL (ref 0.450–4.500)

## 2021-06-26 LAB — VITAMIN D 25 HYDROXY (VIT D DEFICIENCY, FRACTURES): Vit D, 25-Hydroxy: 23.9 ng/mL — ABNORMAL LOW (ref 30.0–100.0)

## 2021-06-29 ENCOUNTER — Other Ambulatory Visit: Payer: Self-pay | Admitting: Physician Assistant

## 2021-06-29 ENCOUNTER — Ambulatory Visit: Payer: No Typology Code available for payment source

## 2021-06-29 ENCOUNTER — Other Ambulatory Visit: Payer: Self-pay

## 2021-06-29 MED ORDER — VITAMIN D (ERGOCALCIFEROL) 1.25 MG (50000 UNIT) PO CAPS
50000.0000 [IU] | ORAL_CAPSULE | ORAL | 0 refills | Status: DC
  Filled 2021-06-29: qty 12, 84d supply, fill #0

## 2021-06-29 MED ORDER — IRON (FERROUS SULFATE) 325 (65 FE) MG PO TABS
325.0000 mg | ORAL_TABLET | Freq: Every day | ORAL | 1 refills | Status: DC
  Filled 2021-06-29: qty 100, fill #0

## 2021-07-01 ENCOUNTER — Other Ambulatory Visit: Payer: Self-pay

## 2021-07-01 MED FILL — Tranexamic Acid Tab 650 MG: ORAL | 5 days supply | Qty: 30 | Fill #3 | Status: AC

## 2021-07-03 ENCOUNTER — Other Ambulatory Visit: Payer: Self-pay

## 2021-07-09 ENCOUNTER — Ambulatory Visit: Payer: Self-pay | Attending: Nurse Practitioner

## 2021-07-09 ENCOUNTER — Other Ambulatory Visit: Payer: Self-pay

## 2021-07-23 ENCOUNTER — Other Ambulatory Visit (HOSPITAL_COMMUNITY)
Admission: RE | Admit: 2021-07-23 | Discharge: 2021-07-23 | Disposition: A | Discharge status: Home / Self Care | Payer: No Typology Code available for payment source | Source: Ambulatory Visit | Attending: Family Medicine | Admitting: Family Medicine

## 2021-07-23 ENCOUNTER — Other Ambulatory Visit: Payer: Self-pay

## 2021-07-23 ENCOUNTER — Ambulatory Visit (INDEPENDENT_AMBULATORY_CARE_PROVIDER_SITE_OTHER): Payer: Self-pay

## 2021-07-23 VITALS — BP 115/56 | HR 63 | Wt 157.8 lb

## 2021-07-23 DIAGNOSIS — Z3200 Encounter for pregnancy test, result unknown: Secondary | ICD-10-CM

## 2021-07-23 DIAGNOSIS — N898 Other specified noninflammatory disorders of vagina: Secondary | ICD-10-CM | POA: Insufficient documentation

## 2021-07-23 DIAGNOSIS — Z3201 Encounter for pregnancy test, result positive: Secondary | ICD-10-CM

## 2021-07-23 DIAGNOSIS — O09299 Supervision of pregnancy with other poor reproductive or obstetric history, unspecified trimester: Secondary | ICD-10-CM | POA: Insufficient documentation

## 2021-07-23 DIAGNOSIS — O09529 Supervision of elderly multigravida, unspecified trimester: Secondary | ICD-10-CM | POA: Insufficient documentation

## 2021-07-23 LAB — POCT PREGNANCY, URINE: Preg Test, Ur: POSITIVE — AB

## 2021-07-23 MED ORDER — TERCONAZOLE 0.4 % VA CREA
1.0000 | TOPICAL_CREAM | Freq: Every day | VAGINAL | 0 refills | Status: DC
  Filled 2021-07-23: qty 45, 7d supply, fill #0

## 2021-07-23 NOTE — Progress Notes (Signed)
Here today with complaint of white, clumpy vaginal discharge with itching. Pt reports recurrent yeast infection each month prior to menstrual period. Reports diflucan x 2 improves symptoms. Pt was also recommended to try boric acid suppositories and has tried these once; reports taking nightly x 7 nights. Has not tried this for current symptoms.   Pt states her menstrual period is 5 days late and requests a UPT; result is positive. Reviewed dating with patient:   LMP: 06/04/21 EDD: 03/25/22 5w today  OB history reviewed. Pt is G4P3. Reports pre-eclampsia with first pregnancy. Reviewed medications and allergies with patient. Pt currently taking high dose vitamin d weekly; Ernestina Patches, MD states pt can discontinue vitamin d for now. Recommended pt begin prenatal vitamin and schedule prenatal care. States that boric acid is safe for pregnancy, but MD recommends 2 cups of vinegar in warm bath to use in place of boric acid. Provider recommendations given to patient. Terazol cream ordered for yeast infection symptoms. Explained we will contact patient with any abnormal results. Pt will schedule new ob appts prior to leaving the office.   [name], RN 07/23/2021  5:35 PM

## 2021-07-24 LAB — CERVICOVAGINAL ANCILLARY ONLY
Bacterial Vaginitis (gardnerella): NEGATIVE
Candida Glabrata: NEGATIVE
Candida Vaginitis: POSITIVE — AB
Chlamydia: NEGATIVE
Comment: NEGATIVE
Comment: NEGATIVE
Comment: NEGATIVE
Comment: NEGATIVE
Comment: NEGATIVE
Comment: NORMAL
Neisseria Gonorrhea: NEGATIVE
Trichomonas: NEGATIVE

## 2021-08-03 NOTE — Progress Notes (Signed)
Attestation of Attending Supervision of clinical support staff: I agree with the care provided to this patient and was available for any consultation.  I have reviewed the RN's note and chart. I was available for consult and to see the patient if needed.   Yuuki Skeens Niles Syann Cupples, MD, MPH, ABFM Attending Physician Faculty Practice- [facility]  

## 2021-08-27 ENCOUNTER — Other Ambulatory Visit: Payer: Self-pay

## 2021-08-27 ENCOUNTER — Other Ambulatory Visit (HOSPITAL_COMMUNITY)
Admission: RE | Admit: 2021-08-27 | Discharge: 2021-08-27 | Disposition: A | Discharge status: Home / Self Care | Payer: No Typology Code available for payment source | Source: Ambulatory Visit | Attending: Nurse Practitioner | Admitting: Nurse Practitioner

## 2021-08-27 ENCOUNTER — Encounter: Payer: Self-pay | Admitting: Nurse Practitioner

## 2021-08-27 ENCOUNTER — Ambulatory Visit (INDEPENDENT_AMBULATORY_CARE_PROVIDER_SITE_OTHER): Payer: Self-pay | Admitting: Nurse Practitioner

## 2021-08-27 VITALS — BP 113/63 | HR 56 | Wt 160.0 lb

## 2021-08-27 DIAGNOSIS — K625 Hemorrhage of anus and rectum: Secondary | ICD-10-CM | POA: Insufficient documentation

## 2021-08-27 DIAGNOSIS — O09299 Supervision of pregnancy with other poor reproductive or obstetric history, unspecified trimester: Secondary | ICD-10-CM

## 2021-08-27 DIAGNOSIS — O09529 Supervision of elderly multigravida, unspecified trimester: Secondary | ICD-10-CM

## 2021-08-27 DIAGNOSIS — O2341 Unspecified infection of urinary tract in pregnancy, first trimester: Secondary | ICD-10-CM

## 2021-08-27 DIAGNOSIS — K5901 Slow transit constipation: Secondary | ICD-10-CM

## 2021-08-27 DIAGNOSIS — R87612 Low grade squamous intraepithelial lesion on cytologic smear of cervix (LGSIL): Secondary | ICD-10-CM

## 2021-08-27 DIAGNOSIS — Z3A09 9 weeks gestation of pregnancy: Secondary | ICD-10-CM

## 2021-08-27 DIAGNOSIS — O0990 Supervision of high risk pregnancy, unspecified, unspecified trimester: Secondary | ICD-10-CM | POA: Insufficient documentation

## 2021-08-27 NOTE — Progress Notes (Signed)
Subjective:   Natalie Aguilar is a 40 y.o. [id number] at 8w6dby LMP being seen today for her first obstetrical visit.  Her obstetrical history is significant for advanced maternal age and history of preeclampsia in first pregnancy . Patient does intend to breast feed. Pregnancy history fully reviewed.  Patient reports no complaints.  HISTORY: OB History  Gravida Para Term Preterm AB Living  '4 3 3 ' 0 0 3  SAB IAB Ectopic Multiple Live Births  0 0 0 0 3    # Outcome Date GA Lbr Len/2nd Weight Sex Delivery Anes PTL Lv  4 Current           3 Term 2009     Vag-Spont   LIV  2 Term 2005     Vag-Spont   LIV  1 Term 2002     Vag-Spont   LIV   Past Medical History:  Diagnosis Date   Medical history non-contributory    Past Surgical History:  Procedure Laterality Date   COLONOSCOPY  07/22/2020   Family History  Problem Relation Age of Onset   Diabetes Mother    Thyroid disease Mother    Fibroids Mother    Fibroids Sister    Colon cancer Neg Hx    Esophageal cancer Neg Hx    Rectal cancer Neg Hx    Stomach cancer Neg Hx    Social History   Tobacco Use   Smoking status: Never   Smokeless tobacco: Never  Vaping Use   Vaping Use: Never used  Substance Use Topics   Alcohol use: Yes    Comment: occasionally    Drug use: No   No Known Allergies Current Outpatient Medications on File Prior to Visit  Medication Sig Dispense Refill   terconazole (TERAZOL 7) 0.4 % vaginal cream Place 1 applicator vaginally at bedtime. 45 g 0   Iron, Ferrous Sulfate, 325 (65 Fe) MG TABS Take 325 mg by mouth daily. (Patient not taking: Reported on 07/23/2021) 100 tablet 1   Vitamin D, Ergocalciferol, (DRISDOL) 1.25 MG (50000 UNIT) CAPS capsule Take 1 capsule (50,000 Units total) by mouth every 7 (seven) days. (Patient not taking: Reported on 07/23/2021) 16 capsule 0   [DISCONTINUED] cetirizine (ZYRTEC ALLERGY) 10 MG tablet Take 1 tablet (10 mg total) by mouth daily. 30 tablet 0   No current  facility-administered medications on file prior to visit.     Exam   Vitals:   08/27/21 0900  BP: 113/63  Pulse: (!) 56  Weight: 160 lb (72.6 kg)   Fetal Heart Rate (bpm): 176  Uterus:     Pelvic Exam: Perineum: no hemorrhoids, normal perineum, no blood seen   Vulva: normal external genitalia, no lesions   Vagina:  deferred   Cervix: deferred    Adnexa: deferred   Bony Pelvis: average  System: General: well-developed, well-nourished female in no acute distress   Breast:  normal appearance, no masses or tenderness   Skin: normal coloration and turgor, no rashes   Neurologic: oriented, normal, negative, normal mood   Extremities: normal strength, tone, and muscle mass, ROM of all joints is normal   HEENT extraocular movement intact and sclera clear, anicteric   Mouth/Teeth deferred   Neck supple and no masses, normal thyroid   Cardiovascular: regular rate and rhythm   Respiratory:  no respiratory distress, normal breath sounds   Abdomen: soft, non-tender; no masses,  no organomegaly     Assessment:   Pregnancy: [id number]  Patient Active Problem List   Diagnosis Date Noted   Supervision of high risk pregnancy, antepartum 08/27/2021   Rectal bleeding 08/27/2021   History of pre-eclampsia in prior pregnancy, currently pregnant 07/23/2021   Advanced maternal age in multigravida 07/23/2021   LGSIL on Pap smear of cervix 12/30/2020   Menorrhagia with irregular cycle 08/29/2020   Candidiasis of vulva and vagina 08/29/2020     Plan:  1. Supervision of high risk pregnancy, antepartum FHT heard with doppler today Schedule anatomy US at next visit No insurance today  - CBC/D/Plt+RPR+Rh+ABO+RubIgG... - Hemoglobin A1c - Culture, OB Urine - [place] MFM OB DETAIL +14 WK; Future - Comp Met (CMET) - Protein / creatinine ratio, urine  2. LGSIL on Pap smear of cervix Had colpo in March - needs repeat pap PP  3. History of pre-eclampsia in prior pregnancy, currently pregnant Normal  BP today  4. Slow transit constipation Has taken miralax previously and has at home Advised to take every other day as needed to have a soft stool daily and see if it will decrease rectal bleeding  5. Rectal bleeding Sees in the toilet after having a BM - has occurred previously  Has had a colonoscopy,  No hemorrhoids or blood seen on exam May need colonoscopy again after the baby is born  74. Antepartum multigravida of advanced maternal age Too early for genetic screening today  7. [redacted] weeks gestation of pregnancy    Initial labs drawn. Continue prenatal vitamins. Genetic Screening discussed, NIPS:  not indicated today - too early . Ultrasound discussed; fetal anatomic survey:  ordered and needs to be scheduled . Problem list reviewed and updated. The nature of [facility] with multiple MDs and other Advanced Practice Providers was explained to patient; also emphasized that residents, students are part of our team. Routine obstetric precautions reviewed. Return in about 4 weeks (around 09/24/2021) for in person ROB.  Total face-to-face time with patient: 40 minutes.  Over 50% of encounter was spent on counseling and coordination of care.     Earlie Server, FNP Family Nurse Practitioner, [facility] for [employer], [place] [facility] 08/27/2021 9:34 AM

## 2021-08-28 LAB — CBC/D/PLT+RPR+RH+ABO+RUBIGG...
Antibody Screen: NEGATIVE
Basophils Absolute: 0 10*3/uL (ref 0.0–0.2)
Basos: 1 %
EOS (ABSOLUTE): 0.1 10*3/uL (ref 0.0–0.4)
Eos: 1 %
HCV Ab: 0.3 s/co ratio (ref 0.0–0.9)
HIV Screen 4th Generation wRfx: NONREACTIVE
Hematocrit: 40.2 % (ref 34.0–46.6)
Hemoglobin: 12.7 g/dL (ref 11.1–15.9)
Hepatitis B Surface Ag: NEGATIVE
Immature Grans (Abs): 0 10*3/uL (ref 0.0–0.1)
Immature Granulocytes: 0 %
Lymphocytes Absolute: 1.5 10*3/uL (ref 0.7–3.1)
Lymphs: 25 %
MCH: 24.5 pg — ABNORMAL LOW (ref 26.6–33.0)
MCHC: 31.6 g/dL (ref 31.5–35.7)
MCV: 78 fL — ABNORMAL LOW (ref 79–97)
Monocytes Absolute: 0.4 10*3/uL (ref 0.1–0.9)
Monocytes: 7 %
Neutrophils Absolute: 4 10*3/uL (ref 1.4–7.0)
Neutrophils: 66 %
Platelets: 227 10*3/uL (ref 150–450)
RBC: 5.19 x10E6/uL (ref 3.77–5.28)
RDW: 18.2 % — ABNORMAL HIGH (ref 11.7–15.4)
RPR Ser Ql: NONREACTIVE
Rh Factor: POSITIVE
Rubella Antibodies, IGG: 6.85 index (ref >0.99)
WBC: 5.9 10*3/uL (ref 3.4–10.8)

## 2021-08-28 LAB — COMPREHENSIVE METABOLIC PANEL
ALT: 12 IU/L (ref 0–32)
AST: 14 IU/L (ref 0–40)
Albumin/Globulin Ratio: 1.8 (ref 1.2–2.2)
Albumin: 4.5 g/dL (ref 3.8–4.8)
Alkaline Phosphatase: 45 IU/L (ref 44–121)
BUN/Creatinine Ratio: 16 (ref 9–23)
BUN: 10 mg/dL (ref 6–24)
Bilirubin Total: 0.5 mg/dL (ref 0.0–1.2)
CO2: 22 mmol/L (ref 20–29)
Calcium: 9 mg/dL (ref 8.7–10.2)
Chloride: 103 mmol/L (ref 96–106)
Creatinine, Ser: 0.63 mg/dL (ref 0.57–1.00)
Globulin, Total: 2.5 g/dL (ref 1.5–4.5)
Glucose: 87 mg/dL (ref 70–99)
Potassium: 3.8 mmol/L (ref 3.5–5.2)
Sodium: 139 mmol/L (ref 134–144)
Total Protein: 7 g/dL (ref 6.0–8.5)
eGFR: 115 mL/min/{1.73_m2} (ref >59)

## 2021-08-28 LAB — HCV INTERPRETATION

## 2021-08-28 LAB — HEMOGLOBIN A1C
Est. average glucose Bld gHb Est-mCnc: 108 mg/dL
Hgb A1c MFr Bld: 5.4 % (ref 4.8–5.6)

## 2021-08-30 LAB — PROTEIN / CREATININE RATIO, URINE
Creatinine, Urine: 170.7 mg/dL
Protein, Ur: 13.4 mg/dL
Protein/Creat Ratio: 79 mg/g creat (ref 0–200)

## 2021-08-30 LAB — CULTURE, OB URINE

## 2021-08-30 LAB — URINE CULTURE, OB REFLEX

## 2021-08-30 MED ORDER — AMOXICILLIN 500 MG PO CAPS
500.0000 mg | ORAL_CAPSULE | Freq: Three times a day (TID) | ORAL | 0 refills | Status: AC
  Filled 2021-08-30: qty 21, 7d supply, fill #0

## 2021-08-30 NOTE — Addendum Note (Signed)
Addended by: Virginia Rochester on: 08/30/2021 09:09 AM   Modules accepted: Orders

## 2021-08-31 ENCOUNTER — Other Ambulatory Visit: Payer: Self-pay

## 2021-08-31 LAB — CERVICOVAGINAL ANCILLARY ONLY
Chlamydia: NEGATIVE
Comment: NEGATIVE
Comment: NORMAL
Neisseria Gonorrhea: NEGATIVE

## 2021-09-01 ENCOUNTER — Other Ambulatory Visit: Payer: Self-pay

## 2021-09-23 ENCOUNTER — Ambulatory Visit (INDEPENDENT_AMBULATORY_CARE_PROVIDER_SITE_OTHER): Payer: Self-pay | Admitting: Obstetrics and Gynecology

## 2021-09-23 ENCOUNTER — Other Ambulatory Visit: Payer: Self-pay

## 2021-09-23 ENCOUNTER — Encounter: Payer: Self-pay | Admitting: Obstetrics and Gynecology

## 2021-09-23 VITALS — BP 106/64 | HR 61 | Wt 166.9 lb

## 2021-09-23 DIAGNOSIS — R87612 Low grade squamous intraepithelial lesion on cytologic smear of cervix (LGSIL): Secondary | ICD-10-CM

## 2021-09-23 DIAGNOSIS — O09299 Supervision of pregnancy with other poor reproductive or obstetric history, unspecified trimester: Secondary | ICD-10-CM

## 2021-09-23 DIAGNOSIS — O0990 Supervision of high risk pregnancy, unspecified, unspecified trimester: Secondary | ICD-10-CM

## 2021-09-23 DIAGNOSIS — O09522 Supervision of elderly multigravida, second trimester: Secondary | ICD-10-CM

## 2021-09-23 MED ORDER — ASPIRIN EC 81 MG PO TBEC
81.0000 mg | DELAYED_RELEASE_TABLET | Freq: Every day | ORAL | 2 refills | Status: DC
Start: 2021-09-23 — End: 2022-03-21
  Filled 2021-09-23 – 2021-12-30 (×2): qty 300, 300d supply, fill #0
  Filled 2022-01-04: qty 100, 100d supply, fill #0

## 2021-09-23 NOTE — Patient Instructions (Signed)
Segundo trimestre de [employer] Second Trimester of Pregnancy El segundo trimestre de Media planner va desde la semana 13 hasta la semana 27. Tambin se dice que va desde el mes 4 hasta el mes 6 de [place]. Este suele ser el momento en el que mejor se siente. Durante el segundo trimestre: Las nuseas del embarazo han disminuido o han desaparecido. Usted puede tener ms energa. Usted puede tener hambre con ms frecuencia. En esta poca, el beb en gestacin (feto) crece muy rpido. Hacia el final del sexto mes, el beb en gestacin puede medir aproximadamente 12 pulgadas y pesar alrededor de 1 libras. Es probable que comience a Surveyor, quantity beb se [employer] las 6 y las [place]. Cambios en el cuerpo durante el segundo trimestre Su organismo contina atravesando por muchos cambios durante este perodo. Los cambios varan y generalmente vuelven a la normalidad despus del nacimiento del beb. Cambios fsicos Aumentar ms peso. Podrn aparecer las primeras estras en las caderas, el vientre (abdomen) y las [place]. [place] [employer] crecern y Tourist information centre manager. Pueden aparecer zonas oscuras o manchas en el rostro. Es posible que se forme una lnea oscura desde el ombligo hasta la zona del pubis (linea nigra). Tal vez haya cambios en el cabello. Cambios en la salud Es posible que tenga dolores de [place]. Es posible que tenga acidez estomacal. Es posible que tenga dificultades para defecar (estreimiento). Es posible que tenga hemorroides o venas abultadas e hinchadas (venas varicosas). Las encas pueden sangrarle. Es posible que haga pis (orine) con mayor frecuencia. Puede sentir dolor en la espalda. Siga estas instrucciones en su casa: Medicamentos Use los medicamentos de venta libre y los recetados solamente como se lo haya indicado el mdico. Algunos medicamentos no son seguros Solicitor. Tome vitaminas prenatales que contengan por lo menos 600 microgramos (mcg) de cido  flico. Comida y bebida Consuma comidas saludables que incluyan lo siguiente: Lambert Mody y verduras frescas. Cereales integrales. Buenas fuentes de protenas, como carne, huevos y tofu. Productos lcteos con bajo contenido de [place]. Evite la carne cruda y el [place], la [place] y el queso sin Radio producer. Es posible que deba tomar medidas para prevenir o tratar los problemas para defecar: Electronics engineer suficiente lquido para Contractor pis (orina) de color amarillo plido. Come alimentos ricos en fibra. Entre ellos, frijoles, cereales integrales y frutas y verduras frescas. Limitar los alimentos con alto contenido de grasa y Location manager. Estos incluyen alimentos fritos o dulces. Actividad Haga ejercicios solamente como se lo haya indicado el mdico. La mayora de las personas pueden realizar su actividad fsica habitual durante el [place]. Intente realizar como mnimo 30 minutos de actividad fsica por lo menos 5 das a la [place]. Deje de hacer ejercicio si tiene dolor o clicos en el vientre o en la zona lumbar. No haga ejercicio si hace demasiado calor, hay demasiada humedad o se encuentra en un lugar de mucha altura (altitud elevada). Evite levantar pesos [employer]. Si lo desea, puede continuar teniendo [employer], a menos que el mdico le indique lo contrario. Alivio del dolor y del Tree surgeon Use un sostn que le brinde buen soporte si le duelen las [place]. Dese baos de asiento con agua tibia para Best boy o las molestias causadas por las hemorroides. Use una crema para las hemorroides si el mdico la autoriza. Descanse con las piernas levantadas (elevadas) si tiene calambres en las piernas o dolor en la parte baja de la espalda. Si desarrolla venas abultadas en las piernas: Use  medias de compresin segn las indicaciones de su mdico. Levante los pies durante 15 minutos, 3 o 4 veces por Training and development officer. Limite la sal en sus alimentos. Seguridad Use el cinturn de seguridad en todo momento mientras  vaya en auto. Hable con el mdico si alguien le est haciendo dao o gritando [place]. Estilo de vida No se d baos de inmersin en agua caliente, baos turcos ni saunas. No se haga duchas vaginales. No use tampones ni toallas higinicas perfumadas. Evite el contacto con las bandejas sanitarias de los gatos y la tierra que estos animales usan. Estos contienen grmenes que pueden daar al beb y causar la prdida del beb ya sea aborto espontneo o muerte fetal. No consuma medicamentos a base de hierbas, drogas ilegales, ni medicamentos que el mdico no haya autorizado. No beba alcohol. No fume ni consuma ningn producto que contenga nicotina o tabaco. Si necesita ayuda para dejar de fumar, consulte al mdico. Instrucciones generales Cumpla con todas las visitas de seguimiento. Esto es importante. Consulte a su mdico acerca de dnde se dictan clases prenatales cerca de donde vive. Consulte a su mdico sobre los [employer] debe comer o pdale que la ayude a Pension scheme manager a Editor, commissioning. Dnde buscar ms informacin American Pregnancy Association (Asociacin Americana del Embarazo): americanpregnancy.org [employer] of Obstetricians and Gynecologists (Colegio Estadounidense de Obstetras y Gineclogos): www.acog.org Office on Home [employer]ot ([place]): KeywordPortfolios.com.br Comunquese con un mdico si: Tiene un dolor de cabeza que no desaparece despus de Teacher, adult education. Nota cambios en la visin o ve manchas delante de los ojos. Tiene clicos o siente presin o dolor leves en la parte baja del vientre. Sigue sintiendo como si fuera a vomitar (nuseas), vomita o hace deposiciones acuosas (diarrea). Advierte lquido con mal olor que proviene de la vagina. Siente dolor al orinar o hace orina con mal olor. Tiene una gran hinchazn en la cara, las manos, las piernas, los tobillos o los pies. Tiene fiebre. Solicite ayuda de inmediato si: Tiene una prdida de  lquido por la vagina. Tiene sangrado o pequeas prdidas vaginales. Tiene clicos o dolor muy intensos en el vientre. Tiene dificultad para respirar. Sientes dolor en el pecho. Se desmaya. No ha sentido que el beb se moviera durante el perodo de tiempo que le dijo el mdico. Scientist, clinical (histocompatibility and immunogenetics), hinchazn o enrojecimiento nuevos en un brazo o una pierna o se produce un aumento de alguno de estos sntomas. Resumen El segundo trimestre de embarazo va desde la semana 13 hasta la 27 (desde el mes 4 hasta el 6). Consuma comidas saludables. Haga ejercicios tal como le indic el mdico. La mayora de las personas pueden realizar su actividad fsica habitual durante el [place]. No consuma medicamentos a base de hierbas, drogas ilegales, ni medicamentos que el mdico no haya autorizado. No beba alcohol. Llame al mdico si se enferma o si nota algo inusual acerca de su embarazo. Esta informacin no tiene Marine scientist el consejo del mdico. Asegrese de hacerle al mdico cualquier pregunta que tenga. Document Revised: 04/11/2020 Document Reviewed: 04/11/2020 Elsevier Patient Education  [place].

## 2021-09-23 NOTE — Progress Notes (Signed)
Subjective:  Natalie Aguilar is a 40 y.o. [phone] at [redacted]w[redacted]d being seen today for ongoing prenatal care.  She is currently monitored for the following issues for this high-risk pregnancy and has Menorrhagia with irregular cycle; Candidiasis of vulva and vagina; LGSIL on Pap smear of cervix; History of pre-eclampsia in prior pregnancy, currently pregnant; Advanced maternal age in multigravida; Supervision of high risk pregnancy, antepartum; and Rectal bleeding on their problem list.  Patient reports no complaints.  Contractions: Not present. Vag. Bleeding: None.  Movement: Absent. Denies leaking of fluid.   The following portions of the patient's history were reviewed and updated as appropriate: allergies, current medications, past family history, past medical history, past social history, past surgical history and problem list. Problem list updated.  Objective:   Vitals:   09/23/21 1554  BP: 106/64  Pulse: 61  Weight: 166 lb 14.4 oz (75.7 kg)    Fetal Status: Fetal Heart Rate (bpm): 155   Movement: Absent     General:  Alert, oriented and cooperative. Patient is in no acute distress.  Skin: Skin is warm and dry. No rash noted.   Cardiovascular: Normal heart rate noted  Respiratory: Normal respiratory effort, no problems with respiration noted  Abdomen: Soft, gravid, appropriate for gestational age. Pain/Pressure: Absent     Pelvic:  Cervical exam deferred        Extremities: Normal range of motion.  Edema: None  Mental Status: Normal mood and affect. Normal behavior. Normal judgment and thought content.   Urinalysis:      Assessment and Plan:  Pregnancy: [id number] at [redacted]w[redacted]d  1. Supervision of high risk pregnancy, antepartum Stable  Genetic testing reviewed and drawn today  2. Multigravida of advanced maternal age in second trimester Genetic testing as note above  3. History of pre-eclampsia in prior pregnancy, currently pregnant Will start BASA  4. LGSIL on Pap smear of  cervix Pap smear pp  Preterm labor symptoms and general obstetric precautions including but not limited to vaginal bleeding, contractions, leaking of fluid and fetal movement were reviewed in detail with the patient. Please refer to After Visit Summary for other counseling recommendations.  Return in about 4 weeks (around 10/21/2021) for OB visit, face to face, any provider.   Chancy Milroy, MD

## 2021-09-25 ENCOUNTER — Other Ambulatory Visit: Payer: Self-pay

## 2021-09-28 ENCOUNTER — Other Ambulatory Visit: Payer: Self-pay

## 2021-10-06 ENCOUNTER — Encounter (HOSPITAL_COMMUNITY): Payer: Self-pay

## 2021-10-06 ENCOUNTER — Ambulatory Visit (HOSPITAL_COMMUNITY)
Admission: EM | Admit: 2021-10-06 | Discharge: 2021-10-06 | Disposition: A | Discharge status: Home / Self Care | Payer: No Typology Code available for payment source | Attending: Physician Assistant | Admitting: Physician Assistant

## 2021-10-06 DIAGNOSIS — Z20822 Contact with and (suspected) exposure to covid-19: Secondary | ICD-10-CM | POA: Insufficient documentation

## 2021-10-06 DIAGNOSIS — J069 Acute upper respiratory infection, unspecified: Secondary | ICD-10-CM

## 2021-10-06 DIAGNOSIS — R051 Acute cough: Secondary | ICD-10-CM | POA: Insufficient documentation

## 2021-10-06 DIAGNOSIS — R110 Nausea: Secondary | ICD-10-CM | POA: Insufficient documentation

## 2021-10-06 DIAGNOSIS — R0981 Nasal congestion: Secondary | ICD-10-CM | POA: Insufficient documentation

## 2021-10-06 DIAGNOSIS — R109 Unspecified abdominal pain: Secondary | ICD-10-CM | POA: Insufficient documentation

## 2021-10-06 LAB — POC INFLUENZA A AND B ANTIGEN (URGENT CARE ONLY)
INFLUENZA A ANTIGEN, POC: NEGATIVE
INFLUENZA B ANTIGEN, POC: NEGATIVE

## 2021-10-06 MED ORDER — BUDESONIDE 90 MCG/ACT IN AEPB: 1.0000 | INHALATION_SPRAY | Freq: Two times a day (BID) | RESPIRATORY_TRACT | 0 refills | Status: DC

## 2021-10-06 NOTE — ED Triage Notes (Signed)
Pt presents with cough and headaches x 4 days.   States she has been feeling sick x 3 weeks.   States she is pregnant.   Pt reports last night she started having abdominal pain after coughing for a while.

## 2021-10-06 NOTE — Discharge Instructions (Signed)
You tested negative for flu.  We will contact you if you are positive for COVID.  I believe that you have inflammation in your lungs causing your continued cough.  Because you are pregnant we are limited in the medications we can use.  Please try inhaler twice daily.  It is very important that you rinse your mouth and spit out the water after use of this medication to prevent thrush.  Follow-up with your OB/GYN as soon as possible.  If you have any worsening symptoms including shortness of breath, persistent cough, nausea/vomiting, abdominal pain, pelvic pain, vaginal bleeding you need to go to the emergency room.

## 2021-10-06 NOTE — ED Provider Notes (Signed)
[facility]    CSN: [id number] Arrival date & time: 10/06/21  1755      History   Chief Complaint Chief Complaint  Patient presents with   Cough   Headache   Abdominal Pain    HPI Natalie Aguilar is a 40 y.o. female.   Patient presents today with a 4-day history of worsening URI symptoms.  Reports she has had nasal congestion for several weeks but over the past 3 to 4 days she has developed cough to the point that it is keeping her up at night and she has had abdominal and chest muscle strain due to coughing fits.  She is currently [redacted] weeks pregnant has been limited to medication she can take.  She denies any current abdominal pain, pelvic pain, vaginal bleeding.  She has had some nausea but denies vomiting.  She is up-to-date on COVID and influenza vaccine.  Reports household sick contacts with similar symptoms and tested negative for COVID and flu.  She denies any history of asthma, allergies, COPD.  She does not smoke.  She was recently treated for UTI approximately 6 weeks ago but does not remember the name of the antibiotic.  Denies additional antibiotic use since that time.   Past Medical History:  Diagnosis Date   Medical history non-contributory     Patient Active Problem List   Diagnosis Date Noted   Supervision of high risk pregnancy, antepartum 08/27/2021   Rectal bleeding 08/27/2021   History of pre-eclampsia in prior pregnancy, currently pregnant 07/23/2021   Advanced maternal age in multigravida 07/23/2021   LGSIL on Pap smear of cervix 12/30/2020   Menorrhagia with irregular cycle 08/29/2020   Candidiasis of vulva and vagina 08/29/2020    Past Surgical History:  Procedure Laterality Date   COLONOSCOPY  07/22/2020    OB History     Gravida  4   Para  3   Term  3   Preterm      AB      Living  3      SAB      IAB      Ectopic      Multiple      Live Births  3            Home Medications    Prior to Admission  medications   Medication Sig Start Date End Date Taking? Authorizing Provider  Budesonide 90 MCG/ACT inhaler Inhale 1 puff into the lungs 2 (two) times daily. 10/06/21  Yes Kooper Chriswell, Derry Skill, PA-C  aspirin EC 81 MG tablet Take 1 tablet (81 mg total) by mouth daily. Take after 12 weeks for prevention of preeclampsia later in pregnancy 09/23/21   Chancy Milroy, MD  Iron, Ferrous Sulfate, 325 (65 Fe) MG TABS Take 325 mg by mouth daily. Patient not taking: Reported on 07/23/2021 06/29/21   Argentina Donovan, PA-C  Vitamin D, Ergocalciferol, (DRISDOL) 1.25 MG (50000 UNIT) CAPS capsule Take 1 capsule (50,000 Units total) by mouth every 7 (seven) days. Patient not taking: Reported on 07/23/2021 06/29/21   Argentina Donovan, PA-C  cetirizine (ZYRTEC ALLERGY) 10 MG tablet Take 1 tablet (10 mg total) by mouth daily. 11/11/20 12/24/20  Jaynee Eagles, PA-C    Family History Family History  Problem Relation Age of Onset   Diabetes Mother    Thyroid disease Mother    Fibroids Mother    Fibroids Sister    Colon cancer Neg Hx    Esophageal  cancer Neg Hx    Rectal cancer Neg Hx    Stomach cancer Neg Hx     Social History Social History   Tobacco Use   Smoking status: Never   Smokeless tobacco: Never  Vaping Use   Vaping Use: Never used  Substance Use Topics   Alcohol use: Yes    Comment: occasionally    Drug use: No     Allergies   Patient has no known allergies.   Review of Systems Review of Systems  Constitutional:  Positive for activity change and fatigue. Negative for appetite change and fever.  HENT:  Positive for congestion. Negative for sinus pressure, sneezing and sore throat.   Respiratory:  Positive for cough. Negative for shortness of breath.   Cardiovascular:  Negative for chest pain.  Gastrointestinal:  Positive for abdominal pain and nausea. Negative for diarrhea and vomiting.  Neurological:  Negative for dizziness, light-headedness and headaches.    Physical Exam Triage  Vital Signs ED Triage Vitals  Enc Vitals Group     BP 10/06/21 1855 128/81     Pulse Rate 10/06/21 1855 82     Resp 10/06/21 1855 19     Temp 10/06/21 1855 98.4 F (36.9 C)     Temp Source 10/06/21 1855 Oral     SpO2 10/06/21 1855 98 %     Weight --      Height --      Head Circumference --      Peak Flow --      Pain Score 10/06/21 1853 3     Pain Loc --      Pain Edu? --      Excl. in [place]? --    No data found.  Updated Vital Signs BP 128/81 (BP Location: Right Arm)    Pulse 82    Temp 98.4 F (36.9 C) (Oral)    Resp 19    LMP 06/19/2021    SpO2 98%   Visual Acuity Right Eye Distance:   Left Eye Distance:   Bilateral Distance:    Right Eye Near:   Left Eye Near:    Bilateral Near:     Physical Exam   UC Treatments / Results  Labs (all labs ordered are listed, but only abnormal results are displayed) Labs Reviewed  SARS CORONAVIRUS 2 (TAT 6-24 HRS)  POC INFLUENZA A AND B ANTIGEN (URGENT CARE ONLY)    EKG   Radiology No results found.  Procedures Procedures (including critical care time)  Medications Ordered in UC Medications - No data to display  Initial Impression / Assessment and Plan / UC Course  I have reviewed the triage vital signs and the nursing notes.  Pertinent labs & imaging results that were available during my care of the patient were reviewed by me and considered in my medical decision making (see chart for details).     Discussed likely viral etiology given short duration of symptoms.  No evidence of acute infection that would warrant initiation of antibiotics today.  Patient tested negative for flu.  We will send out for COVID.  She is currently pregnant with a high risk pregnancy so we are limited in the medications that can be used.  She has dry reactive cough and so we discussed treatment options.  We will treat with inhaled corticosteroids (Pulmicort) twice daily for 1 week.  She was instructed to rinse her mouth following use of this  medication to prevent thrush.  Recommended she contact  her OB/GYN and tell them what she has been prescribed in case they prefer a different medication or treatment option.  Discussed that if she has any worsening symptoms including chest pain, shortness of breath, abdominal pain, pelvic pain, vaginal bleeding, weakness she needs to go to the emergency room.  Strict return precautions given to which she expressed understanding.  Final Clinical Impressions(s) / UC Diagnoses   Final diagnoses:  Upper respiratory tract infection, unspecified type  Acute cough     Discharge Instructions      You tested negative for flu.  We will contact you if you are positive for COVID.  I believe that you have inflammation in your lungs causing your continued cough.  Because you are pregnant we are limited in the medications we can use.  Please try inhaler twice daily.  It is very important that you rinse your mouth and spit out the water after use of this medication to prevent thrush.  Follow-up with your OB/GYN as soon as possible.  If you have any worsening symptoms including shortness of breath, persistent cough, nausea/vomiting, abdominal pain, pelvic pain, vaginal bleeding you need to go to the emergency room.     ED Prescriptions     Medication Sig Dispense Auth. Provider   Budesonide 90 MCG/ACT inhaler Inhale 1 puff into the lungs 2 (two) times daily. 1 each Neda Willenbring, Derry Skill, PA-C      PDMP not reviewed this encounter.   Terrilee Croak, PA-C 10/06/21 2006

## 2021-10-07 ENCOUNTER — Encounter (HOSPITAL_COMMUNITY): Payer: Self-pay | Admitting: *Deleted

## 2021-10-07 ENCOUNTER — Inpatient Hospital Stay (HOSPITAL_COMMUNITY)
Admission: EM | Admit: 2021-10-07 | Discharge: 2021-10-07 | Disposition: A | Discharge status: Home / Self Care | Payer: No Typology Code available for payment source | Attending: Obstetrics and Gynecology | Admitting: Obstetrics and Gynecology

## 2021-10-07 ENCOUNTER — Other Ambulatory Visit: Payer: Self-pay

## 2021-10-07 ENCOUNTER — Telehealth: Payer: Self-pay | Admitting: General Practice

## 2021-10-07 DIAGNOSIS — O09522 Supervision of elderly multigravida, second trimester: Secondary | ICD-10-CM | POA: Insufficient documentation

## 2021-10-07 DIAGNOSIS — J069 Acute upper respiratory infection, unspecified: Secondary | ICD-10-CM | POA: Insufficient documentation

## 2021-10-07 DIAGNOSIS — Z3A15 15 weeks gestation of pregnancy: Secondary | ICD-10-CM

## 2021-10-07 DIAGNOSIS — R519 Headache, unspecified: Secondary | ICD-10-CM | POA: Insufficient documentation

## 2021-10-07 DIAGNOSIS — R1010 Upper abdominal pain, unspecified: Secondary | ICD-10-CM | POA: Insufficient documentation

## 2021-10-07 DIAGNOSIS — O99512 Diseases of the respiratory system complicating pregnancy, second trimester: Secondary | ICD-10-CM | POA: Insufficient documentation

## 2021-10-07 DIAGNOSIS — N898 Other specified noninflammatory disorders of vagina: Secondary | ICD-10-CM | POA: Insufficient documentation

## 2021-10-07 DIAGNOSIS — O26899 Other specified pregnancy related conditions, unspecified trimester: Secondary | ICD-10-CM

## 2021-10-07 DIAGNOSIS — O26892 Other specified pregnancy related conditions, second trimester: Secondary | ICD-10-CM | POA: Insufficient documentation

## 2021-10-07 HISTORY — DX: Gestational (pregnancy-induced) hypertension without significant proteinuria, unspecified trimester: O13.9

## 2021-10-07 HISTORY — DX: Depression, unspecified: F32.A

## 2021-10-07 HISTORY — DX: Urinary tract infection, site not specified: N39.0

## 2021-10-07 LAB — URINALYSIS, ROUTINE W REFLEX MICROSCOPIC
Bilirubin Urine: NEGATIVE
Glucose, UA: NEGATIVE mg/dL
Hgb urine dipstick: NEGATIVE
Ketones, ur: 20 mg/dL — AB
Leukocytes,Ua: NEGATIVE
Nitrite: NEGATIVE
Protein, ur: NEGATIVE mg/dL
Specific Gravity, Urine: 1.014 (ref 1.005–1.030)
pH: 5 (ref 5.0–8.0)

## 2021-10-07 LAB — SARS CORONAVIRUS 2 (TAT 6-24 HRS): SARS Coronavirus 2: NEGATIVE

## 2021-10-07 LAB — WET PREP, GENITAL
Clue Cells Wet Prep HPF POC: NONE SEEN
Sperm: NONE SEEN
Trich, Wet Prep: NONE SEEN
WBC, Wet Prep HPF POC: <10 (ref <10)
Yeast Wet Prep HPF POC: NONE SEEN

## 2021-10-07 MED ORDER — ACETAMINOPHEN 500 MG PO TABS
1000.0000 mg | ORAL_TABLET | Freq: Once | ORAL | Status: AC
  Administered 2021-10-07: 19:00:00 1000 mg via ORAL
  Filled 2021-10-07: qty 2

## 2021-10-07 MED ORDER — BUDESONIDE 90 MCG/ACT IN AEPB
1.0000 | INHALATION_SPRAY | Freq: Two times a day (BID) | RESPIRATORY_TRACT | 0 refills | Status: DC
  Filled 2021-10-07: qty 1, 30d supply, fill #0

## 2021-10-07 NOTE — ED Triage Notes (Signed)
She had a cobvic test yesterday that was negative

## 2021-10-07 NOTE — Discharge Instructions (Signed)

## 2021-10-07 NOTE — ED Provider Notes (Signed)
Emergency Medicine Provider Triage Evaluation Note  Natalie Aguilar , a 40 y.o. female  was evaluated in triage.  Pt complains of abdominal pain across the upper abdomen and lower central abdomen since 12:30 this afternoon, with associated n/v  Review of Systems  Positive: Abdominal pain,n, v, chills Negative: Fevers, vaginal bleeding  Physical Exam  BP 104/63    Pulse (!) 102    Temp 98.4 F (36.9 C)    Resp 18    Ht 5\' 5"  (1.651 m)    Wt 75.7 kg    LMP 06/19/2021    SpO2 97%    BMI 27.77 kg/m  Gen:   Awake, uncomfortable Resp:  Normal effort  MSK:   Moves extremities without difficulty  Other:  Generalized TTP of theabdomen,no CVAT  Medical Decision Making  Medically screening exam initiated at 4:21 PM.  Appropriate orders placed.  Kattaleya Karmen Stabs was informed that the remainder of the evaluation will be completed by another provider, this initial triage assessment does not replace that evaluation, and the importance of remaining in the ED until their evaluation is complete.  Case discussed with MAU provider, who is agreeable to accepting this patient in her department. I appreciate her collaboration in the care of this patient.  This chart was dictated using voice recognition software, Dragon. Despite the best efforts of this provider to proofread and correct errors, errors may still occur which can change documentation meaning.    Emeline Darling, PA-C 10/07/21 [place], [place], DO 10/08/21 [phone]

## 2021-10-07 NOTE — ED Triage Notes (Signed)
The ptis c/o abd painsince thisam she has vomited once.lmpsept  [redacted] weeks pregnant no vaginalbleeding atpresent this is her fourth pregnancy

## 2021-10-07 NOTE — MAU Note (Signed)
Natalie Aguilar is a 40 y.o. at [redacted]w[redacted]d here in MAU reporting: abdominal pain since 1230 this afternoon. No bleeding. Having yellow/brown discharge.  Onset of complaint: today  Pain score: 4/10  Vitals:   10/07/21 1605 10/07/21 1754  BP: 104/63 120/60  Pulse: (!) 102 73  Resp: 18 20  Temp: 98.4 F (36.9 C) 98.1 F (36.7 C)  SpO2: 97% 100%     FHT:148  Lab orders placed from triage: UA

## 2021-10-07 NOTE — MAU Note (Signed)
Pain starts in upper abd comes out to side and down in a circular pattern.  Comes and goes, is a squeezing /tightening.  Not as painful now.

## 2021-10-07 NOTE — MAU Provider Note (Signed)
History     CSN: [id number]  Arrival date and time: 10/07/21 1529  Event Date/Time  First Provider Initiated Contact with Patient 10/07/21 1902      Chief Complaint  Patient presents with   Abdominal Pain   Vaginal Discharge   HPI Natalie Aguilar is a 40 y.o. [phone] at [redacted]w[redacted]d who presents to MAU for upper abdominal pain that radiates down sides. Pain started around 1230pm today, is intermittent and is worsened with coughing. Patient was diagnosed with URI at Urgent Care yesterday, was given prescription for inhaler, however has not picked it up yet. Pain is currently minimal at this time. She is also reporting a thick, yellow/brown tinged discharge. She denies vaginal bleeding, urinary s/s, itching or odor. Reports a headache that she currently rates 7/10.   OB History     Gravida  4   Para  3   Term  3   Preterm      AB      Living  3      SAB      IAB      Ectopic      Multiple      Live Births  3           Past Medical History:  Diagnosis Date   Depression    Pregnancy induced hypertension    first preg   UTI (urinary tract infection)     Past Surgical History:  Procedure Laterality Date   COLONOSCOPY  07/22/2020   NO PAST SURGERIES      Family History  Problem Relation Age of Onset   Hypertension Mother    Diabetes Mother    Thyroid disease Mother    Fibroids Mother    HIV/AIDS Father    Fibroids Sister    Colon cancer Neg Hx    Esophageal cancer Neg Hx    Rectal cancer Neg Hx    Stomach cancer Neg Hx     Social History   Tobacco Use   Smoking status: Never   Smokeless tobacco: Never  Vaping Use   Vaping Use: Never used  Substance Use Topics   Alcohol use: Not Currently    Comment: occasionally    Drug use: No    Allergies: No Known Allergies  No medications prior to admission.    Review of Systems  Constitutional: Negative.   Respiratory:  Positive for cough.   Cardiovascular: Negative.   Gastrointestinal:   Positive for abdominal pain and nausea. Negative for diarrhea and vomiting.  Genitourinary:  Positive for vaginal discharge. Negative for dysuria and vaginal bleeding.  Musculoskeletal: Negative.   Neurological:  Positive for headaches.   Physical Exam   Blood pressure 111/63, pulse 62, temperature 98.1 F (36.7 C), temperature source Oral, resp. rate 20, height 5\' 5"  (1.651 m), weight 74.8 kg, last menstrual period 06/19/2021, SpO2 100 %.  Physical Exam Vitals and nursing note reviewed.  Constitutional:      General: She is not in acute distress. Cardiovascular:     Rate and Rhythm: Normal rate.  Pulmonary:     Effort: Pulmonary effort is normal.  Abdominal:     Palpations: Abdomen is soft.     Tenderness: There is no abdominal tenderness.  Genitourinary:    Comments: Blind swabs obtained  Skin:    General: Skin is warm and dry.  Neurological:     General: No focal deficit present.     Mental Status: She is alert and oriented to  person, place, and time.  Psychiatric:        Mood and Affect: Mood normal.        Behavior: Behavior normal.   FHT: 148 bpm via doppler MAU Course  Procedures UA Wet prep, GC/CT collected Tylenol 1000mg  PO  MDM UA and wet prep unremarkable. Pain improved with Tylenol. Pain likely MSK related given aggravated by coughing.  Spanish interpretor, [place], used during today's visit  Assessment and Plan  [redacted] weeks gestation of pregnancy Abdominal pain affecting pregnancy  - Discharge home in stable condition - Increase hydration and pick up inhaler as previously prescribed. May continue Tylenol or use heat/ice prn. - List of safe meds in pregnancy provided - Keep OB appt as scheduled on 10/24/2021 - Return to MAU as needed or for worsening symptoms    Renee Harder, CNM 10/07/2021, 8:44 PM

## 2021-10-07 NOTE — Telephone Encounter (Signed)
Patient called into front office stating she went to the ER yesterday and was given a Rx for an inhaler but it is too expensive. Patient wants to know if we can send to [place]. Rx for pulmicort sent there & patient informed. Patient asked what she could take for a cough. Recommended robitussin or delsym per protocol. Patient verbalized understanding. Eda assisted with spanish interpretation.

## 2021-10-08 ENCOUNTER — Other Ambulatory Visit: Payer: Self-pay

## 2021-10-08 LAB — GC/CHLAMYDIA PROBE AMP (~~LOC~~) NOT AT ARMC
Chlamydia: NEGATIVE
Comment: NEGATIVE
Comment: NORMAL
Neisseria Gonorrhea: NEGATIVE

## 2021-10-11 NOTE — L&D Delivery Note (Addendum)
OB/GYN Faculty Practice Delivery Note  Natalie Aguilar is a 41 y.o. [phone] s/p SVD at [redacted]w[redacted]d She was admitted for IOL for AMA.   ROM: 1h 021mith clear fluid GBS Status: positive, treated adequately with penicillin Maximum Maternal Temperature: 98.3  Labor Progress: IOL started with cytotec x2 which got her to 4cm. Attempted to AROM but baby was still too high so started Pitocin. Pt progressed to 6cm, AROM completed and pt was complete with urge to push within 3073m  Delivery Date/Time: 03/19/22 at 1947 Delivery: Called to room and patient was complete and pushing. Head delivered ROA. Double nuchal cord present. Shoulder and body delivered using somersault maneuver to reduce cord. Infant with spontaneous cry, placed on mother's abdomen, dried and stimulated. Cord clamped x 2 after 1-minute delay, and cut by FOB. Cord blood drawn and cord sample collected. Placenta delivered spontaneously, intact, with 3-vessel cord. Fundus firm with massage and Pitocin. Labia, perineum, vagina, and cervix inspected, no laceration.  Placenta: Spontaneous, to L&D for disposal Complications: None Lacerations: None EBL: 150 Analgesia: None  Postpartum Planning '[x]'$  transfer orders to MB '[x]'$  discharge summary started & shared '[x]'$  message to sent to schedule follow-up  '[x]'$  lists updated '[x]'$  vaccines UTD  Infant: Girl  APGARs 9/9  2970g  JamGaylan GeroldNMRichlandBCLodirtified Nurse Midwife, FacCasa [facility]outh[facility] WomDean [facility] CompanyonZephyrh[facility] North9/2023, 8:42 PM

## 2021-10-21 ENCOUNTER — Encounter: Payer: Self-pay | Admitting: Obstetrics and Gynecology

## 2021-10-22 ENCOUNTER — Other Ambulatory Visit: Payer: Self-pay

## 2021-10-22 ENCOUNTER — Ambulatory Visit (INDEPENDENT_AMBULATORY_CARE_PROVIDER_SITE_OTHER): Payer: Self-pay | Admitting: Obstetrics and Gynecology

## 2021-10-22 VITALS — BP 107/68 | HR 76 | Wt 166.8 lb

## 2021-10-22 DIAGNOSIS — O09299 Supervision of pregnancy with other poor reproductive or obstetric history, unspecified trimester: Secondary | ICD-10-CM

## 2021-10-22 DIAGNOSIS — Z3A17 17 weeks gestation of pregnancy: Secondary | ICD-10-CM

## 2021-10-22 DIAGNOSIS — O09522 Supervision of elderly multigravida, second trimester: Secondary | ICD-10-CM

## 2021-10-22 DIAGNOSIS — O0990 Supervision of high risk pregnancy, unspecified, unspecified trimester: Secondary | ICD-10-CM

## 2021-10-22 DIAGNOSIS — Z789 Other specified health status: Secondary | ICD-10-CM | POA: Insufficient documentation

## 2021-10-22 DIAGNOSIS — R87612 Low grade squamous intraepithelial lesion on cytologic smear of cervix (LGSIL): Secondary | ICD-10-CM

## 2021-10-22 MED ORDER — FAMOTIDINE 20 MG PO TABS
20.0000 mg | ORAL_TABLET | Freq: Two times a day (BID) | ORAL | 3 refills | Status: DC
  Filled 2021-10-22 – 2021-10-30 (×2): qty 60, 30d supply, fill #0
  Filled 2021-12-30 – 2022-01-11 (×3): qty 60, 30d supply, fill #1

## 2021-10-22 NOTE — Progress Notes (Signed)
° °  PRENATAL VISIT NOTE  Subjective:  Natalie Aguilar is a 41 y.o. [phone] at [redacted]w[redacted]d being seen today for ongoing prenatal care.  She is currently monitored for the following issues for this high-risk pregnancy and has Menorrhagia with irregular cycle; LGSIL on Pap smear of cervix; History of pre-eclampsia in prior pregnancy, currently pregnant; Advanced maternal age in multigravida; and Supervision of high risk pregnancy, antepartum on their problem list.  Patient reports no complaints.  Contractions: Not present. Vag. Bleeding: None.  Movement: Absent. Denies leaking of fluid.   The following portions of the patient's history were reviewed and updated as appropriate: allergies, current medications, past family history, past medical history, past social history, past surgical history and problem list.   Objective:   Vitals:   10/22/21 1538  BP: 107/68  Pulse: 76  Weight: 166 lb 12.8 oz (75.7 kg)    Fetal Status: Fetal Heart Rate (bpm): 140   Movement: Absent     General:  Alert, oriented and cooperative. Patient is in no acute distress.  Skin: Skin is warm and dry. No rash noted.   Cardiovascular: Normal heart rate noted  Respiratory: Normal respiratory effort, no problems with respiration noted  Abdomen: Soft, gravid, appropriate for gestational age.  Pain/Pressure: Present     Pelvic: Cervical exam deferred        Extremities: Normal range of motion.  Edema: None  Mental Status: Normal mood and affect. Normal behavior. Normal judgment and thought content.   Assessment and Plan:  Pregnancy: [id number] at [redacted]w[redacted]d 1. [redacted] weeks gestation of pregnancy Mfm anatomy u/s next week.  - AFP, Serum, Open Spina Bifida  2. Multigravida of advanced maternal age in second trimester Low risk panorama.  - AFP, Serum, Open Spina Bifida  3. History of pre-eclampsia in prior pregnancy, currently pregnant Confirms on low dose asa  Interpreter used  Preterm labor symptoms and general obstetric  precautions including but not limited to vaginal bleeding, contractions, leaking of fluid and fetal movement were reviewed in detail with the patient. Please refer to After Visit Summary for other counseling recommendations.   Return in about 3 weeks (around 11/12/2021) for in person, md or app, low risk ob.  Future Appointments  Date Time Provider [place]  10/30/2021  8:30 AM [facility] NURSE Terena Bohan [facility]  10/30/2021  8:45 AM [facility]    Aletha Halim, MD

## 2021-10-24 LAB — AFP, SERUM, OPEN SPINA BIFIDA
AFP MoM: 0.67
AFP Value: 27.1 ng/mL
Gest. Age on Collection Date: 17.6 weeks
Maternal Age At EDD: 41.4 yr
OSBR Risk 1 IN: 10000
Test Results:: NEGATIVE
Weight: 167 [lb_av]

## 2021-10-29 ENCOUNTER — Other Ambulatory Visit: Payer: Self-pay

## 2021-10-30 ENCOUNTER — Other Ambulatory Visit: Payer: Self-pay | Admitting: *Deleted

## 2021-10-30 ENCOUNTER — Encounter: Payer: Self-pay | Admitting: *Deleted

## 2021-10-30 ENCOUNTER — Ambulatory Visit: Payer: No Typology Code available for payment source | Attending: Nurse Practitioner

## 2021-10-30 ENCOUNTER — Ambulatory Visit: Payer: No Typology Code available for payment source | Admitting: *Deleted

## 2021-10-30 ENCOUNTER — Other Ambulatory Visit: Payer: Self-pay

## 2021-10-30 VITALS — BP 114/57 | HR 55

## 2021-10-30 DIAGNOSIS — O09522 Supervision of elderly multigravida, second trimester: Secondary | ICD-10-CM | POA: Insufficient documentation

## 2021-10-30 DIAGNOSIS — O0990 Supervision of high risk pregnancy, unspecified, unspecified trimester: Secondary | ICD-10-CM

## 2021-10-30 DIAGNOSIS — O43199 Other malformation of placenta, unspecified trimester: Secondary | ICD-10-CM

## 2021-10-30 DIAGNOSIS — O09299 Supervision of pregnancy with other poor reproductive or obstetric history, unspecified trimester: Secondary | ICD-10-CM

## 2021-11-03 ENCOUNTER — Encounter: Payer: Self-pay | Admitting: Nurse Practitioner

## 2021-11-03 DIAGNOSIS — O43199 Other malformation of placenta, unspecified trimester: Secondary | ICD-10-CM | POA: Insufficient documentation

## 2021-11-19 ENCOUNTER — Ambulatory Visit (INDEPENDENT_AMBULATORY_CARE_PROVIDER_SITE_OTHER): Payer: Self-pay | Admitting: Obstetrics & Gynecology

## 2021-11-19 ENCOUNTER — Other Ambulatory Visit: Payer: Self-pay

## 2021-11-19 VITALS — BP 107/61 | HR 62 | Wt 174.0 lb

## 2021-11-19 DIAGNOSIS — I499 Cardiac arrhythmia, unspecified: Secondary | ICD-10-CM

## 2021-11-19 DIAGNOSIS — O0990 Supervision of high risk pregnancy, unspecified, unspecified trimester: Secondary | ICD-10-CM

## 2021-11-19 DIAGNOSIS — O09299 Supervision of pregnancy with other poor reproductive or obstetric history, unspecified trimester: Secondary | ICD-10-CM

## 2021-11-19 DIAGNOSIS — O09522 Supervision of elderly multigravida, second trimester: Secondary | ICD-10-CM

## 2021-11-19 DIAGNOSIS — O43199 Other malformation of placenta, unspecified trimester: Secondary | ICD-10-CM

## 2021-11-19 NOTE — Progress Notes (Signed)
Patient expressed that her chest and back "feeling tired" Patient stated that she at times, need to take a "deep breath"

## 2021-11-19 NOTE — Progress Notes (Signed)
° °  PRENATAL VISIT NOTE  Subjective:  Natalie Aguilar is a 41 y.o. [phone] at [redacted]w[redacted]d being seen today for ongoing prenatal care.  She is currently monitored for the following issues for this high-risk pregnancy and has Menorrhagia with irregular cycle; LGSIL on Pap smear of cervix; History of pre-eclampsia in prior pregnancy, currently pregnant; Advanced maternal age in multigravida; Supervision of high risk pregnancy, antepartum; Language barrier; and Marginal insertion of umbilical cord affecting management of mother on their problem list.  Patient reports  SOB and rapid heart rate episodes with fatigue .  Contractions: Not present. Vag. Bleeding: None.  Movement: Present. Denies leaking of fluid.   The following portions of the patient's history were reviewed and updated as appropriate: allergies, current medications, past family history, past medical history, past social history, past surgical history and problem list.   Objective:   Vitals:   11/19/21 1535  BP: 107/61  Pulse: 62  Weight: 174 lb (78.9 kg)    Fetal Status: Fetal Heart Rate (bpm): 150   Movement: Present     General:  Alert, oriented and cooperative. Patient is in no acute distress.  Skin: Skin is warm and dry. No rash noted.   Cardiovascular: Normal heart rate noted, nl heart sounds  Respiratory: Normal respiratory effort, no problems with respiration noted, nl breath sounds  Abdomen: Soft, gravid, appropriate for gestational age.  Pain/Pressure: Absent     Pelvic: Cervical exam deferred        Extremities: Normal range of motion.     Mental Status: Normal mood and affect. Normal behavior. Normal judgment and thought content.   Assessment and Plan:  Pregnancy: [id number] at [redacted]w[redacted]d 1. Marginal insertion of umbilical cord affecting management of mother Discussed f/u US fpor   2. Supervision of high risk pregnancy, antepartum Cardiac/chest sx  3. History of pre-eclampsia in prior pregnancy, currently  pregnant normotensive  4. Multigravida of advanced maternal age in second trimester   5. Irregular heart rate Normal exam, may need cardiac monitor for possible arrhythmia - AMB Referral to Cardio Obstetrics  Preterm labor symptoms and general obstetric precautions including but not limited to vaginal bleeding, contractions, leaking of fluid and fetal movement were reviewed in detail with the patient. Please refer to After Visit Summary for other counseling recommendations.   Return in about 4 weeks (around 12/17/2021).  Future Appointments  Date Time Provider [place]  12/04/2021 10:30 AM [facility] NURSE [facility]  12/04/2021 10:45 AM [facility] US6 [facility]    Emeterio Reeve, MD

## 2021-11-25 NOTE — Progress Notes (Deleted)
Cardio-Obstetrics Clinic  New Evaluation  Date:  11/26/2021   ID:  Natalie Aguilar, DOB 10-25-1980, MRN [id number]  PCP:  Gildardo Pounds, NP   [facility] Providers Cardiologist:  None  Electrophysiologist:  None   {   Referring MD: Woodroe Mode, MD   Chief Complaint: Palpitations  History of Present Illness:    Natalie Aguilar is a 41 y.o. female [[id number]] who is being seen today for the evaluation of palpitations at the request of Woodroe Mode, MD.   Patient has history of preeclampsia.  Today, ***  Prior CV Studies Reviewed: The following studies were reviewed today: ***  Past Medical History:  Diagnosis Date   Depression    Pregnancy induced hypertension    first preg   UTI (urinary tract infection)     Past Surgical History:  Procedure Laterality Date   COLONOSCOPY  07/22/2020   NO PAST SURGERIES     { Click here to update PMH, PSH, OB Hx then refresh note  :1}   OB History     Gravida  4   Para  3   Term  3   Preterm      AB      Living  3      SAB      IAB      Ectopic      Multiple      Live Births  3           { Click here to update OB Charting then refresh note  :1}    Current Medications: No outpatient medications have been marked as taking for the 11/27/21 encounter (Appointment) with Freada Bergeron, MD.     Allergies:   Patient has no known allergies.   Social History   Socioeconomic History   Marital status: Single    Spouse name: Not on file   Number of children: 3   Years of education: Not on file   Highest education level: 10th grade  Occupational History   Not on file  Tobacco Use   Smoking status: Never   Smokeless tobacco: Never  Vaping Use   Vaping Use: Never used  Substance and Sexual Activity   Alcohol use: Not Currently    Comment: occasionally    Drug use: No   Sexual activity: Yes    Birth control/protection: Condom  Other Topics Concern   Not on file  Social  History Narrative   Not on file   Social Determinants of Health   Financial Resource Strain: Not on file  Food Insecurity: No Food Insecurity   Worried About Running Out of Food in the Last Year: Never true   [place] in the Last Year: Never true  Transportation Needs: No Transportation Needs   Lack of Transportation (Medical): No   Lack of Transportation (Non-Medical): No  Physical Activity: Not on file  Stress: Not on file  Social Connections: Not on file  { Click here to update SDOH then refresh :1}    Family History  Problem Relation Age of Onset   Hypertension Mother    Diabetes Mother    Thyroid disease Mother    Fibroids Mother    HIV/AIDS Father    Fibroids Sister    Colon cancer Neg Hx    Esophageal cancer Neg Hx    Rectal cancer Neg Hx    Stomach cancer Neg Hx    { Click here to update  FH then refresh note    :1}   ROS:   Please see the history of present illness.    *** All other systems reviewed and are negative.   Labs/EKG Reviewed:    EKG:   EKG is *** ordered today.  The ekg ordered today demonstrates ***  Recent Labs: 06/25/2021: TSH 1.020 08/27/2021: ALT 12; BUN 10; Creatinine, Ser 0.63; Hemoglobin 12.7; Platelets 227; Potassium 3.8; Sodium 139   Recent Lipid Panel No results found for: CHOL, TRIG, HDL, CHOLHDL, LDLCALC, LDLDIRECT  Physical Exam:    VS:  LMP 06/19/2021     Wt Readings from Last 3 Encounters:  11/19/21 174 lb (78.9 kg)  10/22/21 166 lb 12.8 oz (75.7 kg)  10/07/21 164 lb 14.4 oz (74.8 kg)     GEN: *** Well nourished, well developed in no acute distress HEENT: Normal NECK: No JVD; No carotid bruits LYMPHATICS: No lymphadenopathy CARDIAC: ***RRR, no murmurs, rubs, gallops RESPIRATORY:  Clear to auscultation without rales, wheezing or rhonchi  ABDOMEN: Soft, non-tender, non-distended MUSCULOSKELETAL:  No edema; No deformity  SKIN: Warm and dry NEUROLOGIC:  Alert and oriented x 3 PSYCHIATRIC:  Normal affect     Risk Assessment/Risk Calculators:   { Click to calculate CARPREG II - THEN refresh note :1}    { Click to caclulate Mod WHO Class of CV Risk - THEN refresh note :1}     { Click for [id number] Score - THEN Refresh Note    :[id number]}      ASSESSMENT & PLAN:    #Palpitations: -Check zio monitor There are no Patient Instructions on file for this visit.   Dispo:  No follow-ups on file.   Medication Adjustments/Labs and Tests Ordered: Current medicines are reviewed at length with the patient today.  Concerns regarding medicines are outlined above.  Tests Ordered: No orders of the defined types were placed in this encounter.  Medication Changes: No orders of the defined types were placed in this encounter.

## 2021-11-27 ENCOUNTER — Encounter: Payer: Self-pay | Admitting: Cardiology

## 2021-11-27 ENCOUNTER — Ambulatory Visit (INDEPENDENT_AMBULATORY_CARE_PROVIDER_SITE_OTHER): Payer: Self-pay

## 2021-11-27 ENCOUNTER — Ambulatory Visit (INDEPENDENT_AMBULATORY_CARE_PROVIDER_SITE_OTHER): Payer: Self-pay | Admitting: Cardiology

## 2021-11-27 ENCOUNTER — Other Ambulatory Visit: Payer: Self-pay

## 2021-11-27 VITALS — BP 120/72 | HR 68 | Ht 65.0 in | Wt 177.9 lb

## 2021-11-27 DIAGNOSIS — R040 Epistaxis: Secondary | ICD-10-CM

## 2021-11-27 DIAGNOSIS — R002 Palpitations: Secondary | ICD-10-CM

## 2021-11-27 DIAGNOSIS — R011 Cardiac murmur, unspecified: Secondary | ICD-10-CM

## 2021-11-27 DIAGNOSIS — R079 Chest pain, unspecified: Secondary | ICD-10-CM

## 2021-11-27 NOTE — Patient Instructions (Signed)
Medication Instructions:   Your physician recommends that you continue on your current medications as directed. Please refer to the Current Medication list given to you today.  *If you need a refill on your cardiac medications before your next appointment, please call your pharmacy*   Lab Work:  SAME DAY AS ECHO IS [place] CBC AT THAT TIME.  If you have labs (blood work) drawn today and your tests are completely normal, you will receive your results only by: [place] (if you have MyChart) OR A paper copy in the mail If you have any lab test that is abnormal or we need to change your treatment, we will call you to review the results.   Testing/Procedures:  Your physician has requested that you have an echocardiogram. Echocardiography is a painless test that uses sound waves to create images of your heart. It provides your doctor with information about the size and shape of your heart and how well your hearts chambers and valves are working. This procedure takes approximately one hour. There are no restrictions for this procedure. Cardiac-OB patient: to be performed by [facility] or [facility].    ZIO XT- Long Term Monitor Instructions  Your physician has requested you wear a ZIO patch monitor for 14 days.  This is a single patch monitor. [employer] supplies one patch monitor per enrollment. Additional stickers are not available. Please do not apply patch if you will be having a Nuclear Stress Test,  Echocardiogram, Cardiac CT, MRI, or Chest Xray during the period you would be wearing the  monitor. The patch cannot be worn during these tests. You cannot remove and re-apply the  ZIO XT patch monitor.  Your ZIO patch monitor will be mailed 3 day USPS to your address on file. It may take 3-5 days  to receive your monitor after you have been enrolled.  Once you have received your monitor, please review the enclosed instructions. Your monitor  has already been registered assigning a  specific monitor serial # to you.  Billing and Patient Assistance Program Information  We have supplied [employer] with any of your insurance information on file for billing purposes. [employer] offers a sliding scale Patient Assistance Program for patients that do not have  insurance, or whose insurance does not completely cover the cost of the ZIO monitor.  You must apply for the Patient Assistance Program to qualify for this discounted rate.  To apply, please call [employer] at [phone], select option 4, select option 2, ask to apply for  Patient Assistance Program. Theodore Demark will ask your household income, and how many people  are in your household. They will quote your out-of-pocket cost based on that information.  [employer] will also be able to set up a 52-month, interest-free payment plan if needed.  Applying the monitor   Shave hair from upper left chest.  Hold abrader disc by orange tab. Rub abrader in 40 strokes over the upper left chest as  indicated in your monitor instructions.  Clean area with 4 enclosed alcohol pads. Let dry.  Apply patch as indicated in monitor instructions. Patch will be placed under collarbone on left  side of chest with arrow pointing upward.  Rub patch adhesive wings for 2 minutes. Remove white label marked "1". Remove the white  label marked "2". Rub patch adhesive wings for 2 additional minutes.  While looking in a mirror, press and release button in center of patch. A small green light will  flash 3-4 times. This will be your  only indicator that the monitor has been turned on.  Do not shower for the first 24 hours. You may shower after the first 24 hours.  Press the button if you feel a symptom. You will hear a small click. Record Date, Time and  Symptom in the Patient Logbook.  When you are ready to remove the patch, follow instructions on the last 2 pages of Patient  Logbook. Stick patch monitor onto the last page of Patient Logbook.  Place Patient  Logbook in the blue and white box. Use locking tab on box and tape box closed  securely. The blue and white box has prepaid postage on it. Please place it in the mailbox as  soon as possible. Your physician should have your test results approximately 7 days after the  monitor has been mailed back to [facility].  Call [place] at [phone] if you have questions regarding  your ZIO XT patch monitor. Call them immediately if you see an orange light blinking on your  monitor.  If your monitor falls off in less than 4 days, contact our Monitor department at [phone].  If your monitor becomes loose or falls off after 4 days call [employer] at [phone] for  suggestions on securing your monitor    Follow-Up:  [place] DO

## 2021-11-27 NOTE — Progress Notes (Signed)
Cardio-Obstetrics Clinic  New Evaluation  Date:  11/27/2021   ID:  Natalie Aguilar, DOB [birth year], MRN [id number]  PCP:  Gildardo Pounds, NP   [facility] Providers Cardiologist:  None  Electrophysiologist:  None   {   Referring MD: Woodroe Mode, MD   Chief Complaint: Palpitations  History of Present Illness:    Natalie Aguilar is a 41 y.o. female [[id number]] who is being seen today for the evaluation of palpitations at the request of Woodroe Mode, MD.   Patient has history of preeclampsia.  Today, patient reports that she has had intermittent episodes of feeling like her heart is racing which typically occur every week to 2 weeks.  She also feels a constant "chest fatigue".  Denies any chest pain.  Reports that the last occurrence of this rapid heart rate was 1.5 weeks ago.  Denies any other symptoms like shortness of breath, headaches, changes in vision, right upper quadrant pain.  Patient denies any past medical history of heart issues.  Denies any known family history of cardiac issues.  Does acknowledge that she does not know most of her family and only knows her parents.  Another concern that the patient has is that she has been having bloody noses daily for the past week.  Sunday she will have 2 bloody noses.  Reports that before that she may have 1-2 a week for several weeks but it has dramatically increased.  Denies any trauma to the nose.  Reports that her house is heated by electrocuted in the years but dry.  Denies any other rhinorrhea, congestion.  Prior CV Studies Reviewed: The following studies were reviewed today: No prior CV studies  Past Medical History:  Diagnosis Date   Depression    Irregular heart rate    Pregnancy induced hypertension    first preg   UTI (urinary tract infection)     Past Surgical History:  Procedure Laterality Date   COLONOSCOPY  07/22/2020   NO PAST SURGERIES        OB History     Gravida  4   Para  3   Term   3   Preterm      AB      Living  3      SAB      IAB      Ectopic      Multiple      Live Births  3               Current Medications: Current Meds  Medication Sig   aspirin EC 81 MG tablet Take 1 tablet (81 mg total) by mouth daily. Take after 12 weeks for prevention of preeclampsia later in pregnancy   Prenatal Vit-Fe Fumarate-FA (PRENATAL VITAMIN PO) Take by mouth.     Allergies:   Patient has no known allergies.   Social History   Socioeconomic History   Marital status: Single    Spouse name: Not on file   Number of children: 3   Years of education: Not on file   Highest education level: 10th grade  Occupational History   Not on file  Tobacco Use   Smoking status: Never   Smokeless tobacco: Never  Vaping Use   Vaping Use: Never used  Substance and Sexual Activity   Alcohol use: Not Currently    Comment: occasionally    Drug use: No   Sexual activity: Yes    Birth control/protection: Condom  Other Topics Concern   Not on file  Social History Narrative   Not on file   Social Determinants of Health   Financial Resource Strain: Not on file  Food Insecurity: No Food Insecurity   Worried About Running Out of Food in the Last Year: Never true   Ran Out of Food in the Last Year: Never true  Transportation Needs: No Transportation Needs   Lack of Transportation (Medical): No   Lack of Transportation (Non-Medical): No  Physical Activity: Not on file  Stress: Not on file  Social Connections: Not on file      Family History  Problem Relation Age of Onset   Hypertension Mother    Diabetes Mother    Thyroid disease Mother    Fibroids Mother    HIV/AIDS Father    Fibroids Sister    Colon cancer Neg Hx    Esophageal cancer Neg Hx    Rectal cancer Neg Hx    Stomach cancer Neg Hx       ROS:   Please see the history of present illness.     All other systems reviewed and are negative.   Labs/EKG Reviewed:    EKG:   EKG is ordered  today.  The ekg ordered today demonstrates normal sinus rhythm with rate of 68 bpm  Recent Labs: 06/25/2021: TSH 1.020 08/27/2021: ALT 12; BUN 10; Creatinine, Ser 0.63; Hemoglobin 12.7; Platelets 227; Potassium 3.8; Sodium 139   Recent Lipid Panel No results found for: CHOL, TRIG, HDL, CHOLHDL, LDLCALC, LDLDIRECT  Physical Exam:    VS:  BP 120/72    Pulse 68    Ht 5\' 5"  (1.651 m)    Wt 177 lb 14.4 oz (80.7 kg)    LMP 06/19/2021    BMI 29.60 kg/m     Wt Readings from Last 3 Encounters:  11/27/21 177 lb 14.4 oz (80.7 kg)  11/19/21 174 lb (78.9 kg)  10/22/21 166 lb 12.8 oz (75.7 kg)     GEN: Well-appearing 41 year old female in no acute distress HEENT: Moist mucous membranes, visible source of the bleeding on the left nasal passage, considerable erythema of nasal turbinates bilaterally NECK: No JVD; No carotid bruits LYMPHATICS: No lymphadenopathy CARDIAC: Regular rate and rhythm, 2/6 systolic murmur heard at right lower sternal border RESPIRATORY: Lungs are clear to auscultation bilaterally, normal work of breathing, speaking in full sentences ABDOMEN: Soft, nontender, gravid MUSCULOSKELETAL: No lower extremity edema appreciated SKIN: Warm, dry NEUROLOGIC:  Alert and oriented x 3 PSYCHIATRIC:  Normal affect    Risk Assessment/Risk Calculators:     CARPREG II Risk Prediction Index Score:  1.  The patient's risk for a primary cardiac event is 5%.            ASSESSMENT & PLAN:    #Palpitations #Heart Murmur  Unclear etiology for patient's heart palpitations.  Could be related to anemia or abnormal heart structure given murmur. Heart murmur most likely flow murmur of pregnancy.  2/6 left and right sternal borders.  No lower extremity edema appreciated. - We will obtain echocardiogram -14-day ZIO monitor ordered - Follow-up in 6 weeks  #Recurrent epistaxis Most likely related to dry air 2/2 electric heat in patient's house as well as aspirin use.  It has become  considerably more frequent so will evaluate with CBC.  Discussed saline nasal spray with patient. - CBC ordered but will have to be obtained on 11/30/21 -Encourage patient to use nasal saline   There are no Patient  Instructions on file for this visit.   Dispo:  No follow-ups on file.   Medication Adjustments/Labs and Tests Ordered: Current medicines are reviewed at length with the patient today.  Concerns regarding medicines are outlined above.  Tests Ordered: Orders Placed This Encounter  Procedures   CBC   LONG TERM MONITOR (3-14 DAYS)   EKG 12-Lead   ECHOCARDIOGRAM COMPLETE   Medication Changes: No orders of the defined types were placed in this encounter.

## 2021-11-27 NOTE — Progress Notes (Unsigned)
Enrolled pt for 14 day zio xt to be mailed to home address.

## 2021-12-04 ENCOUNTER — Ambulatory Visit: Payer: Self-pay | Attending: Obstetrics and Gynecology

## 2021-12-04 ENCOUNTER — Other Ambulatory Visit: Payer: Self-pay

## 2021-12-04 ENCOUNTER — Encounter: Payer: Self-pay | Admitting: *Deleted

## 2021-12-04 ENCOUNTER — Ambulatory Visit: Payer: Self-pay | Admitting: *Deleted

## 2021-12-04 ENCOUNTER — Other Ambulatory Visit: Payer: Self-pay | Admitting: *Deleted

## 2021-12-04 VITALS — BP 120/54 | HR 70

## 2021-12-04 DIAGNOSIS — O09292 Supervision of pregnancy with other poor reproductive or obstetric history, second trimester: Secondary | ICD-10-CM

## 2021-12-04 DIAGNOSIS — O43192 Other malformation of placenta, second trimester: Secondary | ICD-10-CM

## 2021-12-04 DIAGNOSIS — O09299 Supervision of pregnancy with other poor reproductive or obstetric history, unspecified trimester: Secondary | ICD-10-CM | POA: Insufficient documentation

## 2021-12-04 DIAGNOSIS — R002 Palpitations: Secondary | ICD-10-CM

## 2021-12-04 DIAGNOSIS — Z3A24 24 weeks gestation of pregnancy: Secondary | ICD-10-CM

## 2021-12-04 DIAGNOSIS — O43199 Other malformation of placenta, unspecified trimester: Secondary | ICD-10-CM | POA: Insufficient documentation

## 2021-12-04 DIAGNOSIS — O09522 Supervision of elderly multigravida, second trimester: Secondary | ICD-10-CM

## 2021-12-13 ENCOUNTER — Inpatient Hospital Stay (HOSPITAL_COMMUNITY)
Admission: AD | Admit: 2021-12-13 | Discharge: 2021-12-14 | Disposition: A | Discharge status: Home / Self Care | Payer: Self-pay | Attending: Obstetrics and Gynecology | Admitting: Obstetrics and Gynecology

## 2021-12-13 ENCOUNTER — Encounter (HOSPITAL_COMMUNITY): Payer: Self-pay | Admitting: Obstetrics and Gynecology

## 2021-12-13 DIAGNOSIS — O43192 Other malformation of placenta, second trimester: Secondary | ICD-10-CM | POA: Insufficient documentation

## 2021-12-13 DIAGNOSIS — O282 Abnormal cytological finding on antenatal screening of mother: Secondary | ICD-10-CM | POA: Insufficient documentation

## 2021-12-13 DIAGNOSIS — Z3689 Encounter for other specified antenatal screening: Secondary | ICD-10-CM | POA: Insufficient documentation

## 2021-12-13 DIAGNOSIS — Z3A25 25 weeks gestation of pregnancy: Secondary | ICD-10-CM | POA: Insufficient documentation

## 2021-12-13 DIAGNOSIS — O468X2 Other antepartum hemorrhage, second trimester: Secondary | ICD-10-CM | POA: Insufficient documentation

## 2021-12-13 DIAGNOSIS — O26892 Other specified pregnancy related conditions, second trimester: Secondary | ICD-10-CM | POA: Insufficient documentation

## 2021-12-13 DIAGNOSIS — N930 Postcoital and contact bleeding: Secondary | ICD-10-CM

## 2021-12-13 DIAGNOSIS — O09522 Supervision of elderly multigravida, second trimester: Secondary | ICD-10-CM | POA: Insufficient documentation

## 2021-12-13 DIAGNOSIS — R1030 Lower abdominal pain, unspecified: Secondary | ICD-10-CM | POA: Insufficient documentation

## 2021-12-13 NOTE — MAU Note (Signed)
..  Natalie Aguilar is a 41 y.o. at 77w2dhere in MAU reporting: red VB starting 2230 around with a light flow period. Pt wearing a pad. Pt reports having intercourse with partner and pt started having discomfort and burning and she wiped and it was bleeding. Pt denies abnormal discharge, LOF, DFM, cramping, PIH s/s, and complication in the pregnancy.  ? ?Onset of complaint: 2230 ?Pain score: 0/10 ?Vitals:  ? 12/13/21 2332  ?BP: 115/66  ?Pulse: 66  ?Resp: 18  ?Temp: 97.7 ?F (36.5 ?C)  ?SpO2: 100%  ?   ?FHT:145 ?Lab orders placed from triage:  UA ? ?

## 2021-12-13 NOTE — MAU Provider Note (Signed)
?History  ?  ? ?CSN: [id number] ? ?Arrival date and time: 12/13/21 2319 ? ? None  ?  ? ?Chief Complaint  ?Patient presents with  ? Vaginal Bleeding  ? ?HPI ?Natalie Aguilar is a 41 y.o. [phone] at 46w2dwho presents to MAU for vaginal bleeding. Patient reports she and her significant other were having intercourse approximately 1 hour prior to arrival when she felt something wet. She wiped and noticed blood. She reports some mild lower abdominal crampy. She denies leaking fluid. Endorses active fetal movement.  ? ?[facility] Pregnancy has been complicated by AMA, marginal cord insertion, and LGSIL.  ? ?OB History   ? ? Gravida  ?4  ? Para  ?3  ? Term  ?3  ? Preterm  ?   ? AB  ?   ? Living  ?3  ?  ? ? SAB  ?   ? IAB  ?   ? Ectopic  ?   ? Multiple  ?   ? Live Births  ?3  ?   ?  ?  ? ? ?Past Medical History:  ?Diagnosis Date  ? Depression   ? Irregular heart rate   ? Pregnancy induced hypertension   ? first preg  ? UTI (urinary tract infection)   ? ? ?Past Surgical History:  ?Procedure Laterality Date  ? COLONOSCOPY  07/22/2020  ? NO PAST SURGERIES    ? ? ?Family History  ?Problem Relation Age of Onset  ? Hypertension Mother   ? Diabetes Mother   ? Thyroid disease Mother   ? Fibroids Mother   ? HIV/AIDS Father   ? Fibroids Sister   ? Colon cancer Neg Hx   ? Esophageal cancer Neg Hx   ? Rectal cancer Neg Hx   ? Stomach cancer Neg Hx   ? ? ?Social History  ? ?Tobacco Use  ? Smoking status: Never  ? Smokeless tobacco: Never  ?Vaping Use  ? Vaping Use: Never used  ?Substance Use Topics  ? Alcohol use: Not Currently  ?  Comment: occasionally   ? Drug use: No  ? ? ?Allergies: No Known Allergies ? ?Medications Prior to Admission  ?Medication Sig Dispense Refill Last Dose  ? aspirin EC 81 MG tablet Take 1 tablet (81 mg total) by mouth daily. Take after 12 weeks for prevention of preeclampsia later in pregnancy 300 tablet 2 12/12/2021  ? Prenatal Vit-Fe Fumarate-FA (PRENATAL VITAMIN PO) Take by mouth.    12/12/2021  ? Budesonide 90 MCG/ACT inhaler Inhale 1 puff into the lungs 2 (two) times daily. (Patient not taking: Reported on 11/27/2021) 1 each 0   ? famotidine (PEPCID) 20 MG tablet Take 1 tablet (20 mg total) by mouth 2 (two) times daily. (Patient not taking: Reported on 11/27/2021) 60 tablet 3   ? Iron, Ferrous Sulfate, 325 (65 Fe) MG TABS Take 325 mg by mouth daily. (Patient not taking: Reported on 11/27/2021) 100 tablet 1   ? Vitamin D, Ergocalciferol, (DRISDOL) 1.25 MG (50000 UNIT) CAPS capsule Take 1 capsule (50,000 Units total) by mouth every 7 (seven) days. (Patient not taking: Reported on 11/27/2021) 16 capsule 0   ? ? ?Review of Systems  ?Constitutional: Negative.   ?Respiratory: Negative.    ?Cardiovascular: Negative.   ?Gastrointestinal:  Positive for abdominal pain (cramping).  ?Genitourinary:  Positive for vaginal bleeding. Negative for dysuria and vaginal discharge.  ?Musculoskeletal: Negative.   ?Neurological: Negative.   ?Physical Exam  ? ?Blood  pressure 115/66, pulse 66, temperature 97.7 ?F (36.5 ?C), temperature source Oral, resp. rate 18, height '5\' 5"'$  (1.651 m), weight 81.9 kg, last menstrual period 06/19/2021, SpO2 100 %. ? ?Physical Exam ?Vitals and nursing note reviewed. Exam conducted with a chaperone present.  ?Constitutional:   ?   General: She is not in acute distress. ?Eyes:  ?   Extraocular Movements: Extraocular movements intact.  ?   Pupils: Pupils are equal, round, and reactive to light.  ?Cardiovascular:  ?   Rate and Rhythm: Normal rate.  ?Pulmonary:  ?   Effort: Pulmonary effort is normal.  ?Abdominal:  ?   Palpations: Abdomen is soft.  ?   Tenderness: There is no abdominal tenderness.  ?   Comments: gravid  ?Genitourinary: ?   Comments: NEFG, vaginal walls pink with rugae, small amount of dark red/brown mucous, no active bleeding from os, cervix visually closed, small nabothian cyst noted on cervix ?Musculoskeletal:     ?   General: Normal range of motion.  ?   Cervical back: Normal  range of motion.  ?Skin: ?   General: Skin is warm and dry.  ?Neurological:  ?   General: No focal deficit present.  ?   Mental Status: She is alert and oriented to person, place, and time.  ?Psychiatric:     ?   Mood and Affect: Mood normal.     ?   Behavior: Behavior normal.     ?   Thought Content: Thought content normal.     ?   Judgment: Judgment normal.  ? ?NST ?FHR: 140 bpm, moderate variability, +10x10 accels, 1 variable decel ?Toco: Q 3 mins ? ? ? ? ?MAU Course  ?Procedures ?UA ?Speculum exam, wet prep, GC/CT ?IVF bolus ?NST ? ?MDM ?Small amount of dark red/brown mucoid discharge. No active bleeding on spec exam. Prelim [place] reassuring. NST reassuring for gestational age. Toco with UC's Q 3 mins. IVF bolus initiated. Patient was offered Procardia, but declines. After IVF bolus, patient reports cramping has improved. No contractions on toco.  ?D/w Dr. Rip Harbour, no active bleeding, [place] reassuring, patient stable for discharge home.  ? ?Assessment and Plan  ?[redacted] weeks gestation of pregnancy ?Postcoital bleeding ? ?- Discharge home in stable condition ?- Pelvic rest ?- Strict return precautions. Return to MAU sooner or as needed for worsening symptoms ?- Keep OB appointment as scheduled on 3/9.  ? ? ? ?Renee Harder, CNM ?12/14/2021, 3:32 AM  ?

## 2021-12-14 ENCOUNTER — Inpatient Hospital Stay (HOSPITAL_BASED_OUTPATIENT_CLINIC_OR_DEPARTMENT_OTHER): Payer: Self-pay

## 2021-12-14 DIAGNOSIS — O4692 Antepartum hemorrhage, unspecified, second trimester: Secondary | ICD-10-CM

## 2021-12-14 DIAGNOSIS — Z3A25 25 weeks gestation of pregnancy: Secondary | ICD-10-CM

## 2021-12-14 DIAGNOSIS — N930 Postcoital and contact bleeding: Secondary | ICD-10-CM

## 2021-12-14 LAB — URINALYSIS, ROUTINE W REFLEX MICROSCOPIC
Bilirubin Urine: NEGATIVE
Glucose, UA: NEGATIVE mg/dL
Hgb urine dipstick: NEGATIVE
Ketones, ur: NEGATIVE mg/dL
Leukocytes,Ua: NEGATIVE
Nitrite: NEGATIVE
Protein, ur: NEGATIVE mg/dL
Specific Gravity, Urine: 1.018 (ref 1.005–1.030)
pH: 9 — ABNORMAL HIGH (ref 5.0–8.0)

## 2021-12-14 LAB — GC/CHLAMYDIA PROBE AMP (~~LOC~~) NOT AT ARMC
Chlamydia: NEGATIVE
Comment: NEGATIVE
Comment: NORMAL
Neisseria Gonorrhea: NEGATIVE

## 2021-12-14 LAB — WET PREP, GENITAL
Clue Cells Wet Prep HPF POC: NONE SEEN
Trich, Wet Prep: NONE SEEN
WBC, Wet Prep HPF POC: >=10 — AB (ref <10)
Yeast Wet Prep HPF POC: NONE SEEN

## 2021-12-14 MED ORDER — LACTATED RINGERS IV BOLUS
1000.0000 mL | Freq: Once | INTRAVENOUS | Status: AC
  Administered 2021-12-14: 1000 mL via INTRAVENOUS

## 2021-12-17 ENCOUNTER — Ambulatory Visit (INDEPENDENT_AMBULATORY_CARE_PROVIDER_SITE_OTHER): Payer: Self-pay | Admitting: Student

## 2021-12-17 ENCOUNTER — Other Ambulatory Visit: Payer: Self-pay

## 2021-12-17 ENCOUNTER — Encounter: Payer: Self-pay | Admitting: Family Medicine

## 2021-12-17 ENCOUNTER — Other Ambulatory Visit (HOSPITAL_COMMUNITY)
Admission: RE | Admit: 2021-12-17 | Discharge: 2021-12-17 | Disposition: A | Discharge status: Home / Self Care | Payer: Self-pay | Source: Ambulatory Visit | Attending: Family Medicine | Admitting: Family Medicine

## 2021-12-17 VITALS — BP 121/64 | HR 70 | Wt 178.9 lb

## 2021-12-17 DIAGNOSIS — N898 Other specified noninflammatory disorders of vagina: Secondary | ICD-10-CM | POA: Insufficient documentation

## 2021-12-17 DIAGNOSIS — O0990 Supervision of high risk pregnancy, unspecified, unspecified trimester: Secondary | ICD-10-CM

## 2021-12-17 DIAGNOSIS — Z3A25 25 weeks gestation of pregnancy: Secondary | ICD-10-CM

## 2021-12-17 NOTE — Progress Notes (Signed)
? ?  PRENATAL VISIT NOTE ? ?Subjective:  ?Natalie Aguilar is a 41 y.o. [phone] at 69w6dbeing seen today for ongoing prenatal care.  She is currently monitored for the following issues for this low-risk pregnancy and has Menorrhagia with irregular cycle; LGSIL on Pap smear of cervix; History of pre-eclampsia in prior pregnancy, currently pregnant; Advanced maternal age in multigravida; Supervision of high risk pregnancy, antepartum; Language barrier; and Marginal insertion of umbilical cord affecting management of mother on their problem list. ? ?Patient reports  itching and new discharge with some small clumps. She thinks she has a yeast infection . She has also recently been diagnosed with palpitations and is wearing a cardiac monitor; she has follow-up with cardiology planned for tomorrow.  Contractions: Irritability. Vag. Bleeding: None.  Movement: Present. Denies leaking of fluid.  ? ?The following portions of the patient's history were reviewed and updated as appropriate: allergies, current medications, past family history, past medical history, past social history, past surgical history and problem list.  ? ?Objective:  ? ?Vitals:  ? 12/17/21 1424  ?BP: 121/64  ?Pulse: 70  ?Weight: 178 lb 14.4 oz (81.1 kg)  ? ? ?Fetal Status: Fetal Heart Rate (bpm): 156   Movement: Present    ? ?General:  Alert, oriented and cooperative. Patient is in no acute distress.  ?Skin: Skin is warm and dry. No rash noted.   ?Cardiovascular: Normal heart rate noted  ?Respiratory: Normal respiratory effort, no problems with respiration noted  ?Abdomen: Soft, gravid, appropriate for gestational age.  Pain/Pressure: Absent     ?Pelvic: Cervical exam deferred        ?Extremities: Normal range of motion.  Edema: None  ?Mental Status: Normal mood and affect. Normal behavior. Normal judgment and thought content.  ? ?Assessment and Plan:  ?Pregnancy: [id number] 268w6d1. Supervision of high risk pregnancy, antepartum ?-taking baby ASA ?-will  do wet prep today to check for yeast ?-doing well with her BP; she has ECHO appt on 3/10 ?-instrcutions given for diabetes  ?-she has a echo and stress test tomorrow and follow up with cardis in three weeks ?-  ?Preterm labor symptoms and general obstetric precautions including but not limited to vaginal bleeding, contractions, leaking of fluid and fetal movement were reviewed in detail with the patient. ?Please refer to After Visit Summary for other counseling recommendations.  ? ?Return in about 4 weeks (around 01/14/2022), or 2. ? ?Future Appointments  ?Date Time Provider [place]?12/30/2021  8:40 AM WMC-WOCA LAB W[facility]-C[facility] WMC  ?12/30/2021  9:15 AM WaGabriel CarinaNM [facility]?01/01/2022 10:30 AM WMC-MFC NURSE [facility]FC WMC  ?01/01/2022 10:45 AM WMC-MFC US4 WMC-MFCUS WMC  ?01/08/2022  1:40 PM Tobb, KaGodfrey PickDO CVD-WMC None  ? ? ?KaStarr LakeCNM ? ?

## 2021-12-18 ENCOUNTER — Other Ambulatory Visit: Payer: Self-pay | Admitting: *Deleted

## 2021-12-18 ENCOUNTER — Ambulatory Visit (HOSPITAL_COMMUNITY): Payer: Self-pay | Attending: Cardiology

## 2021-12-18 DIAGNOSIS — R079 Chest pain, unspecified: Secondary | ICD-10-CM | POA: Insufficient documentation

## 2021-12-18 DIAGNOSIS — R040 Epistaxis: Secondary | ICD-10-CM

## 2021-12-18 DIAGNOSIS — R011 Cardiac murmur, unspecified: Secondary | ICD-10-CM | POA: Insufficient documentation

## 2021-12-18 LAB — CBC
Hematocrit: 37 % (ref 34.0–46.6)
Hemoglobin: 12.6 g/dL (ref 11.1–15.9)
MCH: 29 pg (ref 26.6–33.0)
MCHC: 34.1 g/dL (ref 31.5–35.7)
MCV: 85 fL (ref 79–97)
Platelets: 205 10*3/uL (ref 150–450)
RBC: 4.34 x10E6/uL (ref 3.77–5.28)
RDW: 17.3 % — ABNORMAL HIGH (ref 11.7–15.4)
WBC: 9.4 10*3/uL (ref 3.4–10.8)

## 2021-12-21 LAB — CERVICOVAGINAL ANCILLARY ONLY
Bacterial Vaginitis (gardnerella): NEGATIVE
Candida Glabrata: NEGATIVE
Candida Vaginitis: NEGATIVE
Comment: NEGATIVE
Comment: NEGATIVE
Comment: NEGATIVE
Comment: NEGATIVE
Trichomonas: NEGATIVE

## 2021-12-21 LAB — ECHOCARDIOGRAM COMPLETE
AR max vel: 2.11 cm2
AV Area VTI: 2.17 cm2
AV Area mean vel: 2.03 cm2
AV Mean grad: 5 mmHg
AV Peak grad: 9.4 mmHg
Ao pk vel: 1.53 m/s
Area-P 1/2: 2.11 cm2
S' Lateral: 3.1 cm

## 2021-12-30 ENCOUNTER — Ambulatory Visit: Payer: Self-pay | Attending: Nurse Practitioner

## 2021-12-30 ENCOUNTER — Encounter: Payer: Self-pay | Admitting: Certified Nurse Midwife

## 2021-12-30 ENCOUNTER — Other Ambulatory Visit (HOSPITAL_COMMUNITY): Payer: Self-pay

## 2021-12-30 ENCOUNTER — Other Ambulatory Visit: Payer: Self-pay

## 2021-12-30 ENCOUNTER — Ambulatory Visit (INDEPENDENT_AMBULATORY_CARE_PROVIDER_SITE_OTHER): Payer: Self-pay | Admitting: Certified Nurse Midwife

## 2021-12-30 ENCOUNTER — Other Ambulatory Visit: Payer: Self-pay | Admitting: General Practice

## 2021-12-30 VITALS — BP 118/67 | HR 69 | Wt 182.0 lb

## 2021-12-30 DIAGNOSIS — Z3A27 27 weeks gestation of pregnancy: Secondary | ICD-10-CM

## 2021-12-30 DIAGNOSIS — O0990 Supervision of high risk pregnancy, unspecified, unspecified trimester: Secondary | ICD-10-CM

## 2021-12-30 DIAGNOSIS — R109 Unspecified abdominal pain: Secondary | ICD-10-CM

## 2021-12-30 DIAGNOSIS — O09522 Supervision of elderly multigravida, second trimester: Secondary | ICD-10-CM

## 2021-12-30 DIAGNOSIS — O43199 Other malformation of placenta, unspecified trimester: Secondary | ICD-10-CM

## 2021-12-30 DIAGNOSIS — K219 Gastro-esophageal reflux disease without esophagitis: Secondary | ICD-10-CM

## 2021-12-30 DIAGNOSIS — O0992 Supervision of high risk pregnancy, unspecified, second trimester: Secondary | ICD-10-CM

## 2021-12-30 DIAGNOSIS — O99619 Diseases of the digestive system complicating pregnancy, unspecified trimester: Secondary | ICD-10-CM

## 2021-12-30 DIAGNOSIS — O26899 Other specified pregnancy related conditions, unspecified trimester: Secondary | ICD-10-CM

## 2021-12-30 NOTE — Progress Notes (Signed)
? ?  PRENATAL VISIT NOTE ? ?Subjective:  ?Natalie Aguilar is a 41 y.o. [phone] at 28w5dbeing seen today for ongoing prenatal care.  She is currently monitored for the following issues for this high-risk pregnancy and has LGSIL on Pap smear of cervix; History of pre-eclampsia in prior pregnancy, currently pregnant; Advanced maternal age in multigravida; Supervision of high risk pregnancy, antepartum; Language barrier; and Marginal insertion of umbilical cord affecting management of mother on their problem list. ? ?Patient reports  painful contractions, especially at night. Having a hard time eating in the morning because it causes bloating and belching, which is relieved by pepcid. Drinking about 33oz of water per day. Notes no difference in vaginal discharge or odor .  Contractions: Irritability. Vag. Bleeding: None.  Movement: Present. Denies leaking of fluid.  ? ?The following portions of the patient's history were reviewed and updated as appropriate: allergies, current medications, past family history, past medical history, past social history, past surgical history and problem list.  ? ?Objective:  ? ?Vitals:  ? 12/30/21 0959  ?BP: 118/67  ?Pulse: 69  ?Weight: 182 lb (82.6 kg)  ? ?Fetal Status: Fetal Heart Rate (bpm): 152 Fundal Height: 28 cm Movement: Present  Presentation: Vertex ? ?General:  Alert, oriented and cooperative. Patient is in no acute distress.  ?Skin: Skin is warm and dry. No rash noted.   ?Cardiovascular: Normal heart rate noted  ?Respiratory: Normal respiratory effort, no problems with respiration noted  ?Abdomen: Soft, gravid, appropriate for gestational age.  Pain/Pressure: Present     ?Pelvic: Cervical exam deferred        ?Extremities: Normal range of motion.  Edema: None  ?Mental Status: Normal mood and affect. Normal behavior. Normal judgment and thought content.  ? ?Assessment and Plan:  ?Pregnancy: [id number] 293w5d1. Supervision of high risk pregnancy in second trimester ?- Doing  well overall with plenty of fetal movement ? ?2. [redacted] weeks gestation of pregnancy ?- Routine OB care, GTT today ? ?3. Marginal insertion of umbilical cord affecting management of mother ?- Next U/S scheduled 01/01/22 ? ?4. Multigravida of advanced maternal age in second trimester ?- Discussed how AMA can affect pregnancy, especially if she is not eating/resting/hydrating enough ? ?5. Abdominal cramping affecting pregnancy ?- Going to try discussed lifestyle changes since swab two weeks ago was negative ? ?6. Gastroesophageal reflux in pregnancy ?- Advised to take pepcid daily before or after breakfast ? ?Preterm labor symptoms and general obstetric precautions including but not limited to vaginal bleeding, contractions, leaking of fluid and fetal movement were reviewed in detail with the patient. ?Please refer to After Visit Summary for other counseling recommendations.  ? ?Return for IN-PERSON, LOB. ? ?Future Appointments  ?Date Time Provider [place]?01/01/2022 10:30 AM WMC-MFC NURSE W[facility]-MFC WMC  ?01/01/2022 10:45 AM WMC-MFC US4 WMC-MFCUS WMC  ?01/08/2022  1:40 PM Tobb, KaGodfrey PickDO CVD-WMC None  ?01/14/2022  1:35 PM PiAletha HalimMD [facility]? ? ?JaGabriel CarinaCNM ?

## 2021-12-31 LAB — GLUCOSE TOLERANCE, 2 HOURS W/ 1HR
Glucose, 1 hour: 154 mg/dL (ref 70–179)
Glucose, 2 hour: 126 mg/dL (ref 70–152)
Glucose, Fasting: 79 mg/dL (ref 70–91)

## 2022-01-01 ENCOUNTER — Ambulatory Visit: Payer: Self-pay | Admitting: *Deleted

## 2022-01-01 ENCOUNTER — Ambulatory Visit: Payer: Self-pay | Attending: Obstetrics

## 2022-01-01 ENCOUNTER — Ambulatory Visit: Payer: Self-pay | Attending: Obstetrics and Gynecology | Admitting: Obstetrics and Gynecology

## 2022-01-01 ENCOUNTER — Other Ambulatory Visit (HOSPITAL_COMMUNITY): Payer: Self-pay

## 2022-01-01 ENCOUNTER — Other Ambulatory Visit: Payer: Self-pay | Admitting: Obstetrics

## 2022-01-01 ENCOUNTER — Other Ambulatory Visit: Payer: Self-pay | Admitting: *Deleted

## 2022-01-01 ENCOUNTER — Encounter: Payer: Self-pay | Admitting: *Deleted

## 2022-01-01 ENCOUNTER — Other Ambulatory Visit: Payer: Self-pay

## 2022-01-01 VITALS — BP 130/55 | HR 72

## 2022-01-01 DIAGNOSIS — O43193 Other malformation of placenta, third trimester: Secondary | ICD-10-CM

## 2022-01-01 DIAGNOSIS — O09522 Supervision of elderly multigravida, second trimester: Secondary | ICD-10-CM | POA: Insufficient documentation

## 2022-01-01 DIAGNOSIS — O43192 Other malformation of placenta, second trimester: Secondary | ICD-10-CM | POA: Insufficient documentation

## 2022-01-01 DIAGNOSIS — O09523 Supervision of elderly multigravida, third trimester: Secondary | ICD-10-CM

## 2022-01-01 DIAGNOSIS — O09293 Supervision of pregnancy with other poor reproductive or obstetric history, third trimester: Secondary | ICD-10-CM

## 2022-01-01 DIAGNOSIS — O09292 Supervision of pregnancy with other poor reproductive or obstetric history, second trimester: Secondary | ICD-10-CM | POA: Insufficient documentation

## 2022-01-01 DIAGNOSIS — Z3A28 28 weeks gestation of pregnancy: Secondary | ICD-10-CM

## 2022-01-01 DIAGNOSIS — O365930 Maternal care for other known or suspected poor fetal growth, third trimester, not applicable or unspecified: Secondary | ICD-10-CM

## 2022-01-01 DIAGNOSIS — O43199 Other malformation of placenta, unspecified trimester: Secondary | ICD-10-CM

## 2022-01-01 LAB — RPR: RPR Ser Ql: NONREACTIVE

## 2022-01-01 LAB — HIV ANTIBODY (ROUTINE TESTING W REFLEX): HIV Screen 4th Generation wRfx: NONREACTIVE

## 2022-01-01 NOTE — Progress Notes (Signed)
Maternal-Fetal Medicine  ? ?Name: Natalie Aguilar ?DOB: 02/27/1981 ?MRN: [id number] ?Referring Provider: Gaylan Gerold, CNM ? ?I had the pleasure of seeing [name] today at [facility].  She returned for fetal growth assessment.  Marginal cord insertion was seen at previous ultrasound. ?Obstetrical history significant for 3 term vaginal deliveries.  Her first son weighed 10 pounds at birth and the rest between 7 and 8 pounds at birth. ?She reports no chronic medical conditions.  She does not have gestational diabetes. ?Blood pressure today at her office is 130s/55 mmHg. ? ?Ultrasound ?Amniotic fluid is normal and good fetal activity seen.  Estimated fetal weight is at the 9th percentile and the abdominal circumference measurement is at the 12th percentile.  Umbilical artery Doppler showed normal forward diastolic flow. ? ?Fetal growth restriction ?I counseled the patient with a Spanish language interpreter present in the room. ?I explained the finding of fetal growth restriction that is difficult to differentiate from a constitutionally small for gestational age fetus. ?I discussed the possible causes of fetal growth restriction including placental insufficiency (most common cause), chromosomal anomalies and fetal infections.  Patient did not give history of fever or rashes. ?I explained that only amniocentesis will give a definitive result on the fetal karyotype and some genetic conditions (Microarray).  I explained amniocentesis procedure and possible complication of preterm delivery (1 and 500 procedures). ?I discussed ultrasound protocol of monitoring fetal growth restriction. ?Timing of delivery: Since small for gestational age fetuses have a higher chance of having perinatal mortality and morbidity, delivery at 38 to [redacted] weeks gestation is reasonable.  If, however, severe fetal growth restriction is seen with normal antenatal testing, we will recommend delivery at [redacted] weeks  gestation. ? ?After counseling, the patient opted not to have amniocentesis. ? ?Recommendations ?-An appointment was made for her to return in 2 weeks for umbilical artery Doppler study. ?-Weekly BPP from [redacted] weeks gestation till delivery ?-Fetal growth assessment in 3 weeks. ? ?Thank you for consultation.  If you have any questions or concerns, please contact me the [facility].  Consultation including face-to-face counseling 30 minutes. ? ? ? ? ?

## 2022-01-04 ENCOUNTER — Other Ambulatory Visit (HOSPITAL_COMMUNITY): Payer: Self-pay

## 2022-01-04 ENCOUNTER — Other Ambulatory Visit: Payer: Self-pay

## 2022-01-08 ENCOUNTER — Ambulatory Visit: Payer: Self-pay | Admitting: Cardiology

## 2022-01-11 ENCOUNTER — Other Ambulatory Visit: Payer: Self-pay

## 2022-01-12 ENCOUNTER — Other Ambulatory Visit (HOSPITAL_COMMUNITY): Payer: Self-pay

## 2022-01-14 ENCOUNTER — Ambulatory Visit: Payer: Self-pay | Admitting: *Deleted

## 2022-01-14 ENCOUNTER — Ambulatory Visit: Payer: Self-pay | Attending: Obstetrics and Gynecology

## 2022-01-14 ENCOUNTER — Ambulatory Visit (INDEPENDENT_AMBULATORY_CARE_PROVIDER_SITE_OTHER): Payer: Self-pay | Admitting: Obstetrics and Gynecology

## 2022-01-14 VITALS — BP 116/60 | HR 70

## 2022-01-14 VITALS — BP 126/72 | HR 73 | Wt 187.1 lb

## 2022-01-14 DIAGNOSIS — O09299 Supervision of pregnancy with other poor reproductive or obstetric history, unspecified trimester: Secondary | ICD-10-CM

## 2022-01-14 DIAGNOSIS — O365930 Maternal care for other known or suspected poor fetal growth, third trimester, not applicable or unspecified: Secondary | ICD-10-CM | POA: Insufficient documentation

## 2022-01-14 DIAGNOSIS — O09293 Supervision of pregnancy with other poor reproductive or obstetric history, third trimester: Secondary | ICD-10-CM

## 2022-01-14 DIAGNOSIS — Z789 Other specified health status: Secondary | ICD-10-CM

## 2022-01-14 DIAGNOSIS — R87612 Low grade squamous intraepithelial lesion on cytologic smear of cervix (LGSIL): Secondary | ICD-10-CM

## 2022-01-14 DIAGNOSIS — O09523 Supervision of elderly multigravida, third trimester: Secondary | ICD-10-CM

## 2022-01-14 DIAGNOSIS — O0990 Supervision of high risk pregnancy, unspecified, unspecified trimester: Secondary | ICD-10-CM

## 2022-01-14 DIAGNOSIS — O43199 Other malformation of placenta, unspecified trimester: Secondary | ICD-10-CM | POA: Insufficient documentation

## 2022-01-14 DIAGNOSIS — Z3A29 29 weeks gestation of pregnancy: Secondary | ICD-10-CM

## 2022-01-14 DIAGNOSIS — O43193 Other malformation of placenta, third trimester: Secondary | ICD-10-CM

## 2022-01-14 NOTE — Progress Notes (Signed)
? ?  PRENATAL VISIT NOTE ? ?Subjective:  ?Natalie Aguilar is a 41 y.o. [phone] at 53w6dbeing seen today for ongoing prenatal care.  She is currently monitored for the following issues for this high-risk pregnancy and has LGSIL on Pap smear of cervix; History of pre-eclampsia in prior pregnancy, currently pregnant; Advanced maternal age in multigravida; Supervision of high risk pregnancy, antepartum; Language barrier; Marginal insertion of umbilical cord affecting management of mother; and Poor fetal growth affecting management of mother in third trimester on their problem list. ? ?Patient reports no complaints.  Contractions: Not present. Vag. Bleeding: None.  Movement: Present. Denies leaking of fluid.  ? ?The following portions of the patient's history were reviewed and updated as appropriate: allergies, current medications, past family history, past medical history, past social history, past surgical history and problem list.  ? ?Objective:  ? ?Vitals:  ? 01/14/22 1354  ?BP: 126/72  ?Pulse: 73  ?Weight: 187 lb 1.6 oz (84.9 kg)  ? ? ?Fetal Status: Fetal Heart Rate (bpm): 147   Movement: Present    ? ?General:  Alert, oriented and cooperative. Patient is in no acute distress.  ?Skin: Skin is warm and dry. No rash noted.   ?Cardiovascular: Normal heart rate noted  ?Respiratory: Normal respiratory effort, no problems with respiration noted  ?Abdomen: Soft, gravid, appropriate for gestational age.  Pain/Pressure: Absent     ?Pelvic: Cervical exam deferred        ?Extremities: Normal range of motion.  Edema: None  ?Mental Status: Normal mood and affect. Normal behavior. Normal judgment and thought content.  ? ?Assessment and Plan:  ?Pregnancy: [id number] 252w6d1. [redacted] weeks gestation of pregnancy ?28wk labs normal ? ?2. Supervision of high risk pregnancy, antepartum ? ?3. Multigravida of advanced maternal age in third trimester ? ?4. Marginal insertion of umbilical cord affecting management of mother ? ?5. History of  pre-eclampsia in prior pregnancy, currently pregnant ?Continue low dose asa ? ?6. Poor fetal growth affecting management of mother in third trimester, single or unspecified fetus ?3/24: 9%, 1000g, ac 12%, afi 14, UA dopplers wnl ?Follow up UA dopplers today and 4/14 growth ? ?7. LGSIL on Pap smear of cervix ?Rpt pap and hpv 12/2021 ? ?8. Language barrier ?Interpreter used ? ?Preterm labor symptoms and general obstetric precautions including but not limited to vaginal bleeding, contractions, leaking of fluid and fetal movement were reviewed in detail with the patient. ?Please refer to After Visit Summary for other counseling recommendations.  ? ?Return in 15 days (on 01/29/2022) for in person, md visit, high risk ob. ? ?Future Appointments  ?Date Time Provider [place]?01/14/2022  2:30 PM WMC-MFC NURSE [facility]FC WMC  ?01/14/2022  2:45 PM WMC-MFC US4 WMC-MFCUS WMC  ?01/22/2022  3:15 PM WMC-MFC NURSE WMC-MFC WMC  ?01/22/2022  3:30 PM WMC-MFC US2 WMC-MFCUS [place]?01/29/2022 10:15 AM WMC-MFC NURSE W[facility]-MFC WMC  ?01/29/2022 10:30 AM WMC-MFC US2 WMC-MFCUS WMC  ? ? ?ChAletha HalimMD ? ?

## 2022-01-22 ENCOUNTER — Ambulatory Visit: Payer: Self-pay | Admitting: *Deleted

## 2022-01-22 ENCOUNTER — Ambulatory Visit: Payer: Self-pay | Attending: Obstetrics and Gynecology

## 2022-01-22 VITALS — BP 109/44 | HR 71

## 2022-01-22 DIAGNOSIS — O09523 Supervision of elderly multigravida, third trimester: Secondary | ICD-10-CM

## 2022-01-22 DIAGNOSIS — O43199 Other malformation of placenta, unspecified trimester: Secondary | ICD-10-CM | POA: Insufficient documentation

## 2022-01-22 DIAGNOSIS — Z3A31 31 weeks gestation of pregnancy: Secondary | ICD-10-CM

## 2022-01-22 DIAGNOSIS — O43193 Other malformation of placenta, third trimester: Secondary | ICD-10-CM

## 2022-01-22 DIAGNOSIS — O09293 Supervision of pregnancy with other poor reproductive or obstetric history, third trimester: Secondary | ICD-10-CM

## 2022-01-22 DIAGNOSIS — O365930 Maternal care for other known or suspected poor fetal growth, third trimester, not applicable or unspecified: Secondary | ICD-10-CM | POA: Insufficient documentation

## 2022-01-25 ENCOUNTER — Other Ambulatory Visit: Payer: Self-pay | Admitting: *Deleted

## 2022-01-25 DIAGNOSIS — O09523 Supervision of elderly multigravida, third trimester: Secondary | ICD-10-CM

## 2022-01-25 DIAGNOSIS — O365990 Maternal care for other known or suspected poor fetal growth, unspecified trimester, not applicable or unspecified: Secondary | ICD-10-CM

## 2022-01-25 DIAGNOSIS — O43193 Other malformation of placenta, third trimester: Secondary | ICD-10-CM

## 2022-01-29 ENCOUNTER — Ambulatory Visit (INDEPENDENT_AMBULATORY_CARE_PROVIDER_SITE_OTHER): Payer: Self-pay | Admitting: Obstetrics and Gynecology

## 2022-01-29 ENCOUNTER — Encounter: Payer: Self-pay | Admitting: *Deleted

## 2022-01-29 ENCOUNTER — Ambulatory Visit: Payer: Self-pay | Attending: Obstetrics and Gynecology

## 2022-01-29 ENCOUNTER — Ambulatory Visit: Payer: Self-pay | Admitting: *Deleted

## 2022-01-29 VITALS — BP 105/62 | HR 82 | Wt 188.1 lb

## 2022-01-29 VITALS — BP 119/57 | HR 62

## 2022-01-29 DIAGNOSIS — Z3A32 32 weeks gestation of pregnancy: Secondary | ICD-10-CM

## 2022-01-29 DIAGNOSIS — Z789 Other specified health status: Secondary | ICD-10-CM

## 2022-01-29 DIAGNOSIS — R87612 Low grade squamous intraepithelial lesion on cytologic smear of cervix (LGSIL): Secondary | ICD-10-CM

## 2022-01-29 DIAGNOSIS — O09523 Supervision of elderly multigravida, third trimester: Secondary | ICD-10-CM | POA: Insufficient documentation

## 2022-01-29 DIAGNOSIS — O43199 Other malformation of placenta, unspecified trimester: Secondary | ICD-10-CM | POA: Insufficient documentation

## 2022-01-29 DIAGNOSIS — O365930 Maternal care for other known or suspected poor fetal growth, third trimester, not applicable or unspecified: Secondary | ICD-10-CM

## 2022-01-29 DIAGNOSIS — O0990 Supervision of high risk pregnancy, unspecified, unspecified trimester: Secondary | ICD-10-CM

## 2022-01-29 DIAGNOSIS — O43193 Other malformation of placenta, third trimester: Secondary | ICD-10-CM

## 2022-01-29 DIAGNOSIS — O09293 Supervision of pregnancy with other poor reproductive or obstetric history, third trimester: Secondary | ICD-10-CM

## 2022-01-29 DIAGNOSIS — O09299 Supervision of pregnancy with other poor reproductive or obstetric history, unspecified trimester: Secondary | ICD-10-CM

## 2022-01-29 NOTE — Progress Notes (Addendum)
? ?  PRENATAL VISIT NOTE ? ?Subjective:  ?Natalie Aguilar is a 41 y.o. [phone] at 37w0dbeing seen today for ongoing prenatal care.  She is currently monitored for the following issues for this high-risk pregnancy and has LGSIL on Pap smear of cervix; History of pre-eclampsia in prior pregnancy, currently pregnant; Advanced maternal age in multigravida; Supervision of high risk pregnancy, antepartum; Language barrier; Marginal insertion of umbilical cord affecting management of mother; and Poor fetal growth affecting management of mother in third trimester on their problem list. ? ?Patient reports  sometimes "seeing spots" when standing for long periods of time .  Contractions: Irritability. Vag. Bleeding: None.  Movement: Present. Denies leaking of fluid.  ? ?The following portions of the patient's history were reviewed and updated as appropriate: allergies, current medications, past family history, past medical history, past social history, past surgical history and problem list.  ? ?Objective:  ? ?Vitals:  ? 01/29/22 0838  ?BP: 105/62  ?Pulse: 82  ?Weight: 188 lb 1.6 oz (85.3 kg)  ? ? ?Fetal Status: Fetal Heart Rate (bpm): 144   Movement: Present    ? ?General:  Alert, oriented and cooperative. Patient is in no acute distress.  ?Skin: Skin is warm and dry. No rash noted.   ?Cardiovascular: Normal heart rate noted  ?Respiratory: Normal respiratory effort, no problems with respiration noted  ?Abdomen: Soft, gravid, appropriate for gestational age.  Pain/Pressure: Present     ?Pelvic: Cervical exam deferred        ?Extremities: Normal range of motion.  Edema: Trace  ?Mental Status: Normal mood and affect. Normal behavior. Normal judgment and thought content.  ? ?Assessment and Plan:  ?Pregnancy: [id number] 38w0d1. Supervision of high risk pregnancy, antepartum ?Self pay. D/w pt re: BC next visit ?Recommend hydaration, frequent snacking, compression stockings and pregnancy belt and trying not to stand for too long  periods of time.  ? ?2. Multigravida of advanced maternal age in third trimester ? ?3. Language barrier ?Interpreter used ? ?4. History of pre-eclampsia in prior pregnancy, currently pregnant ?Continue low dose asa ? ?5. Marginal insertion of umbilical cord affecting management of mother ? ?6. LGSIL on Pap smear of cervix ?Repeat pap 2023 ? ?7. Poor fetal growth affecting management of mother in third trimester, single or unspecified fetus ?Resolved at last u/s: 4/14 24%, 1594g, ac 59%, afi 16, bpp 8/8 ?Patient has bpp today which may be hold over for when she had FGR. I told her to keep it for today and see if they want to continue it or push back her weekly testing.  ? ?Preterm labor symptoms and general obstetric precautions including but not limited to vaginal bleeding, contractions, leaking of fluid and fetal movement were reviewed in detail with the patient. ?Please refer to After Visit Summary for other counseling recommendations.  ? ?Return in about 2 weeks (around 02/12/2022) for in person, md or app, high risk ob. ? ?Future Appointments  ?Date Time Provider [place]?01/29/2022 10:15 AM [facility] NURSE [facility] WMC  ?01/29/2022 10:30 AM WMC-MFC US2 WMC-MFCUS WMC  ?02/18/2022  3:30 PM WMC-MFC NURSE [facility]-MFC WMC  ?02/18/2022  4:00 PM WMC-MFC US1 WMC-MFCUS WMC  ?02/26/2022  3:30 PM WMC-M[facility] NURSE [facility] WMC  ?02/26/2022  4:00 PM WMC-MFC US1 WMC-MFCUS WMC  ? ? ?ChAletha HalimMD ? ?

## 2022-02-09 ENCOUNTER — Telehealth: Payer: Self-pay

## 2022-02-09 NOTE — Telephone Encounter (Signed)
Received message from the front office that pt is having pain in her upper abdomin and she only feels it when she walks and moves and it started about a month ago.  Called pt with [facility] and informed pt that what she is feeling is called round ligament and is normal.  Pt recommended to try to use Tylenol, soak in a warm bath, or can try pelvic exercises.  Pt verbalized understanding.  ? ?Belkis Norbeck,RN  ?02/09/22 ?

## 2022-02-12 ENCOUNTER — Ambulatory Visit (INDEPENDENT_AMBULATORY_CARE_PROVIDER_SITE_OTHER): Payer: Self-pay | Admitting: Obstetrics & Gynecology

## 2022-02-12 VITALS — BP 107/57 | HR 71 | Wt 189.7 lb

## 2022-02-12 DIAGNOSIS — O09299 Supervision of pregnancy with other poor reproductive or obstetric history, unspecified trimester: Secondary | ICD-10-CM

## 2022-02-12 DIAGNOSIS — Z789 Other specified health status: Secondary | ICD-10-CM

## 2022-02-12 DIAGNOSIS — O0990 Supervision of high risk pregnancy, unspecified, unspecified trimester: Secondary | ICD-10-CM

## 2022-02-12 DIAGNOSIS — O43199 Other malformation of placenta, unspecified trimester: Secondary | ICD-10-CM

## 2022-02-12 DIAGNOSIS — O09523 Supervision of elderly multigravida, third trimester: Secondary | ICD-10-CM

## 2022-02-12 NOTE — Progress Notes (Signed)
? ?  PRENATAL VISIT NOTE ? ?Subjective:  ?Natalie Aguilar is a 41 y.o. [phone] at 44w0dbeing seen today for ongoing prenatal care.  She is currently monitored for the following issues for this high-risk pregnancy and has LGSIL on Pap smear of cervix; History of pre-eclampsia in prior pregnancy, currently pregnant; Advanced maternal age in multigravida; Supervision of high risk pregnancy, antepartum; Language barrier; Marginal insertion of umbilical cord affecting management of mother; and Poor fetal growth affecting management of mother in third trimester on their problem list. ? ?Patient reports no complaints.  Contractions: Irritability. Vag. Bleeding: None.  Movement: Present. Denies leaking of fluid.  ? ?The following portions of the patient's history were reviewed and updated as appropriate: allergies, current medications, past family history, past medical history, past social history, past surgical history and problem list.  ? ?Objective:  ? ?Vitals:  ? 02/12/22 1014  ?BP: (!) 107/57  ?Pulse: 71  ?Weight: 189 lb 11.2 oz (86 kg)  ? ? ?Fetal Status: Fetal Heart Rate (bpm): 164 Fundal Height: 34 cm Movement: Present    ? ?General:  Alert, oriented and cooperative. Patient is in no acute distress.  ?Skin: Skin is warm and dry. No rash noted.   ?Cardiovascular: Normal heart rate noted  ?Respiratory: Normal respiratory effort, no problems with respiration noted  ?Abdomen: Soft, gravid, appropriate for gestational age.  Pain/Pressure: Present     ?Pelvic: Cervical exam deferred        ?Extremities: Normal range of motion.     ?Mental Status: Normal mood and affect. Normal behavior. Normal judgment and thought content.  ? ?Assessment and Plan:  ?Pregnancy: GK3T4656at 341w0d1. Marginal insertion of umbilical cord affecting management of mother ?RUQ pain with benign exam ? ?2. Supervision of high risk pregnancy, antepartum ?F/u USKoreaor growth ? ?3. History of pre-eclampsia in prior pregnancy, currently pregnant ?36   Weeks start feta testing ? ?4. Multigravida of advanced maternal age in third trimester ? ? ?5. Language barrier ?Nl BP ? ?Preterm labor symptoms and general obstetric precautions including but not limited to vaginal bleeding, contractions, leaking of fluid and fetal movement were reviewed in detail with the patient. ?Please refer to After Visit Summary for other counseling recommendations.  ? ?Return in about 2 weeks (around 02/26/2022). ? ?Future Appointments  ?Date Time Provider [place]?02/18/2022  3:30 PM WMC-MFC NURSE WMC-MFC WMC  ?02/18/2022  3:45 PM WMC-MFC US1 WMC-MFCUS WMC  ?02/26/2022 10:35 AM WeHillard DankeruMyles RosenthalPA-C [facility]?02/26/2022  3:30 PM WMC-MFC NURSE WMC-MFC WMC  ?02/26/2022  3:45 PM WMC-MFC US1 WMC-MFCUS WMC  ?03/05/2022 11:15 AM ArWoodroe ModeMD WMA[facility] HospitalMS[facility]?03/12/2022  8:15 AM PiAletha HalimMD WMF[facility] HospitalM[facility]?03/19/2022 10:35 AM ArWoodroe ModeMD WMMonte[facility] HospitalM[facility]?03/26/2022 10:35 AM ArWoodroe ModeMD WMTwin Cities Community HospitalMReg[facility] Surgery [facility]? ? ?JaEmeterio ReeveMD ? ?

## 2022-02-18 ENCOUNTER — Ambulatory Visit: Payer: Self-pay | Attending: Maternal & Fetal Medicine

## 2022-02-18 ENCOUNTER — Encounter: Payer: Self-pay | Admitting: *Deleted

## 2022-02-18 ENCOUNTER — Ambulatory Visit: Payer: Self-pay | Admitting: *Deleted

## 2022-02-18 VITALS — BP 122/52 | HR 70

## 2022-02-18 DIAGNOSIS — Z3A34 34 weeks gestation of pregnancy: Secondary | ICD-10-CM

## 2022-02-18 DIAGNOSIS — O365990 Maternal care for other known or suspected poor fetal growth, unspecified trimester, not applicable or unspecified: Secondary | ICD-10-CM | POA: Insufficient documentation

## 2022-02-18 DIAGNOSIS — O09293 Supervision of pregnancy with other poor reproductive or obstetric history, third trimester: Secondary | ICD-10-CM

## 2022-02-18 DIAGNOSIS — O365930 Maternal care for other known or suspected poor fetal growth, third trimester, not applicable or unspecified: Secondary | ICD-10-CM

## 2022-02-18 DIAGNOSIS — O09523 Supervision of elderly multigravida, third trimester: Secondary | ICD-10-CM | POA: Insufficient documentation

## 2022-02-18 DIAGNOSIS — O43193 Other malformation of placenta, third trimester: Secondary | ICD-10-CM | POA: Insufficient documentation

## 2022-02-19 ENCOUNTER — Other Ambulatory Visit: Payer: Self-pay | Admitting: *Deleted

## 2022-02-19 DIAGNOSIS — O09523 Supervision of elderly multigravida, third trimester: Secondary | ICD-10-CM

## 2022-02-19 DIAGNOSIS — O43199 Other malformation of placenta, unspecified trimester: Secondary | ICD-10-CM

## 2022-02-26 ENCOUNTER — Ambulatory Visit: Payer: Self-pay | Admitting: *Deleted

## 2022-02-26 ENCOUNTER — Ambulatory Visit: Payer: Self-pay | Attending: Maternal & Fetal Medicine

## 2022-02-26 ENCOUNTER — Other Ambulatory Visit (HOSPITAL_COMMUNITY)
Admission: RE | Admit: 2022-02-26 | Discharge: 2022-02-26 | Disposition: A | Discharge status: Home / Self Care | Payer: Self-pay | Source: Ambulatory Visit | Attending: Medical | Admitting: Medical

## 2022-02-26 ENCOUNTER — Ambulatory Visit (INDEPENDENT_AMBULATORY_CARE_PROVIDER_SITE_OTHER): Payer: Self-pay | Admitting: Medical

## 2022-02-26 VITALS — BP 116/55 | HR 66

## 2022-02-26 VITALS — BP 135/65 | HR 73 | Wt 193.1 lb

## 2022-02-26 DIAGNOSIS — O43199 Other malformation of placenta, unspecified trimester: Secondary | ICD-10-CM

## 2022-02-26 DIAGNOSIS — Z789 Other specified health status: Secondary | ICD-10-CM

## 2022-02-26 DIAGNOSIS — O365930 Maternal care for other known or suspected poor fetal growth, third trimester, not applicable or unspecified: Secondary | ICD-10-CM

## 2022-02-26 DIAGNOSIS — R87612 Low grade squamous intraepithelial lesion on cytologic smear of cervix (LGSIL): Secondary | ICD-10-CM

## 2022-02-26 DIAGNOSIS — O09523 Supervision of elderly multigravida, third trimester: Secondary | ICD-10-CM

## 2022-02-26 DIAGNOSIS — O0990 Supervision of high risk pregnancy, unspecified, unspecified trimester: Secondary | ICD-10-CM | POA: Insufficient documentation

## 2022-02-26 DIAGNOSIS — Z3A36 36 weeks gestation of pregnancy: Secondary | ICD-10-CM

## 2022-02-26 DIAGNOSIS — O43193 Other malformation of placenta, third trimester: Secondary | ICD-10-CM | POA: Insufficient documentation

## 2022-02-26 DIAGNOSIS — O09299 Supervision of pregnancy with other poor reproductive or obstetric history, unspecified trimester: Secondary | ICD-10-CM

## 2022-02-26 DIAGNOSIS — O09293 Supervision of pregnancy with other poor reproductive or obstetric history, third trimester: Secondary | ICD-10-CM

## 2022-02-26 DIAGNOSIS — O365990 Maternal care for other known or suspected poor fetal growth, unspecified trimester, not applicable or unspecified: Secondary | ICD-10-CM | POA: Insufficient documentation

## 2022-02-26 NOTE — Progress Notes (Signed)
   PRENATAL VISIT NOTE  Subjective:  Natalie Aguilar is a 41 y.o. [phone] at 84w0dbeing seen today for ongoing prenatal care.  She is currently monitored for the following issues for this high-risk pregnancy and has LGSIL on Pap smear of cervix; History of pre-eclampsia in prior pregnancy, currently pregnant; Advanced maternal age in multigravida; Supervision of high risk pregnancy, antepartum; Language barrier; Marginal insertion of umbilical cord affecting management of mother; and Poor fetal growth affecting management of mother in third trimester on their problem list.  Patient reports contractions since 8pm, mild, intermittent.  Contractions: Irritability. Vag. Bleeding: None.  Movement: Present. Denies leaking of fluid.   The following portions of the patient's history were reviewed and updated as appropriate: allergies, current medications, past family history, past medical history, past social history, past surgical history and problem list.   Objective:   Vitals:   02/26/22 1106  BP: 135/65  Pulse: 73  Weight: 193 lb 1.6 oz (87.6 kg)    Fetal Status: Fetal Heart Rate (bpm): 145   Movement: Present  Presentation: Vertex  General:  Alert, oriented and cooperative. Patient is in no acute distress.  Skin: Skin is warm and dry. No rash noted.   Cardiovascular: Normal heart rate noted  Respiratory: Normal respiratory effort, no problems with respiration noted  Abdomen: Soft, gravid, appropriate for gestational age.  Pain/Pressure: Present     Pelvic: Cervical exam performed in the presence of a chaperone Dilation: Closed Effacement (%): 20 Station: Ballotable  Extremities: Normal range of motion.  Edema: None  Mental Status: Normal mood and affect. Normal behavior. Normal judgment and thought content.   Assessment and Plan:  Pregnancy: G4P3003 at 338w0d. Supervision of high risk pregnancy, antepartum - GC/Chlamydia probe amp ([place])not at ARU[facility], beta strep  (group b only)  2. Poor fetal growth affecting management of mother in third trimester, single or unspecified fetus - Resolved  3. Marginal insertion of umbilical cord affecting management of mother - Growth USKoreacheduled 6/9  4. History of pre-eclampsia in prior pregnancy, currently pregnant - Normotensive today   5. Language barrier - Interpreter present   6. Multigravida of advanced maternal age in third trimester - MFM following, antenatal testing started   7. LGSIL on Pap smear of cervix  8. [redacted] weeks gestation of pregnancy  Preterm labor symptoms and general obstetric precautions including but not limited to vaginal bleeding, contractions, leaking of fluid and fetal movement were reviewed in detail with the patient. Please refer to After Visit Summary for other counseling recommendations.   Return in about 1 week (around 03/05/2022) for [facility]OK[facility]PP, In-Person.  Future Appointments  Date Time Provider De[place]5/19/2023  3:30 PM WMC-MFC NURSE WMC-MFC WMD[facility]5/19/2023  3:45 PM WMC-MFC US1 WMC-MFCUS WMSt[facility]5/25/2023  2:00 PM WMC-MFC NURSE WMC-MFC WMCleveland [facility]ulatory [facility] LLC5/25/2023  2:15 PM WMC-MFC NST WMC-MFC WMVictor[facility]5/26/2023 11:15 AM ArWoodroe ModeMD WMSurgical [facility] Surgery CenterMLifec[facility]6/10/2021  3:30 PM WMC-MFC NURSE WMC-MFC WMReg[facility]6/10/2021  3:45 PM WMC-MFC US6 WMC-MFCUS WMCarolina Pines [facility]6/11/2021  8:15 AM PiAletha HalimMD WMSaint [facility]Ph[facility]MU[facility]6/06/2022 10:35 AM ArWoodroe ModeMD WMPacific Shores HospitalMCentracare [facility]6/06/2022 12:30 PM WMC-MFC NURSE WMC-MFC WMS[facility]6/06/2022 12:45 PM WMC-MFC US5 WMC-MFCUS WMSil[facility]6/16/2023 10:35 AM ArWoodroe ModeMD WMMedical Park Tower Surgery CenterM[facility]  JuKerry HoughPA-C

## 2022-03-01 ENCOUNTER — Encounter: Payer: Self-pay | Admitting: Medical

## 2022-03-01 DIAGNOSIS — A491 Streptococcal infection, unspecified site: Secondary | ICD-10-CM | POA: Insufficient documentation

## 2022-03-01 LAB — GC/CHLAMYDIA PROBE AMP (~~LOC~~) NOT AT ARMC
Chlamydia: NEGATIVE
Comment: NEGATIVE
Comment: NORMAL
Neisseria Gonorrhea: NEGATIVE

## 2022-03-01 LAB — CULTURE, BETA STREP (GROUP B ONLY): Strep Gp B Culture: POSITIVE — AB

## 2022-03-04 ENCOUNTER — Ambulatory Visit: Payer: Self-pay | Admitting: *Deleted

## 2022-03-04 ENCOUNTER — Ambulatory Visit: Payer: Self-pay | Attending: Obstetrics | Admitting: *Deleted

## 2022-03-04 VITALS — BP 115/54 | HR 68

## 2022-03-04 DIAGNOSIS — O43193 Other malformation of placenta, third trimester: Secondary | ICD-10-CM | POA: Insufficient documentation

## 2022-03-04 DIAGNOSIS — A491 Streptococcal infection, unspecified site: Secondary | ICD-10-CM

## 2022-03-04 DIAGNOSIS — Z3A36 36 weeks gestation of pregnancy: Secondary | ICD-10-CM

## 2022-03-04 DIAGNOSIS — O09523 Supervision of elderly multigravida, third trimester: Secondary | ICD-10-CM | POA: Insufficient documentation

## 2022-03-04 DIAGNOSIS — O365930 Maternal care for other known or suspected poor fetal growth, third trimester, not applicable or unspecified: Secondary | ICD-10-CM | POA: Insufficient documentation

## 2022-03-04 DIAGNOSIS — Z3A34 34 weeks gestation of pregnancy: Secondary | ICD-10-CM | POA: Insufficient documentation

## 2022-03-04 DIAGNOSIS — O09299 Supervision of pregnancy with other poor reproductive or obstetric history, unspecified trimester: Secondary | ICD-10-CM | POA: Insufficient documentation

## 2022-03-04 NOTE — Procedures (Signed)
Zenobia Karmen Stabs 11/02/1980 [redacted]w[redacted]d Fetus A Non-Stress Test Interpretation for 03/04/22  Indication: Advanced Maternal Age >40 years  Fetal Heart Rate A Mode: External Baseline Rate (A): 140 bpm Variability: Moderate Accelerations: 15 x 15 Decelerations: None Multiple birth?: No  Uterine Activity Mode: Palpation, Toco Contraction Frequency (min): 4 ucs with ui Contraction Duration (sec): 60-70 Contraction Quality: Mild Resting Tone Palpated: Relaxed Resting Time: Adequate  Interpretation (Fetal Testing) Nonstress Test Interpretation: Reactive Overall Impression: Reassuring for gestational age Comments: Dr.Fang reviewed tracing

## 2022-03-05 ENCOUNTER — Telehealth (INDEPENDENT_AMBULATORY_CARE_PROVIDER_SITE_OTHER): Payer: Self-pay | Admitting: Obstetrics & Gynecology

## 2022-03-05 ENCOUNTER — Encounter: Payer: Self-pay | Admitting: Obstetrics & Gynecology

## 2022-03-05 DIAGNOSIS — Z789 Other specified health status: Secondary | ICD-10-CM

## 2022-03-05 DIAGNOSIS — O0993 Supervision of high risk pregnancy, unspecified, third trimester: Secondary | ICD-10-CM

## 2022-03-05 DIAGNOSIS — O0990 Supervision of high risk pregnancy, unspecified, unspecified trimester: Secondary | ICD-10-CM

## 2022-03-05 DIAGNOSIS — O99820 Streptococcus B carrier state complicating pregnancy: Secondary | ICD-10-CM

## 2022-03-05 DIAGNOSIS — O09293 Supervision of pregnancy with other poor reproductive or obstetric history, third trimester: Secondary | ICD-10-CM

## 2022-03-05 DIAGNOSIS — A491 Streptococcal infection, unspecified site: Secondary | ICD-10-CM

## 2022-03-05 DIAGNOSIS — O43199 Other malformation of placenta, unspecified trimester: Secondary | ICD-10-CM

## 2022-03-05 DIAGNOSIS — Z3A37 37 weeks gestation of pregnancy: Secondary | ICD-10-CM

## 2022-03-05 DIAGNOSIS — O09299 Supervision of pregnancy with other poor reproductive or obstetric history, unspecified trimester: Secondary | ICD-10-CM

## 2022-03-05 NOTE — Progress Notes (Signed)
I connected with  Natalie Aguilar on 03/05/22 at 11:15 AM EDT by telephone and verified that I am speaking with the correct person using two identifiers.   I discussed the limitations, risks, security and privacy concerns of performing an evaluation and management service by telephone and the availability of in person appointments. I also discussed with the patient that there may be a patient responsible charge related to this service. The patient expressed understanding and agreed to proceed.  Reports swelling for past month in the evenings in bilateral legs. Swelling for past week and a half during entire day. Reports "seeing stars," feeling weak, and cold sweats on 03/03/22. Denies any s/s of elevated BP today. BP WNL at MFM appt yesterday.  [name], RN 03/05/2022  11:20 AM

## 2022-03-05 NOTE — Progress Notes (Signed)
I connected with Natalie Aguilar 03/05/22 at 11:15 AM EDT by: MyChart video and verified that I am speaking with the correct person using two identifiers.  Patient is located at home and provider is located at [facility].     I discussed the limitations, risks, security and privacy concerns of performing an evaluation and management service by MyChart video and the availability of in person appointments. I also discussed with the patient that there may be a patient responsible charge related to this service. By engaging in this virtual visit, you consent to the provision of healthcare.  Additionally, you authorize for your insurance to be billed for the services provided during this visit.  The patient expressed understanding and agreed to proceed.  The following staff members participated in the virtual visit:  [place] interpreter    PRENATAL VISIT NOTE  Subjective:  Natalie Aguilar is a 41 y.o. [phone] at [redacted]w[redacted]d for virtual visit for ongoing prenatal care.  She is currently monitored for the following issues for this high-risk pregnancy and has LGSIL on Pap smear of cervix; History of pre-eclampsia in prior pregnancy, currently pregnant; Advanced maternal age in multigravida; Supervision of high risk pregnancy, antepartum; Language barrier; Marginal insertion of umbilical cord affecting management of mother; Poor fetal growth affecting management of mother in third trimester; and Group B streptococcal infection on their problem list.  Patient reports no complaints.  Contractions: Irritability. Vag. Bleeding: None.  Movement: Present. Denies leaking of fluid.   The following portions of the patient's history were reviewed and updated as appropriate: allergies, current medications, past family history, past medical history, past social history, past surgical history and problem list.   Objective:  There were no vitals filed for this visit. Self-Obtained  Fetal Status:     Movement: Present      Assessment and Plan:  Pregnancy: G4P3003 at 317w0d. Group B streptococcal infection Patient aware of management  2. Marginal insertion of umbilical cord affecting management of mother Growth is adequate  3. Language barrier Spanish  4. Supervision of high risk pregnancy, antepartum   5. History of pre-eclampsia in prior pregnancy, currently pregnant Nl BP  Term labor symptoms and general obstetric precautions including but not limited to vaginal bleeding, contractions, leaking of fluid and fetal movement were reviewed in detail with the patient.  Return in about 1 week (around 03/12/2022).  Future Appointments  Date Time Provider DeBarron[place]6/10/2021  3:30 PM WMC-MFC NURSE WMChildren[facility] NewarkME[facility]6/10/2021  3:45 PM WMC-MFC US6 WMC-MFCUS W[facility]6/11/2021  8:15 AM PiAletha HalimMD WMHenrico [facility] Hospital - ParhamMBaylor [facility]6/06/2022 10:35 AM ArWoodroe ModeMD WMAthens Eye Surgery CenterMWe[facility]6/06/2022 12:30 PM WMC-MFC NURSE WMC-MFC WMBaptist [facility]6/06/2022 12:45 PM WMC-MFC US5 WMC-MFCUS WMDecatur [facility] Hospital6/16/2023 10:35 AM ArWoodroe ModeMD WMFranciscan Health Michigan CityMSquaw [facility]   Time spent on virtual visit: 12 minutes  JaEmeterio ReeveMD

## 2022-03-11 ENCOUNTER — Ambulatory Visit: Payer: Self-pay | Admitting: *Deleted

## 2022-03-11 ENCOUNTER — Ambulatory Visit: Payer: Self-pay | Attending: Obstetrics

## 2022-03-11 ENCOUNTER — Other Ambulatory Visit: Payer: Self-pay | Admitting: Obstetrics

## 2022-03-11 VITALS — BP 113/66 | HR 66

## 2022-03-11 DIAGNOSIS — O43199 Other malformation of placenta, unspecified trimester: Secondary | ICD-10-CM

## 2022-03-11 DIAGNOSIS — Z3A37 37 weeks gestation of pregnancy: Secondary | ICD-10-CM

## 2022-03-11 DIAGNOSIS — O43193 Other malformation of placenta, third trimester: Secondary | ICD-10-CM

## 2022-03-11 DIAGNOSIS — O09523 Supervision of elderly multigravida, third trimester: Secondary | ICD-10-CM

## 2022-03-11 DIAGNOSIS — A491 Streptococcal infection, unspecified site: Secondary | ICD-10-CM | POA: Insufficient documentation

## 2022-03-11 DIAGNOSIS — O365930 Maternal care for other known or suspected poor fetal growth, third trimester, not applicable or unspecified: Secondary | ICD-10-CM

## 2022-03-11 DIAGNOSIS — O09293 Supervision of pregnancy with other poor reproductive or obstetric history, third trimester: Secondary | ICD-10-CM

## 2022-03-11 DIAGNOSIS — O283 Abnormal ultrasonic finding on antenatal screening of mother: Secondary | ICD-10-CM | POA: Insufficient documentation

## 2022-03-11 NOTE — Procedures (Signed)
Natalie Aguilar Stabs June 19, 1981 [redacted]w[redacted]d Fetus A Non-Stress Test Interpretation for 03/11/22  Indication: Unsatisfactory BPP  Fetal Heart Rate A Mode: External Baseline Rate (A): 140 bpm Variability: Moderate Accelerations: 15 x 15 Decelerations: None Multiple birth?: No  Uterine Activity Mode: Palpation, Toco Contraction Frequency (min): q 4 Contraction Duration (sec): 80-110 Contraction Quality: Mild Resting Tone Palpated: Relaxed Resting Time: Adequate  Interpretation (Fetal Testing) Nonstress Test Interpretation: Reactive Overall Impression: Reassuring for gestational age Comments: Dr. FAnnamaria Bootsreviewed tracing

## 2022-03-11 NOTE — Progress Notes (Signed)
Pt reports decreased fetal movement since yesterday. She did not notify her Doctor since she was coming in today.  Pt states feeling low ab cramping along with low back pain for the past few days.

## 2022-03-12 ENCOUNTER — Telehealth: Payer: Self-pay

## 2022-03-12 ENCOUNTER — Other Ambulatory Visit: Payer: Self-pay | Admitting: Advanced Practice Midwife

## 2022-03-12 ENCOUNTER — Ambulatory Visit (INDEPENDENT_AMBULATORY_CARE_PROVIDER_SITE_OTHER): Payer: Self-pay | Admitting: Obstetrics and Gynecology

## 2022-03-12 VITALS — BP 115/56 | HR 68 | Wt 195.4 lb

## 2022-03-12 DIAGNOSIS — Z3A38 38 weeks gestation of pregnancy: Secondary | ICD-10-CM

## 2022-03-12 DIAGNOSIS — O0990 Supervision of high risk pregnancy, unspecified, unspecified trimester: Secondary | ICD-10-CM

## 2022-03-12 DIAGNOSIS — A491 Streptococcal infection, unspecified site: Secondary | ICD-10-CM

## 2022-03-12 DIAGNOSIS — Z789 Other specified health status: Secondary | ICD-10-CM

## 2022-03-12 DIAGNOSIS — O365930 Maternal care for other known or suspected poor fetal growth, third trimester, not applicable or unspecified: Secondary | ICD-10-CM

## 2022-03-12 DIAGNOSIS — O09523 Supervision of elderly multigravida, third trimester: Secondary | ICD-10-CM

## 2022-03-12 DIAGNOSIS — R600 Localized edema: Secondary | ICD-10-CM

## 2022-03-12 DIAGNOSIS — O09299 Supervision of pregnancy with other poor reproductive or obstetric history, unspecified trimester: Secondary | ICD-10-CM

## 2022-03-12 DIAGNOSIS — O43199 Other malformation of placenta, unspecified trimester: Secondary | ICD-10-CM

## 2022-03-12 DIAGNOSIS — R87612 Low grade squamous intraepithelial lesion on cytologic smear of cervix (LGSIL): Secondary | ICD-10-CM

## 2022-03-12 NOTE — Progress Notes (Signed)
Pt states feet is swelling with pain.

## 2022-03-12 NOTE — Progress Notes (Signed)
   PRENATAL VISIT NOTE  Subjective:  Natalie Aguilar is a 41 y.o. [phone] at 51w0dbeing seen today for ongoing prenatal care.  She is currently monitored for the following issues for this high-risk pregnancy and has LGSIL on Pap smear of cervix; History of pre-eclampsia in prior pregnancy, currently pregnant; Advanced maternal age in multigravida; Supervision of high risk pregnancy, antepartum; Language barrier; Marginal insertion of umbilical cord affecting management of mother; Poor fetal growth affecting management of mother in third trimester; and Group B streptococcal infection on their problem list.  Patient reports b/l LE edema.  Contractions: Irritability. Vag. Bleeding: None.  Movement: Present. Denies leaking of fluid.   The following portions of the patient's history were reviewed and updated as appropriate: allergies, current medications, past family history, past medical history, past social history, past surgical history and problem list.   Objective:   Vitals:   03/12/22 0842  BP: (!) 115/56  Pulse: 68  Weight: 195 lb 6.4 oz (88.6 kg)    Fetal Status: Fetal Heart Rate (bpm): 140   Movement: Present  Presentation: Vertex  General:  Alert, oriented and cooperative. Patient is in no acute distress.  Skin: Skin is warm and dry. No rash noted.   Cardiovascular: Normal heart rate noted  Respiratory: Normal respiratory effort, no problems with respiration noted  Abdomen: Soft, gravid, appropriate for gestational age.  Pain/Pressure: Absent     Pelvic: Cervical exam deferred        Extremities: Normal range of motion.  Edema: Trace  Mental Status: Normal mood and affect. Normal behavior. Normal judgment and thought content.   Assessment and Plan:  Pregnancy: G4P3003 at 373w0d. [redacted] weeks gestation of pregnancy  2. Bilateral lower extremity edema 1+ b/l symmetric. Compression stockings, elevation advised. No e/o DVT  3. Supervision of high risk pregnancy,  antepartum Recommend 39wk delivery which she is amenable to  4. Poor fetal growth affecting management of mother in third trimester, single or unspecified fetus resolved  5. Marginal insertion of umbilical cord affecting management of mother  6. LGSIL on Pap smear of cervix Needs pp pap   7. Language barrier Interpreter used  8. History of pre-eclampsia in prior pregnancy, currently pregnant  9. Group B streptococcal infection Tx in labor  10. Multigravida of advanced maternal age in third trimester 6/1: bpp 8/9/32cephalic 5/3/55efw 3373%ith ac 75%  Term labor symptoms and general obstetric precautions including but not limited to vaginal bleeding, contractions, leaking of fluid and fetal movement were reviewed in detail with the patient. Please refer to After Visit Summary for other counseling recommendations.   No follow-ups on file.  Future Appointments  Date Time Provider DeSunn[place]6/06/2022 10:35 AM ArWoodroe ModeMD WMSun City Center Ambulatory Surgery CenterMKalk[facility]6/06/2022 12:30 PM WMC-MFC NURSE WMC-MFC WMCommunity [facility]oscopy [facility]6/06/2022 12:45 PM WMC-MFC US5 WMC-MFCUS WMAssencion Saint [facility]6/16/2023 10:35 AM ArWoodroe ModeMD WMParkridge Medical CenterMRockford [facility]  ChAletha HalimMD

## 2022-03-12 NOTE — Telephone Encounter (Signed)
Called Pt using [facility] id# [phone] to advise of Induction that's scheduled for 03/19/22 @ midnight 7 to arrive at 11:30p on 03/18/22. Pt verbalized understanding.

## 2022-03-17 ENCOUNTER — Other Ambulatory Visit: Payer: Self-pay | Admitting: Advanced Practice Midwife

## 2022-03-18 ENCOUNTER — Other Ambulatory Visit: Payer: Self-pay

## 2022-03-19 ENCOUNTER — Encounter: Payer: Self-pay | Admitting: Obstetrics & Gynecology

## 2022-03-19 ENCOUNTER — Ambulatory Visit: Payer: Self-pay

## 2022-03-19 ENCOUNTER — Encounter (HOSPITAL_COMMUNITY): Payer: Self-pay | Admitting: Obstetrics and Gynecology

## 2022-03-19 ENCOUNTER — Inpatient Hospital Stay (HOSPITAL_COMMUNITY): Payer: Medicaid Other

## 2022-03-19 ENCOUNTER — Inpatient Hospital Stay (HOSPITAL_COMMUNITY)
Admission: AD | Admit: 2022-03-19 | Discharge: 2022-03-21 | DRG: 807 | Disposition: A | Discharge status: Home / Self Care | Payer: Medicaid Other | Attending: Obstetrics and Gynecology | Admitting: Obstetrics and Gynecology

## 2022-03-19 DIAGNOSIS — O99824 Streptococcus B carrier state complicating childbirth: Secondary | ICD-10-CM

## 2022-03-19 DIAGNOSIS — Z3A39 39 weeks gestation of pregnancy: Secondary | ICD-10-CM

## 2022-03-19 DIAGNOSIS — O09523 Supervision of elderly multigravida, third trimester: Secondary | ICD-10-CM | POA: Diagnosis present

## 2022-03-19 DIAGNOSIS — O26893 Other specified pregnancy related conditions, third trimester: Principal | ICD-10-CM | POA: Diagnosis present

## 2022-03-19 DIAGNOSIS — A491 Streptococcal infection, unspecified site: Principal | ICD-10-CM

## 2022-03-19 DIAGNOSIS — O43123 Velamentous insertion of umbilical cord, third trimester: Secondary | ICD-10-CM | POA: Diagnosis present

## 2022-03-19 DIAGNOSIS — O0990 Supervision of high risk pregnancy, unspecified, unspecified trimester: Secondary | ICD-10-CM

## 2022-03-19 LAB — CBC
HCT: 38.6 % (ref 36.0–46.0)
Hemoglobin: 13.4 g/dL (ref 12.0–15.0)
MCH: 30 pg (ref 26.0–34.0)
MCHC: 34.7 g/dL (ref 30.0–36.0)
MCV: 86.4 fL (ref 80.0–100.0)
Platelets: 190 10*3/uL (ref 150–400)
RBC: 4.47 MIL/uL (ref 3.87–5.11)
RDW: 14.5 % (ref 11.5–15.5)
WBC: 8 10*3/uL (ref 4.0–10.5)
nRBC: 0 % (ref 0.0–0.2)

## 2022-03-19 LAB — TYPE AND SCREEN
ABO/RH(D): A POS
Antibody Screen: NEGATIVE

## 2022-03-19 LAB — RPR: RPR Ser Ql: NONREACTIVE

## 2022-03-19 MED ORDER — FENTANYL CITRATE (PF) 100 MCG/2ML IJ SOLN
INTRAMUSCULAR | Status: AC
  Filled 2022-03-19: qty 2

## 2022-03-19 MED ORDER — BENZOCAINE-MENTHOL 20-0.5 % EX AERO: 1.0000 "application " | INHALATION_SPRAY | CUTANEOUS | Status: DC | PRN

## 2022-03-19 MED ORDER — OXYTOCIN-SODIUM CHLORIDE 30-0.9 UT/500ML-% IV SOLN
1.0000 m[IU]/min | INTRAVENOUS | Status: DC
  Administered 2022-03-19: 2 m[IU]/min via INTRAVENOUS
  Filled 2022-03-19: qty 500

## 2022-03-19 MED ORDER — ZOLPIDEM TARTRATE 5 MG PO TABS: 5.0000 mg | ORAL_TABLET | Freq: Every evening | ORAL | Status: DC | PRN

## 2022-03-19 MED ORDER — ONDANSETRON HCL 4 MG PO TABS: 4.0000 mg | ORAL_TABLET | ORAL | Status: DC | PRN

## 2022-03-19 MED ORDER — FENTANYL CITRATE (PF) 100 MCG/2ML IJ SOLN: 50.0000 ug | INTRAMUSCULAR | Status: DC | PRN

## 2022-03-19 MED ORDER — SENNOSIDES-DOCUSATE SODIUM 8.6-50 MG PO TABS
2.0000 | ORAL_TABLET | Freq: Every day | ORAL | Status: DC
  Administered 2022-03-20: 2 via ORAL
  Filled 2022-03-19 (×2): qty 2

## 2022-03-19 MED ORDER — ACETAMINOPHEN 325 MG PO TABS: 650.0000 mg | ORAL_TABLET | ORAL | Status: DC | PRN

## 2022-03-19 MED ORDER — MISOPROSTOL 50MCG HALF TABLET
50.0000 ug | ORAL_TABLET | ORAL | Status: DC | PRN
  Administered 2022-03-19 (×2): 50 ug via ORAL
  Filled 2022-03-19 (×2): qty 1

## 2022-03-19 MED ORDER — TERBUTALINE SULFATE 1 MG/ML IJ SOLN: 0.2500 mg | Freq: Once | INTRAMUSCULAR | Status: DC | PRN

## 2022-03-19 MED ORDER — PENICILLIN G POT IN DEXTROSE 60000 UNIT/ML IV SOLN
3.0000 10*6.[IU] | INTRAVENOUS | Status: DC
  Administered 2022-03-19 (×4): 3 10*6.[IU] via INTRAVENOUS
  Filled 2022-03-19 (×4): qty 50

## 2022-03-19 MED ORDER — DIBUCAINE (PERIANAL) 1 % EX OINT: 1.0000 "application " | TOPICAL_OINTMENT | CUTANEOUS | Status: DC | PRN

## 2022-03-19 MED ORDER — OXYCODONE HCL 5 MG PO TABS: 10.0000 mg | ORAL_TABLET | ORAL | Status: DC | PRN

## 2022-03-19 MED ORDER — LACTATED RINGERS IV SOLN: 500.0000 mL | INTRAVENOUS | Status: DC | PRN

## 2022-03-19 MED ORDER — SOD CITRATE-CITRIC ACID 500-334 MG/5ML PO SOLN
30.0000 mL | ORAL | Status: DC | PRN
Start: 2022-03-19 — End: 2022-03-19

## 2022-03-19 MED ORDER — DIPHENHYDRAMINE HCL 25 MG PO CAPS: 25.0000 mg | ORAL_CAPSULE | Freq: Four times a day (QID) | ORAL | Status: DC | PRN

## 2022-03-19 MED ORDER — MISOPROSTOL 25 MCG QUARTER TABLET
25.0000 ug | ORAL_TABLET | ORAL | Status: DC | PRN
Start: 2022-03-19 — End: 2022-03-19
  Filled 2022-03-19: qty 1

## 2022-03-19 MED ORDER — PRENATAL MULTIVITAMIN CH
1.0000 | ORAL_TABLET | Freq: Every day | ORAL | Status: DC
  Administered 2022-03-20 – 2022-03-21 (×2): 1 via ORAL
  Filled 2022-03-19 (×2): qty 1

## 2022-03-19 MED ORDER — OXYTOCIN-SODIUM CHLORIDE 30-0.9 UT/500ML-% IV SOLN: 2.5000 [IU]/h | INTRAVENOUS | Status: DC

## 2022-03-19 MED ORDER — LIDOCAINE HCL (PF) 1 % IJ SOLN
30.0000 mL | INTRAMUSCULAR | Status: DC | PRN
Start: 2022-03-19 — End: 2022-03-19

## 2022-03-19 MED ORDER — SODIUM CHLORIDE 0.9 % IV SOLN
5.0000 10*6.[IU] | Freq: Once | INTRAVENOUS | Status: AC
  Administered 2022-03-19: 5 10*6.[IU] via INTRAVENOUS
  Filled 2022-03-19: qty 5

## 2022-03-19 MED ORDER — WITCH HAZEL-GLYCERIN EX PADS: 1.0000 "application " | MEDICATED_PAD | CUTANEOUS | Status: DC | PRN

## 2022-03-19 MED ORDER — FENTANYL CITRATE (PF) 100 MCG/2ML IJ SOLN
100.0000 ug | INTRAMUSCULAR | Status: DC | PRN
  Administered 2022-03-19: 100 ug via INTRAVENOUS

## 2022-03-19 MED ORDER — LACTATED RINGERS IV SOLN: INTRAVENOUS | Status: DC

## 2022-03-19 MED ORDER — OXYCODONE HCL 5 MG PO TABS: 5.0000 mg | ORAL_TABLET | ORAL | Status: DC | PRN

## 2022-03-19 MED ORDER — COCONUT OIL OIL: 1.0000 "application " | TOPICAL_OIL | Status: DC | PRN

## 2022-03-19 MED ORDER — IBUPROFEN 600 MG PO TABS
600.0000 mg | ORAL_TABLET | Freq: Four times a day (QID) | ORAL | Status: DC
  Administered 2022-03-19 – 2022-03-21 (×3): 600 mg via ORAL
  Filled 2022-03-19 (×6): qty 1

## 2022-03-19 MED ORDER — OXYTOCIN BOLUS FROM INFUSION
333.0000 mL | Freq: Once | INTRAVENOUS | Status: AC
  Administered 2022-03-19: 333 mL via INTRAVENOUS

## 2022-03-19 MED ORDER — ONDANSETRON HCL 4 MG/2ML IJ SOLN: 4.0000 mg | INTRAMUSCULAR | Status: DC | PRN

## 2022-03-19 MED ORDER — SIMETHICONE 80 MG PO CHEW: 80.0000 mg | CHEWABLE_TABLET | ORAL | Status: DC | PRN

## 2022-03-19 MED ORDER — ONDANSETRON HCL 4 MG/2ML IJ SOLN
4.0000 mg | Freq: Four times a day (QID) | INTRAMUSCULAR | Status: DC | PRN
Start: 2022-03-19 — End: 2022-03-19

## 2022-03-19 NOTE — H&P (Signed)
Natalie Aguilar is a 41 y.o. female [phone] with IUP at 6w0dpresenting for IOL for AMA. [facility] Prenatal History/Complications:  Hx PreEclampsia w/1st pg AMA Term SVD X3  FGR (resolved) Marginal cord insertion  Past Medical History: Past Medical History:  Diagnosis Date   Depression    Irregular heart rate    Menorrhagia with irregular cycle 08/29/2020   Pregnancy induced hypertension    first preg   UTI (urinary tract infection)     Past Surgical History: Past Surgical History:  Procedure Laterality Date   COLONOSCOPY  07/22/2020   NO PAST SURGERIES      Obstetrical History: OB History     Gravida  4   Para  3   Term  3   Preterm      AB      Living  3      SAB      IAB      Ectopic      Multiple      Live Births  3           Social History: Social History   Socioeconomic History   Marital status: DSoil scientist   Spouse name: Miguel   Number of children: 3   Years of education: Not on file   Highest education level: 10th grade  Occupational History   Not on file  Tobacco Use   Smoking status: Never   Smokeless tobacco: Never  Vaping Use   Vaping Use: Never used  Substance and Sexual Activity   Alcohol use: Not Currently    Comment: occasionally    Drug use: No   Sexual activity: Yes    Birth control/protection: None  Other Topics Concern   Not on file  Social History Narrative   Not on file   Social Determinants of Health   Financial Resource Strain: Not on file  Food Insecurity: No Food Insecurity (02/26/2022)   Hunger Vital Sign    Worried About Running Out of Food in the Last Year: Never true    Ran Out of Food in the Last Year: Never true  Transportation Needs: No Transportation Needs (02/26/2022)   PRAPARE - THydrologist(Medical): No    Lack of Transportation (Non-Medical): No  Physical Activity: Not on file  Stress: Not on file  Social Connections: Not on file     Family History: Family History  Problem Relation Age of Onset   Hypertension Mother    Diabetes Mother    Thyroid disease Mother    Fibroids Mother    HIV/AIDS Father    Fibroids Sister    Colon cancer Neg Hx    Esophageal cancer Neg Hx    Rectal cancer Neg Hx    Stomach cancer Neg Hx     Allergies: No Known Allergies  Medications Prior to Admission  Medication Sig Dispense Refill Last Dose   aspirin EC 81 MG tablet Take 1 tablet (81 mg total) by mouth daily. Take after 12 weeks for prevention of preeclampsia later in pregnancy 300 tablet 2 03/18/2022   Prenatal Vit-Fe Fumarate-FA (PRENATAL VITAMIN PO) Take by mouth.   03/18/2022   famotidine (PEPCID) 20 MG tablet Take 1 tablet (20 mg total) by mouth 2 (two) times daily. 60 tablet 3         Review of Systems   Constitutional: Negative for fever and chills Eyes: Negative for visual disturbances Respiratory: Negative for shortness  of breath, dyspnea Cardiovascular: Negative for chest pain or palpitations  Gastrointestinal: Negative for abdominal pain, vomiting, diarrhea and constipation.   Genitourinary: Negative for dysuria and urgency Musculoskeletal: Negative for back pain, joint pain, myalgias  Neurological: Negative for dizziness and headaches      Temperature 97.9 F (36.6 C), temperature source Oral, height '5\' 5"'$  (1.651 m), weight 89.2 kg, last menstrual period 06/19/2021. General appearance: alert, cooperative, and no distress Lungs: normal respiratory effort Heart: regular rate and rhythm Abdomen: soft, non-tender; bowel sounds normal Extremities: Homans sign is negative, no sign of DVT DTR's 2+ Presentation: cephalic Fetal monitoring  Baseline: 150 bpm, Variability: Good {> 6 bpm), Accelerations: Reactive, and Decelerations: Absent Uterine activity  mild, irregular  Dilation: 2.5 Effacement (%): Thick Exam by:: Manus Gunning CNM   Prenatal labs: ABO, Rh: --/--/A POS (06/09 0034) Antibody: NEG (06/09  0034) Rubella: immune RPR: Non Reactive (03/22 0907)  HBsAg: Negative (11/17 1004)  HIV: Non Reactive (03/22 1540)  GBS: Positive/-- (05/19 1158)   Nursing Staff Provider  Office Location  [facility] Dating   LMP  Language  Spanish Anatomy US   normal anatomy, marginal cord insertion, concern for fetal growth> has MFM FUP scheduled>resolved  Flu Vaccine  Letter to [facility] Genetic/Carrier Screen  NIPS: LR   AFP:   wnl Horizon: negative  TDaP Vaccine   01/11/2022 Hgb A1C or  GTT 5.4 Third trimester   COVID Vaccine declined   LAB RESULTS   Rhogam   NA Blood Type A/Positive/-- (11/17 1004)   Baby Feeding Plan formula&Breast  Antibody Negative (11/17 1004)  Contraception vasectomy Rubella 6.85 (11/17 1004)  Circumcision GIRL RPR Non Reactive (11/17 1004)   [place] HBsAg Negative (11/17 1004)   Support Person Boyfriend  HCVAb  Negative 08-27-21  Prenatal Classes  HIV Non Reactive (11/17 1004)     BTL Consent NA GBS  POSITIVE  VBAC Consent NA Pap  Needs PP - 10-20-20 abnorm - had colpo w/ LSIL in March 2022       DME Rx [x ] BP cuff [x ] Weight Scale Waterbirth  '[ ]'$  Class '[ ]'$  Consent '[ ]'$  CNM visit  PHQ9 & GAD7 [ x ] new OB [  ] 28 weeks  [  ] 36 weeks Induction  '[ ]'$  Orders Entered '[ ]'$ Foley Y/N    Prenatal Transfer Tool  Maternal Diabetes: No Genetic Screening: Normal Maternal Ultrasounds/Referrals: IUGR, resolved Fetal Ultrasounds or other Referrals:  None Maternal Substance Abuse:  No Significant Maternal Medications:  None Significant Maternal Lab Results: Group B Strep positive    Results for orders placed or performed during the hospital encounter of 03/19/22 (from the past 24 hour(s))  CBC   Collection Time: 03/19/22 12:34 AM  Result Value Ref Range   WBC 8.0 4.0 - 10.5 K/uL   RBC 4.47 3.87 - 5.11 MIL/uL   Hemoglobin 13.4 12.0 - 15.0 g/dL   HCT 38.6 36.0 - 46.0 %   MCV 86.4 80.0 - 100.0 fL   MCH 30.0 26.0 - 34.0 pg   MCHC 34.7 30.0 - 36.0 g/dL   RDW  14.5 11.5 - 15.5 %   Platelets 190 150 - 400 K/uL   nRBC 0.0 0.0 - 0.2 %  Type and screen   Collection Time: 03/19/22 12:34 AM  Result Value Ref Range   ABO/RH(D) A POS    Antibody Screen NEG    Sample Expiration      03/22/2022,2359 Performed at [facility]  [facility], [place] [address] [place], [place] [address]     Assessment: Natalie Aguilar is a 41 y.o. [phone] with an IUP at 64w0dpresenting for IOL for AMA.  Plan: #Labor: Start cytotec after 1st dose of PCN is finished infusing, ~ 0400. Pt wishes to avoid pitocin if possible, so will plan AROM after cx thins some #Pain:  Per request #FWB Cat 1 #ID: GBS: PCN    FChristin Fudge6/06/2022, 1:33 AM

## 2022-03-19 NOTE — Progress Notes (Signed)
Patient Vitals for the past 4 hrs:  BP Temp Temp src Pulse Resp  03/19/22 0719 (!) 102/44 98.1 F (36.7 C) Oral 68 16  03/19/22 0610 135/69 -- -- 64 --   Pain is m inimal.  FHR Cat 1.  Ctx q 2-4 minutes.  Cx 4/thick/-3.  Will give another cytotec orally, plan AROM.

## 2022-03-19 NOTE — Discharge Summary (Signed)
Postpartum Discharge Summary  Date of Service updated-6/11     Patient Name: Natalie Aguilar DOB: [birth year] MRN: [id number]  Date of admission: 03/19/2022 Delivery date:03/19/2022  Delivering provider: Edd Arbour R  Date of discharge: 03/21/2022  Admitting diagnosis: AMA (advanced maternal age) multigravida 35+, third trimester [O09.523] Intrauterine pregnancy: [redacted]w[redacted]d     Secondary diagnosis:  Principal Problem:   AMA (advanced maternal age) multigravida 35+, third trimester  Additional problems: none    Discharge diagnosis: Term Pregnancy Delivered                                              Postpartum procedures: none Augmentation: AROM, Pitocin, and Cytotec Complications: None  Hospital course: Induction of Labor With Vaginal Delivery   41 y.o. yo [phone] at [redacted]w[redacted]d was admitted to the hospital 03/19/2022 for induction of labor.  Indication for induction: AMA.  Patient had an uncomplicated labor course as follows: Membrane Rupture Time/Date: 6:40 PM ,03/19/2022   Delivery Method:Vaginal, Spontaneous  Episiotomy: None  Lacerations:    Details of delivery can be found in separate delivery note.  Patient had a routine postpartum course. Patient is discharged home 03/21/22.  Newborn Data: Birth date:03/19/2022  Birth time:7:47 PM  Gender:Female  Living status:Living  Apgars:9 ,9  Weight:2970 g   Magnesium Sulfate received: No BMZ received: No Rhophylac:N/A MMR:N/A T-DaP:Given prenatally Flu: N/A Transfusion:No  Physical exam  Vitals:   03/20/22 1154 03/20/22 1700 03/20/22 2020 03/21/22 0621  BP: (!) 111/54 119/70 (!) 109/59 (!) 96/52  Pulse: (!) 58 66 63 (!) 55  Resp: 18 18 18 18   Temp: 98.1 F (36.7 C) 98 F (36.7 C) 98.6 F (37 C) 97.8 F (36.6 C)  TempSrc: Oral Oral Oral Oral  SpO2:  99% 100% 100%  Weight:      Height:       General: alert, cooperative, and no distress Lochia: appropriate Uterine Fundus: firm Incision: N/A DVT Evaluation: No evidence  of DVT seen on physical exam. Labs: Lab Results  Component Value Date   WBC 11.1 (H) 03/20/2022   HGB 12.8 03/20/2022   HCT 37.5 03/20/2022   MCV 87.4 03/20/2022   PLT 151 03/20/2022      Latest Ref Rng & Units 08/27/2021   10:04 AM  CMP  Glucose 70 - 99 mg/dL 87   BUN 6 - 24 mg/dL 10   Creatinine 1.32 - 1.00 mg/dL 4.40   Sodium 102 - 725 mmol/L 139   Potassium 3.5 - 5.2 mmol/L 3.8   Chloride 96 - 106 mmol/L 103   CO2 20 - 29 mmol/L 22   Calcium 8.7 - 10.2 mg/dL 9.0   Total Protein 6.0 - 8.5 g/dL 7.0   Total Bilirubin 0.0 - 1.2 mg/dL 0.5   Alkaline Phos 44 - 121 IU/L 45   AST 0 - 40 IU/L 14   ALT 0 - 32 IU/L 12    Edinburgh Score:    03/20/2022    3:00 AM  Edinburgh Postnatal Depression Scale Screening Tool  I have been able to laugh and see the funny side of things. 0  I have looked forward with enjoyment to things. 0  I have blamed myself unnecessarily when things went wrong. 0  I have been anxious or worried for no good reason. 0  I have felt scared or panicky for no good reason.  0  Things have been getting on top of me. 0  I have been so unhappy that I have had difficulty sleeping. 0  I have felt sad or miserable. 0  I have been so unhappy that I have been crying. 0  The thought of harming myself has occurred to me. 0  Edinburgh Postnatal Depression Scale Total 0     After visit meds:  Allergies as of 03/21/2022   No Known Allergies      Medication List     STOP taking these medications    aspirin EC 81 MG tablet   famotidine 20 MG tablet Commonly known as: PEPCID   PRENATAL VITAMIN PO       TAKE these medications    acetaminophen 325 MG tablet Commonly known as: Tylenol Take 2 tablets (650 mg total) by mouth every 4 (four) hours as needed (for pain scale < 4).   ibuprofen 600 MG tablet Commonly known as: ADVIL Take 1 tablet (600 mg total) by mouth every 6 (six) hours as needed.         Discharge home in stable condition Infant  Feeding: Bottle and Breast Infant Disposition:home with mother Discharge instruction: per After Visit Summary and Postpartum booklet. Activity: Advance as tolerated. Pelvic rest for 6 weeks.  Diet: routine diet Future Appointments:No future appointments.  Follow up Visit:  Follow-up Information     [facility] for [facility]. Schedule an appointment as soon as possible for a visit in 4 week(s).   Specialty: Obstetrics and Gynecology Contact information: [address] [place] [address] [phone]               Message sent to [facility] 03/19/22 Please schedule this patient for a In person postpartum visit in 4 weeks with the following provider: Any provider. Additional Postpartum F/U: None   High risk pregnancy complicated by:  AMA Delivery mode:  Vaginal, Spontaneous  Anticipated Birth Control:   vasectomy   03/21/2022 Sharon Seller, DO

## 2022-03-19 NOTE — Progress Notes (Signed)
Patient ID: Natalie Aguilar, female   DOB: Oct 17, 1980, 41 y.o.   MRN: [id number]  Labor Progress Note Natalie Aguilar is a 41 y.o. G4P3003 at 90w0dpresented for IOL for advanced maternal age  S:  Pt resting in bed with FOB at bedside. Not feeling any contractions.  O:  BP (!) 102/44   Pulse 68   Temp 98.1 F (36.7 C) (Oral)   Resp 16   Ht '5\' 5"'$  (1.651 m)   Wt 196 lb 9.6 oz (89.2 kg)   LMP 06/19/2021   BMI 32.72 kg/m  EFM: baseline 145 bpm/ moderate variability/ 15x15 accels/ no decels  Toco/IUPC: q2-431m SVE: Dilation: 4 Effacement (%): Thick Station: -3 Presentation: Vertex Exam by:: FrManus GunningNM Pitocin: none  A/P: [age].o. G4P3003 3958w0d. Labor: Latent labor, pt wanting to avoid pitocin. 2. FWB: cat 1 3. Pain: no complaints of pain  Was planning to AROM but on exam, fetal station was too high and cervix still very posterior. Encouraged pt to get up and ambulate, will attempt AROM after walking and rotating hips on the birth ball. Anticipate SVD.  JamGabriel CarinaNM 10:54 AM

## 2022-03-19 NOTE — Progress Notes (Signed)
Patient ID: Natalie Aguilar, female   DOB: [birth year], 41 y.o.   MRN: [id number]  Labor Progress Note Keviana Guida is a 41 y.o. [phone] at 26w0dpresented for IOL for advanced maternal age  S:  Pt sitting on the birth ball with FOB at bedside, has been up ambulating.  O:  BP 120/65   Pulse 67   Temp 98 F (36.7 C) (Oral)   Resp 14   Ht '5\' 5"'$  (1.651 m)   Wt 196 lb 9.6 oz (89.2 kg)   LMP 06/19/2021   BMI 32.72 kg/m  EFM: baseline 145 bpm/ moderate variability/ 15x15 accels/ no decels  Toco/IUPC: q2-444m SVE: Dilation: 4 Effacement (%): Thick Station: -3 Presentation: Vertex Exam by::  (u/s by J Candie ChromanNM) Pitocin: to start  A/P: [age].o. G4P3003 3936w0d. Labor: Latent labor, no change from earlier 2. FWB: cat 1 3. Pain: no complaints of pain  RN rechecked pt and felt pt was 4cm externally but only 2-3cm internally. Spoke to patient about need to start Pitocin to give her more consistently strong contractions. Pt amenable to pitocin titration and ambulation. Anticipate SVD.  JamGaylan GeroldNM, MSN, IBCRussiartified Nurse Midwife, [place] [place]

## 2022-03-20 LAB — CBC
HCT: 37.5 % (ref 36.0–46.0)
Hemoglobin: 12.8 g/dL (ref 12.0–15.0)
MCH: 29.8 pg (ref 26.0–34.0)
MCHC: 34.1 g/dL (ref 30.0–36.0)
MCV: 87.4 fL (ref 80.0–100.0)
Platelets: 151 10*3/uL (ref 150–400)
RBC: 4.29 MIL/uL (ref 3.87–5.11)
RDW: 14.6 % (ref 11.5–15.5)
WBC: 11.1 10*3/uL — ABNORMAL HIGH (ref 4.0–10.5)
nRBC: 0 % (ref 0.0–0.2)

## 2022-03-20 NOTE — Progress Notes (Addendum)
POSTPARTUM PROGRESS NOTE  iPad spanish interpreter used for visit  Subjective: Natalie Aguilar is a 41 y.o. J0R1594 PPD#1 s/p SVD at [redacted]w[redacted]d  She reports she doing well. No acute events overnight. She denies any problems with ambulating, voiding or po intake. Denies nausea or vomiting. She has not yet passed flatus. Pain is well controlled.  Lochia is very minimal.  Objective: Blood pressure (!) 102/53, pulse 60, temperature 98.4 F (36.9 C), temperature source Oral, resp. rate 18, height '5\' 5"'$  (1.651 m), weight 89.2 kg, last menstrual period 06/19/2021, unknown if currently breastfeeding.  Physical Exam:  General: alert, cooperative and no distress Chest: no respiratory distress Abdomen: soft, non-tender  Uterine Fundus: firm, appropriately tender Extremities: No calf swelling or tenderness  no edema  Recent Labs    03/19/22 0034 03/20/22 0520  HGB 13.4 12.8  HCT 38.6 37.5    Assessment/Plan: Natalie Kohlsis a 41y.o. GV8P9292PPD#1 s/p SVD at 38w0dRoutine Postpartum Care: Doing well, pain well-controlled.  -- Continue routine care, lactation support  -- Contraception: plans partner vasectomy -- Feeding: breast  Dispo: Plan for discharge PPD#2 given evening delivery.  AnRenard MatterMD, MPH OB Fellow, [facility]

## 2022-03-20 NOTE — Progress Notes (Signed)
MOB was referred for history of depression. * Referral screened out by Clinical Social Worker because none of the following criteria appear to apply: ~ History of anxiety/depression during this pregnancy, or of post-partum depression following prior delivery. Per prenatal records review, no concerns of depression noted. ~ Diagnosis of anxiety and/or depression within last 3 years. Per chart review, MOB's depression dates back to 2012.  OR * MOB's symptoms currently being treated with medication and/or therapy.  Please contact the Clinical Social Worker if needs arise, by [facility] request, or if MOB scores greater than 9/yes to question 10 on Edinburgh Postpartum Depression Screen.  Abundio Miu, [place] Worker [facility] Cell#: [phone]

## 2022-03-20 NOTE — Lactation Note (Signed)
This note was copied from a baby's chart. Lactation Consultation Note  Patient Name: Natalie Aguilar HDQQI'W Date: 03/20/2022 Reason for consult: Initial assessment;Term Age:41 hours   P4 mother whose infant is now 35 hours old.  This is a term baby at 39+0 weeks.  Mother's current feeding preference is breast/formula.  Spanish interpreter, Lu Duffel [phone]) used for interpretation.  Baby "Natalie Aguilar" was asleep in the bassinet when I arrived.  Mother reported that she has been latching and feeding well since delivery.  Last LATCH score was an 8; baby has had one stool.  Breast feeding basics reviewed.  Encouraged to call for latch assistance as needed.  Father present.  RN updated.   Maternal Data Has patient been taught Hand Expression?: Yes Does the patient have breastfeeding experience prior to this delivery?: Yes How long did the patient breastfeed?: Mother reported a "very short time" with her other children; the youngest is now 102  Feeding Mother's Current Feeding Choice: Breast Milk and Formula  LATCH Score                    Lactation Tools Discussed/Used    Interventions Interventions: Breast feeding basics reviewed;LC Services brochure  Discharge Pump: Personal  Consult Status Consult Status: Follow-up Date: 03/21/22 Follow-up type: In-patient    Dera Vanaken R Liany Mumpower 03/20/2022, 8:11 AM

## 2022-03-21 ENCOUNTER — Encounter (HOSPITAL_COMMUNITY): Payer: Self-pay | Admitting: Obstetrics and Gynecology

## 2022-03-21 MED ORDER — IBUPROFEN 600 MG PO TABS: 600.0000 mg | ORAL_TABLET | Freq: Four times a day (QID) | ORAL | 0 refills | Status: DC | PRN

## 2022-03-21 MED ORDER — ACETAMINOPHEN 325 MG PO TABS: 650.0000 mg | ORAL_TABLET | ORAL | Status: DC | PRN

## 2022-03-21 NOTE — Progress Notes (Signed)
RN notified CSW that MOB wanted to speak with CSW.   CSW met with MOB at bedside and utilized [employer] language [employer] spanish video interpreter Geraldo Pitter [phone]). CSW introduced self and explained role. CSW inquired about how MOB was doing, MOB reported that she was doing good. MOB inquired about emergency medicaid. CSW explained that financial counselors complete screenings for Medicaid and agreed to notify Financial Counselor that MOB was interested in [place], MOB agreeable. MOB confirmed phone number and address listed on face sheet. CSW inquired about any additional needs/questions. MOB reported none and thanked CSW.   CSW Advice worker requesting follow up with MOB.   Abundio Miu, [place] Worker [facility] Cell#: [phone]

## 2022-03-21 NOTE — Lactation Note (Signed)
This note was copied from a baby's chart. Lactation Consultation Note  Patient Name: Natalie Aguilar ZRAQT'M Date: 03/21/2022 Reason for consult: Follow-up assessment;Difficult latch;Term Age:41 hours  LC in to room prior to discharge. Mother reports baby breastfeeds non-stop and nipples are sore. .  Mother latches independently, mildly engorged and seems to be in a lot of pain with latch Evidence of bleeding in right nipple. Reinforced positioning with support pillows, deep latch. Reviewed pace bottle-feed, upright position.   Encouraged mother to used EBM to nipples, air-dry and use comfort gels for healing purposes. Discussed local resources to support her breastfeeding goals such as WIC and [facility] Lactation Services warm line.   Plan: 1-Feeding on demand or 8-12 times in 24h period. 2-Pump or hand-express and offer first EBM. 3-EBM to nipples, air-dry and use comfort gels/coconut oil  4-Encouraged maternal rest, hydration and food intake.   Maternal Data Has patient been taught Hand Expression?: Yes  Feeding Mother's Current Feeding Choice: Breast Milk and Formula  LATCH Score Latch: Grasps breast easily, tongue down, lips flanged, rhythmical sucking.  Audible Swallowing: Spontaneous and intermittent  Type of Nipple: Everted at rest and after stimulation  Comfort (Breast/Nipple): Filling, red/small blisters or bruises, mild/mod discomfort  Hold (Positioning): Assistance needed to correctly position infant at breast and maintain latch.  LATCH Score: 8   Lactation Tools Discussed/Used Tools: Pump;Flanges Flange Size: 24;27 Breast pump type: Manual Pump Education: Setup, frequency, and cleaning;Milk Storage Reason for Pumping: stimulation and supplementation Pumping frequency: as needed Pumped volume: 5 mL (with demonstration)  Interventions Interventions: Breast feeding basics reviewed;Assisted with latch;Skin to skin;Breast massage;Hand express;Breast compression;Hand  pump;Expressed milk;Support pillows;Adjust position;Education;Pace feeding  Discharge Discharge Education: Engorgement and breast care;Warning signs for feeding baby Pump: Personal;Manual WIC Program: Yes  Consult Status Consult Status: Complete Date: 03/21/22 Follow-up type: Call as needed    [place] 03/21/2022, 12:26 PM

## 2022-03-22 ENCOUNTER — Encounter (HOSPITAL_COMMUNITY): Payer: Self-pay | Admitting: Obstetrics and Gynecology

## 2022-03-22 ENCOUNTER — Inpatient Hospital Stay (HOSPITAL_COMMUNITY)
Admission: AD | Admit: 2022-03-22 | Discharge: 2022-03-22 | Disposition: A | Discharge status: Home / Self Care | Payer: Self-pay | Attending: Obstetrics and Gynecology | Admitting: Obstetrics and Gynecology

## 2022-03-22 ENCOUNTER — Other Ambulatory Visit: Payer: Self-pay

## 2022-03-22 DIAGNOSIS — O9089 Other complications of the puerperium, not elsewhere classified: Secondary | ICD-10-CM | POA: Insufficient documentation

## 2022-03-22 DIAGNOSIS — R600 Localized edema: Secondary | ICD-10-CM | POA: Insufficient documentation

## 2022-03-22 DIAGNOSIS — R609 Edema, unspecified: Secondary | ICD-10-CM

## 2022-03-22 DIAGNOSIS — M62830 Muscle spasm of back: Secondary | ICD-10-CM | POA: Insufficient documentation

## 2022-03-22 DIAGNOSIS — Z79899 Other long term (current) drug therapy: Secondary | ICD-10-CM | POA: Insufficient documentation

## 2022-03-22 MED ORDER — FUROSEMIDE 20 MG PO TABS
20.0000 mg | ORAL_TABLET | Freq: Every day | ORAL | 0 refills | Status: DC
Start: 2022-03-22 — End: 2024-01-17
  Filled 2022-03-22: qty 5, 5d supply, fill #0

## 2022-03-22 NOTE — MAU Note (Addendum)
Natalie Aguilar is a 41 y.o. here in MAU reporting: vag del 6/9. Feet and hands are very swollen.  (Increased since delivery) yesterday was seeing lots of lights/stars. Today her feet started feeling hot and tingling. Having some low back pain, started today.   Onset of complaint: today Pain score: 4 Vitals:   03/22/22 1855  BP: 111/69  Pulse: 64  Resp: 17  Temp: 99 F (37.2 C)  SpO2: 98%      Lab orders placed from triage:  none

## 2022-03-22 NOTE — MAU Provider Note (Signed)
History     CSN: [id number]  Arrival date and time: 03/22/22 1832   Event Date/Time   First Provider Initiated Contact with Patient 03/22/22 1934      Chief Complaint  Patient presents with   Foot Swelling   swollen hands   Back Pain   HPI 41 year old G4, P4 who is 2 days postpartum from a vaginal delivery.  She presents with swelling in her hands and feet since this morning.  She is having tingling in her hands and feet and burning.  She also has right-sided midthoracic back pain without radiation that started since this morning as well.  Pain is pretty mild and without palliative or provoking factors.  OB History     Gravida  4   Para  4   Term  4   Preterm      AB      Living  4      SAB      IAB      Ectopic      Multiple  0   Live Births  4           Past Medical History:  Diagnosis Date   Depression    Irregular heart rate    Menorrhagia with irregular cycle 08/29/2020   Pregnancy induced hypertension    first preg   UTI (urinary tract infection)     Past Surgical History:  Procedure Laterality Date   COLONOSCOPY  07/22/2020   NO PAST SURGERIES      Family History  Problem Relation Age of Onset   Hypertension Mother    Diabetes Mother    Thyroid disease Mother    Fibroids Mother    HIV/AIDS Father    Fibroids Sister    Colon cancer Neg Hx    Esophageal cancer Neg Hx    Rectal cancer Neg Hx    Stomach cancer Neg Hx     Social History   Tobacco Use   Smoking status: Never   Smokeless tobacco: Never  Vaping Use   Vaping Use: Never used  Substance Use Topics   Alcohol use: Not Currently    Comment: occasionally    Drug use: No    Allergies: No Known Allergies  Medications Prior to Admission  Medication Sig Dispense Refill Last Dose   Prenatal Vit-Fe Fumarate-FA (MULTIVITAMIN-PRENATAL) 27-0.8 MG TABS tablet Take 1 tablet by mouth daily at 12 noon.   03/21/2022   acetaminophen (TYLENOL) 325 MG tablet Take 2 tablets (650  mg total) by mouth every 4 (four) hours as needed (for pain scale < 4).      ibuprofen (ADVIL) 600 MG tablet Take 1 tablet (600 mg total) by mouth every 6 (six) hours as needed. 30 tablet 0     Review of Systems Physical Exam   Blood pressure 121/65, pulse 61, temperature 99 F (37.2 C), temperature source Oral, resp. rate 17, SpO2 98 %, currently breastfeeding.  Physical Exam Vitals reviewed.  Constitutional:      Appearance: Normal appearance.  Pulmonary:     Effort: Pulmonary effort is normal.  Abdominal:     General: Abdomen is flat. There is no distension.     Palpations: Abdomen is soft.     Tenderness: There is no abdominal tenderness.  Musculoskeletal:        General: Swelling (in hands and feet bilaterally) present.       Arms:  Skin:    General: Skin is warm and dry.  Capillary Refill: Capillary refill takes less than 2 seconds.  Neurological:     General: No focal deficit present.     Mental Status: She is alert.  Psychiatric:        Mood and Affect: Mood normal.        Behavior: Behavior normal.        Thought Content: Thought content normal.     MAU Course  Procedures  MDM  Assessment and Plan   1. Peripheral edema   2. Spasm of thoracic back muscle    Lasix prescribed -take 20 mg daily for the next 5 days to help with edema.  As blood pressures are normal, no further work-up needed.  This is likely secondary to fluid shifting that is well-known during this part of the postpartum period.  Ibuprofen for back pain.  Also recommended warm compresses or heating pad.  Truett Mainland 03/22/2022, 7:41 PM

## 2022-03-22 NOTE — Discharge Instructions (Signed)
Toma una pastilla de Lasix cada manana por 5 dias para ayudar con la hinchazon.  Puede tomar ibuprofen 400-'600mg'$  cada 6 horas para el dolor en la espalda.

## 2022-03-23 ENCOUNTER — Other Ambulatory Visit: Payer: Self-pay

## 2022-03-26 ENCOUNTER — Encounter: Payer: Self-pay | Admitting: Obstetrics & Gynecology

## 2022-03-27 ENCOUNTER — Telehealth (HOSPITAL_COMMUNITY): Payer: Self-pay

## 2022-03-27 NOTE — Telephone Encounter (Signed)
  No answer. Left message to return nurse call.  Sharyn Lull [facility] 06/17//2023,1653

## 2022-04-26 ENCOUNTER — Ambulatory Visit (INDEPENDENT_AMBULATORY_CARE_PROVIDER_SITE_OTHER): Payer: Self-pay | Admitting: Obstetrics and Gynecology

## 2022-04-26 ENCOUNTER — Other Ambulatory Visit: Payer: Self-pay

## 2022-04-26 ENCOUNTER — Encounter: Payer: Self-pay | Admitting: Obstetrics and Gynecology

## 2022-04-26 NOTE — Progress Notes (Signed)
    Post Partum Visit Note  Natalie Aguilar is a 41 y.o. T0P5465 June 9th SVD/intact perineum at 39/0weeks. Pt was induced for AMA.  Anesthesia: none. Postpartum course has been uncomplicated. Baby is doing well. Baby is feeding by breast. Bleeding stopped recently. Bowel function is  Constipation . Bladder function is normal. Patient is not sexually active. Contraception method is none. Postpartum depression screening: negative.   Edinburgh Postnatal Depression Scale - 04/26/22 1124       Edinburgh Postnatal Depression Scale:  In the Past 7 Days   I have been able to laugh and see the funny side of things. 0    I have looked forward with enjoyment to things. 0    I have been anxious or worried for no good reason. 0    I have felt scared or panicky for no good reason. 0    Things have been getting on top of me. 0    I have been so unhappy that I have had difficulty sleeping. 0    I have felt sad or miserable. 0    I have been so unhappy that I have been crying. 0    The thought of harming myself has occurred to me. 0             Review of Systems A comprehensive review of systems was negative.  Objective:  BP 116/77   Pulse 68   Ht '5\' 5"'$  (1.651 m)   Wt 172 lb (78 kg)   Breastfeeding Yes   BMI 28.62 kg/m    NAD  Assessment:   Normal postpartum visit.   Plan:  *PP: routine care. Pt to follow up at the HD for birth control. 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 D/w her re: BCCCP for coverage.   Interpreter used  Aletha Halim, [place] for [employer], [place]

## 2022-04-29 ENCOUNTER — Telehealth: Payer: Self-pay

## 2022-04-29 NOTE — Telephone Encounter (Signed)
-----   Message from Aletha Halim, MD sent at 04/29/2022 11:17 AM EDT ----- Regarding: let her know about bcccp Patient is self pay and needs a repeat pap middle next year  I think I forgot to mention to her to call us next year to set her up with BCCCP in order to get this done. Can you let her know? Interpreter needed.  thanks

## 2022-04-29 NOTE — Telephone Encounter (Signed)
Forwarded message to BCCCP to follow up with patient for pap smear repeat in 1 year.  [place]

## 2022-06-07 IMAGING — US US MFM OB FOLLOW-UP
1 series · 13 of 28 positions shown · non-contrast
Comparison: none

[Series 1: us mfm ob follow-up · 13 of 74 slices shown]
[im 3/74]
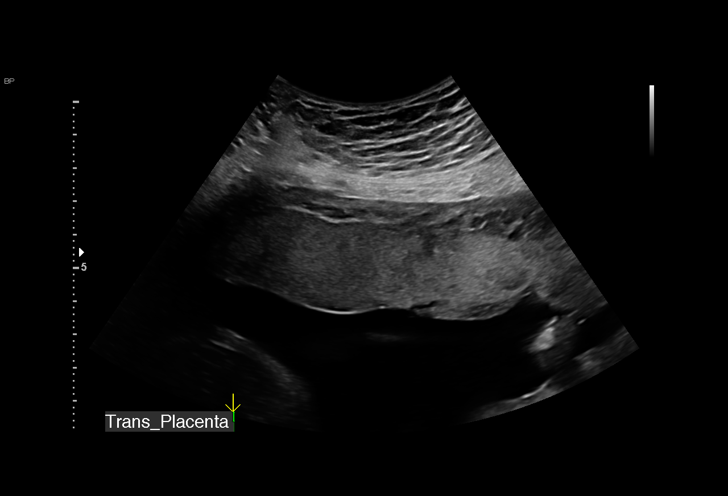
[im 9/74]
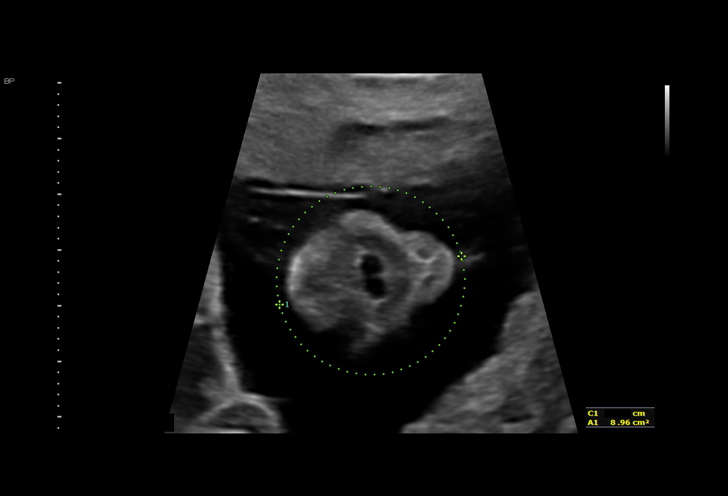
[im 14/74]
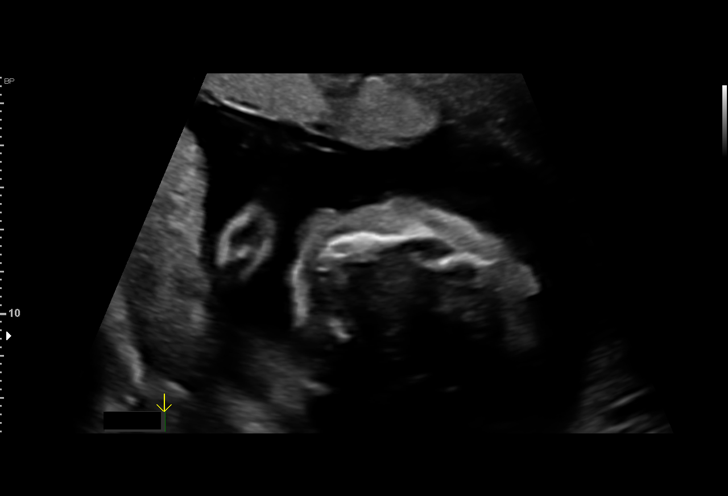
[im 19/74]
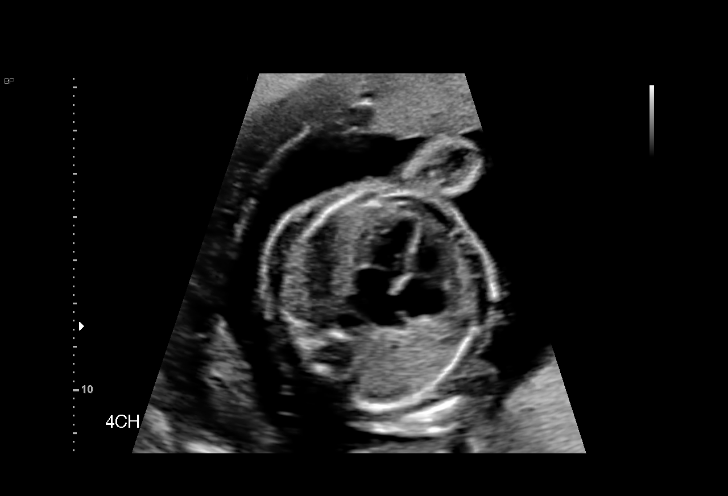
[im 25/74]
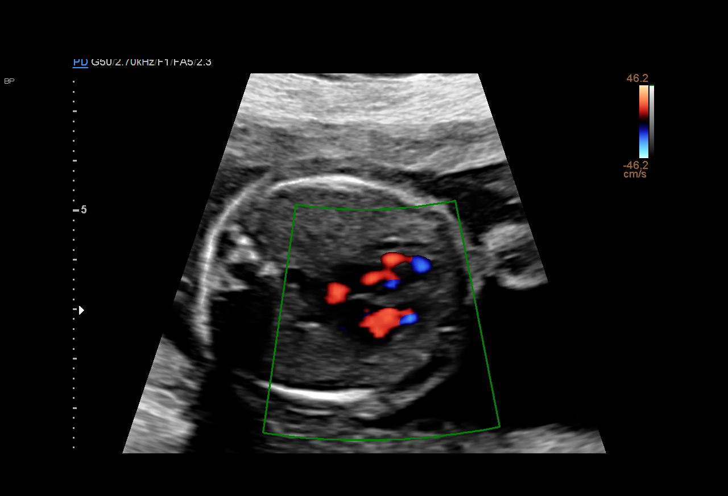
[im 30/74]
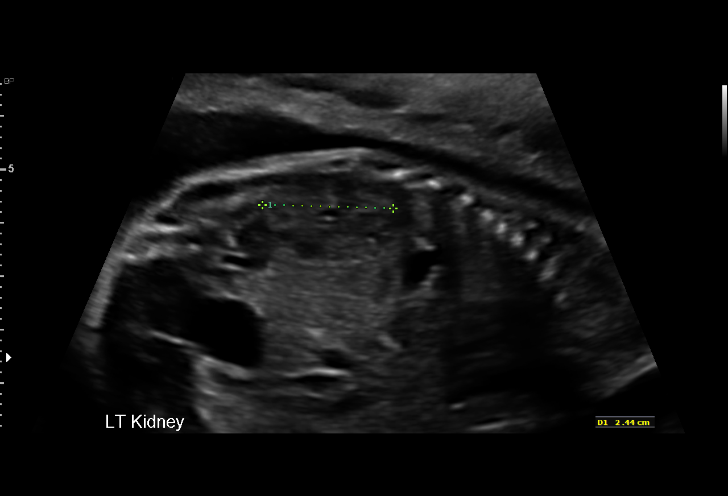
[im 38/74]
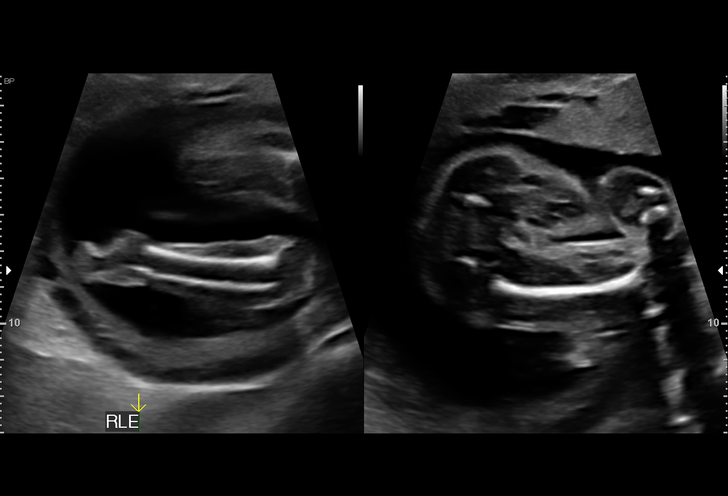
[im 44/74]
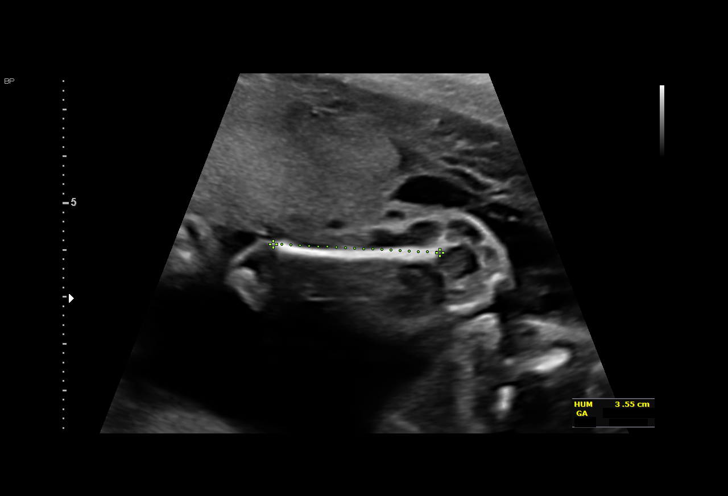
[im 49/74]
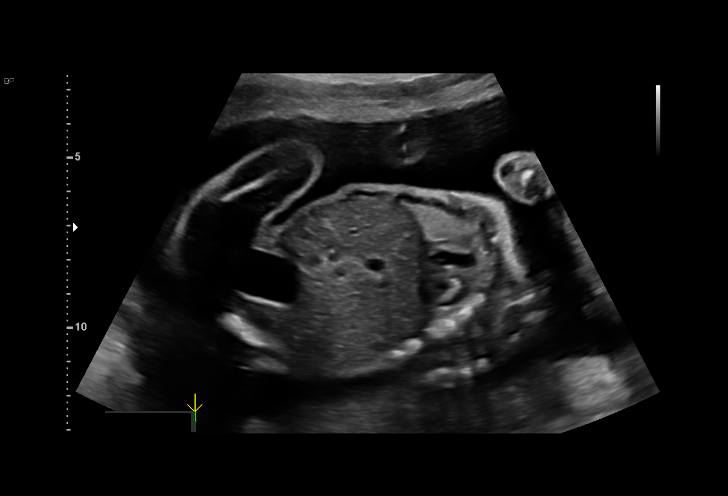
[im 55/74]
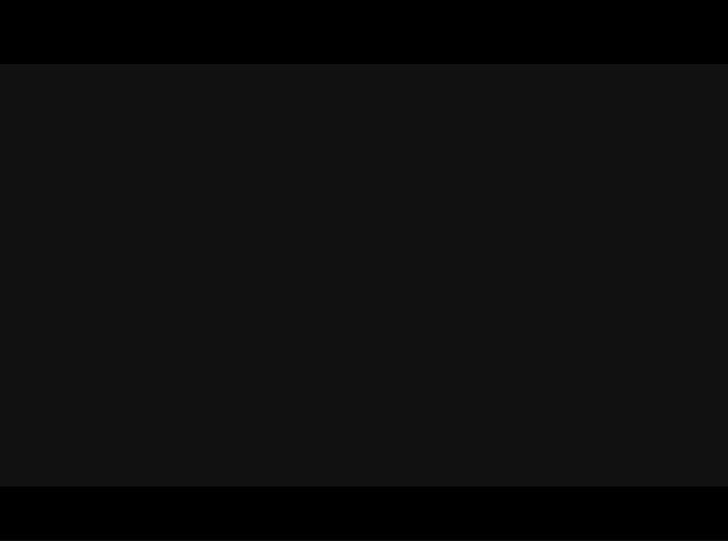
[im 60/74]
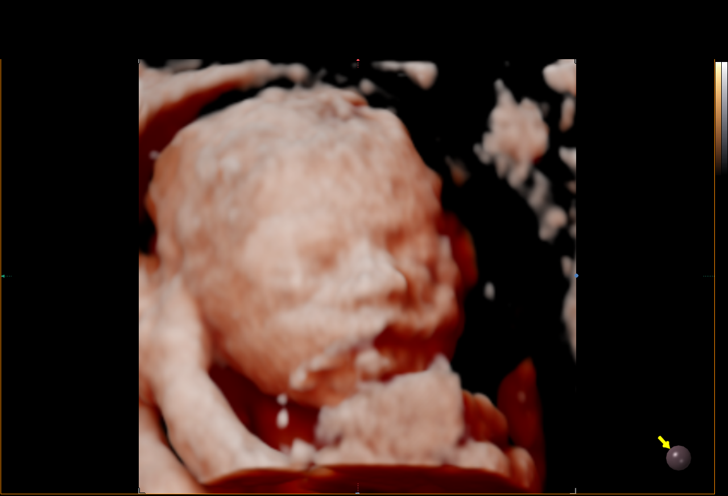
[im 65/74]
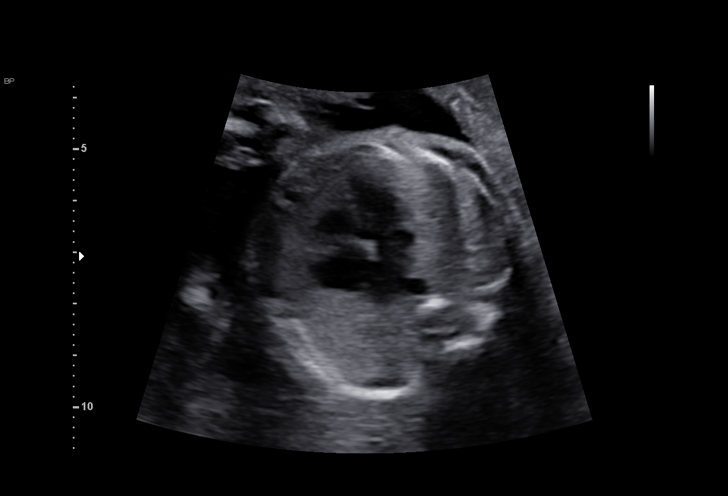
[im 71/74]
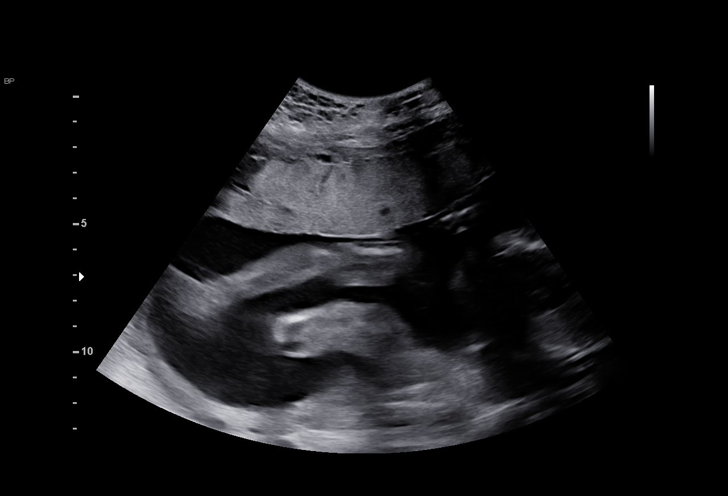

[13 of 28 positions shown; findings below may reference images not displayed]

AGUDELO

                   HAMOKI NP

Indications

 Advanced maternal age multigravida 35+,
 second trimester (89y.o)
 Marginal insertion of umbilical cord affecting
 management of mother in second trimester
 Poor obstetric history: Previous
 preeclampsia / eclampsia/gestational HTN
 LR NIPS/ Neg AFP, Horizon
 24 weeks gestation of pregnancy
Fetal Evaluation

 Num Of Fetuses:         1
 Fetal Heart Rate(bpm):  136
 Cardiac Activity:       Observed
 Presentation:           Cephalic
 Placenta:               Anterior
 P. Cord Insertion:      Marginal insertion prev vis

 Amniotic Fluid
 AFI FV:      Within normal limits

                             Largest Pocket(cm)

Biometry

 BPD:      55.5  mm     G. Age:  22w 6d         11  %    CI:        69.39   %    70 - 86
                                                         FL/HC:      18.7   %    18.7 -
 HC:      212.7  mm     G. Age:  23w 2d         12  %    HC/AC:      1.14        1.05 -
 AC:      186.1  mm     G. Age:  23w 3d         23  %    FL/BPD:     71.7   %    71 - 87
 FL:       39.8  mm     G. Age:  22w 6d          9  %    FL/AC:      21.4   %    20 - 24
 HUM:      35.5  mm     G. Age:  22w 2d        < 5  %

 Est. FW:     568  gm      1 lb 4 oz     12  %
OB History

 Gravidity:    4         Term:   3
 Living:       3
Gestational Age

 LMP:           24w 0d        Date:  06/19/21                 EDD:   03/26/22
 U/S Today:     23w 1d                                        EDD:   04/01/22
 Best:          24w 0d     Det. By:  LMP  (06/19/21)          EDD:   03/26/22
Anatomy

 Cranium:               Appears normal         Aortic Arch:            Previously seen
 Cavum:                 Appears normal         Ductal Arch:            Previously seen
 Ventricles:            Previously seen        Diaphragm:              Appears normal
 Choroid Plexus:        Previously seen        Stomach:                Appears normal, left
                                                                       sided
 Cerebellum:            Previously seen        Abdomen:                Previously seen
 Posterior Fossa:       Previously seen        Abdominal Wall:         Previously seen
 Nuchal Fold:           Previously seen        Cord Vessels:           Appears normal (3
                                                                       vessel cord)
 Face:                  Appears normal         Kidneys:                Appear normal
                        (orbits and profile)
 Lips:                  Appears normal         Bladder:                Appears normal
 Thoracic:              Appears normal         Spine:                  Previously seen
 Heart:                 Appears normal         Upper Extremities:      Appears normal
                        (4CH, axis, and
                        situs)
 RVOT:                  Appears normal         Lower Extremities:      Appears normal
 LVOT:                  Appears normal

 Other:  Fetus appears to be female. 3VV visualized. Heels visualized. Hands,
         lenses, nasal bone visualized.
Cervix Uterus Adnexa

 Cervix
 Length:           4.34  cm.
 Normal appearance by transabdominal scan.

 Uterus
 Normal shape and size.

 Right Ovary
 Not visualized.

 Left Ovary
 Not visualized.

 Cul De Sac
 No free fluid seen.
 Adnexa
 No adnexal mass visualized.
Comments

 This patient was seen for a follow up growth scan due to a
 marginal placental cord insertion and advanced maternal age
 (41 years old).  She denies any problems since her last exam.
 She was informed that the fetal growth and amniotic fluid
 level appears appropriate for her gestational age.  The
 percentile of the fetal growth continues to measure in the
 lower normal range (12%), similar to her last exam.
 Due to the marginal placental cord insertion, a follow-up
 growth scan was scheduled in 4 weeks.
 All conversations were held with the patient today with the
 help of a Spanish interpreter.

## 2022-08-02 IMAGING — US US MFM FETAL BPP W/O NON-STRESS
1 series · 12 of 28 positions shown · non-contrast
Comparison: none

[Series 1: us mfm fetal bpp w/o non-stress · 49 acquisitions, 12 frames shown]
[im 2/49]
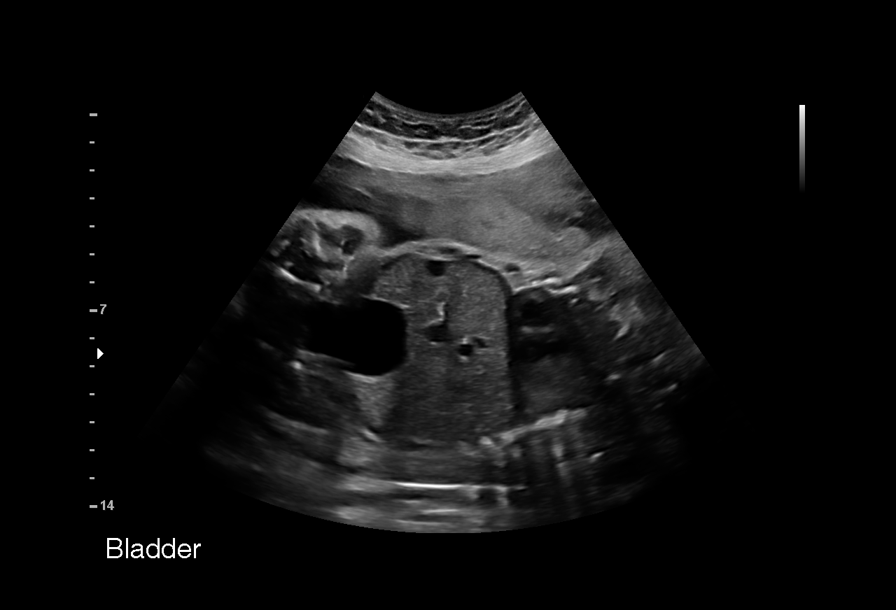
[im 6/49]
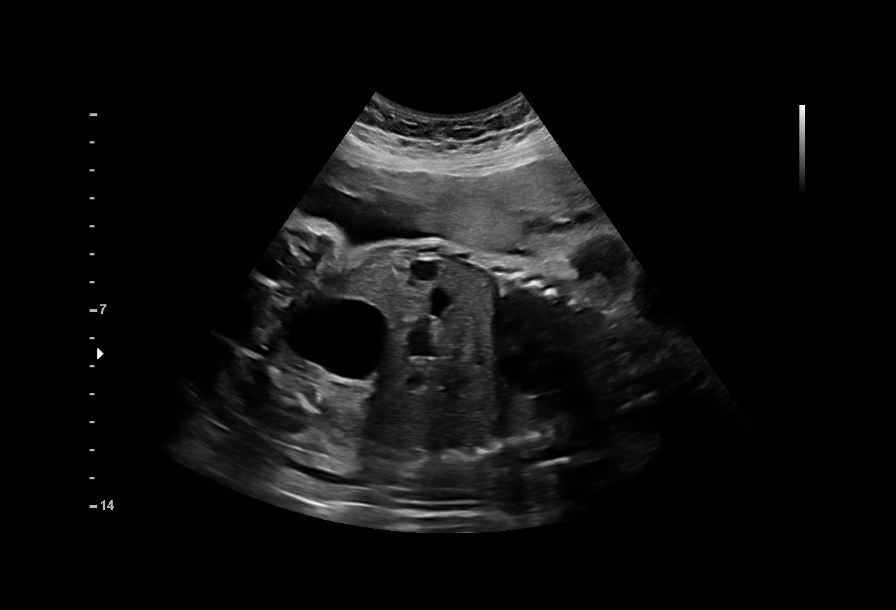
[im 9/49]
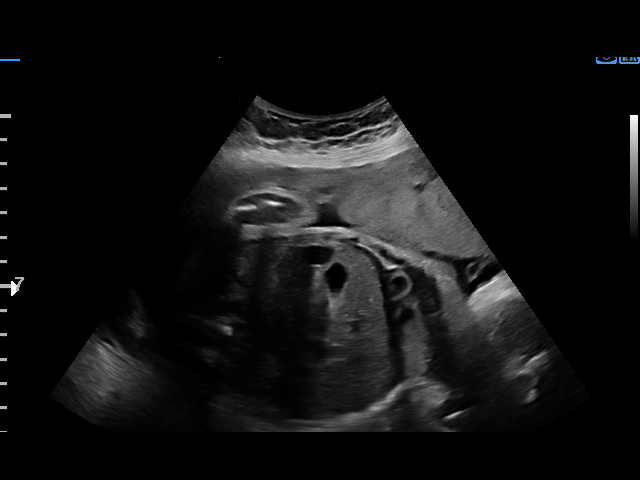
[im 15/49]
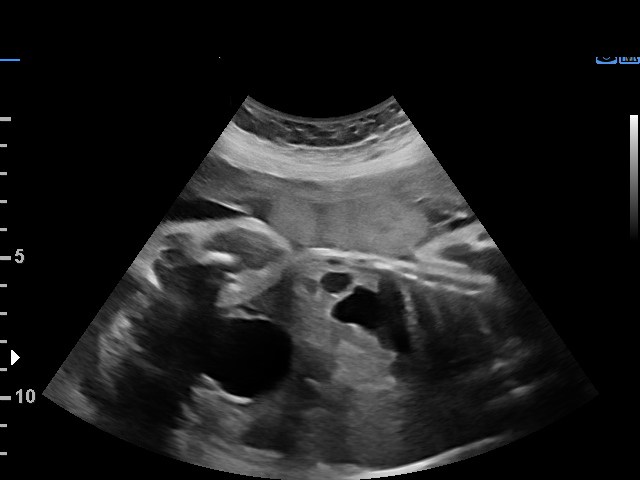
[im 18/49]
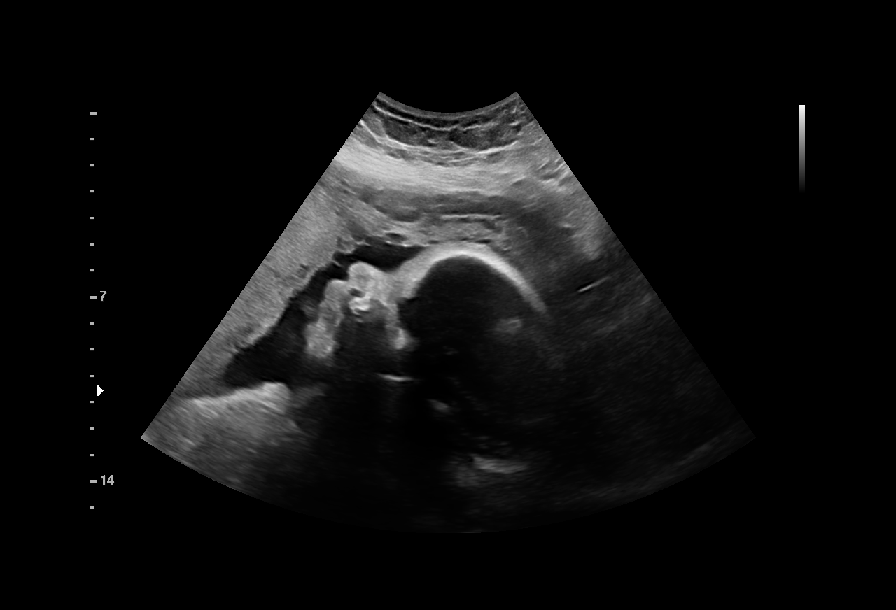
[im 22/49]
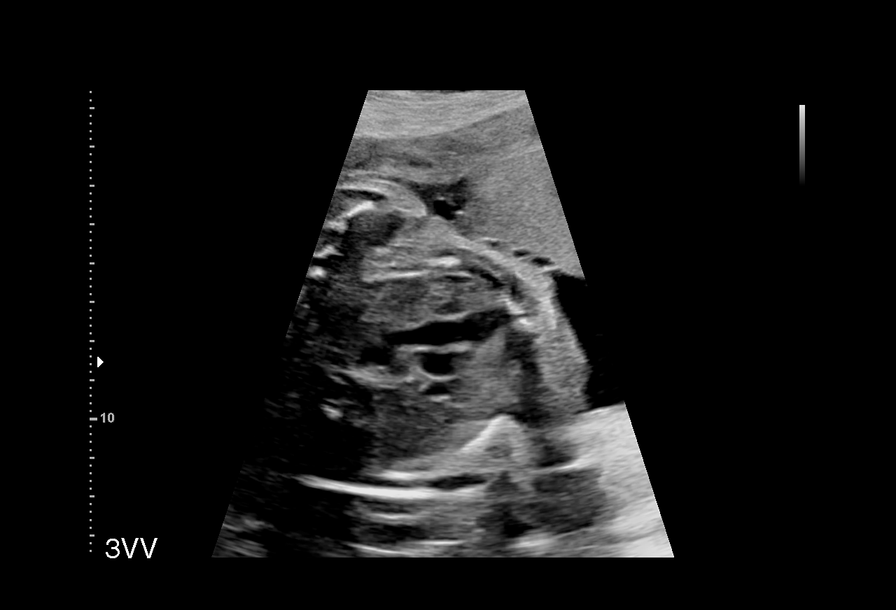
[im 27/49]
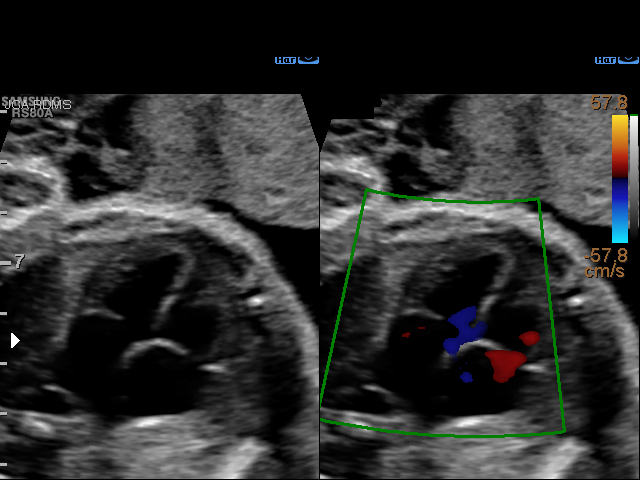
[im 31/49]
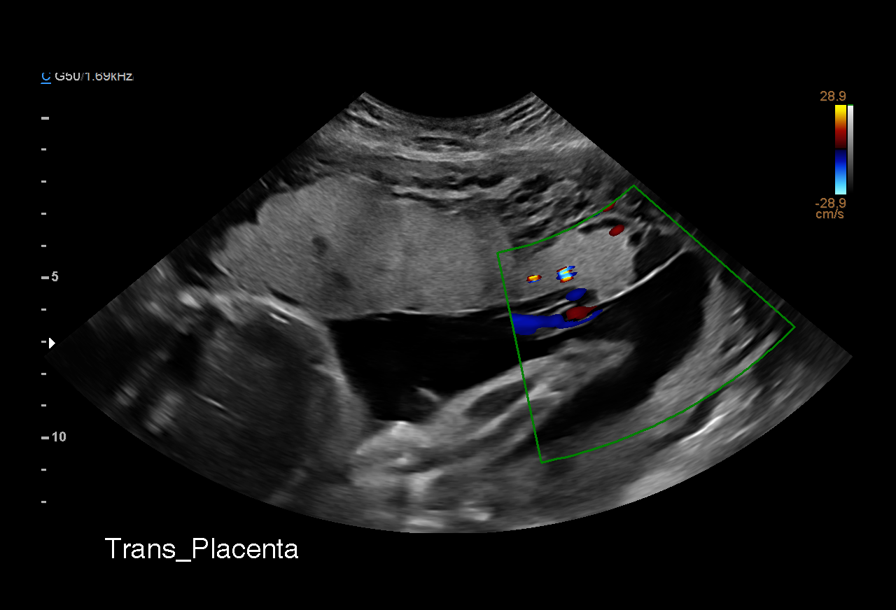
[im 34/49]
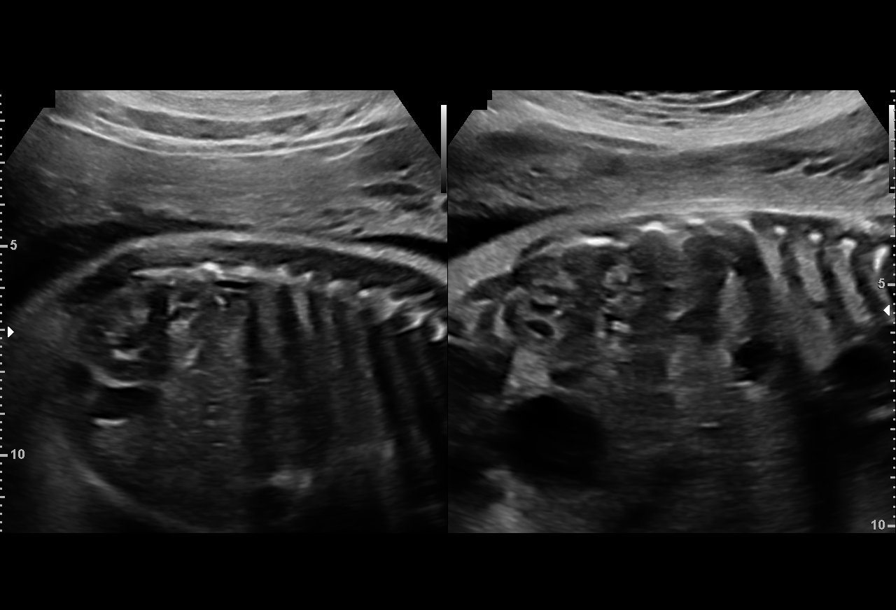
[im 40/49]
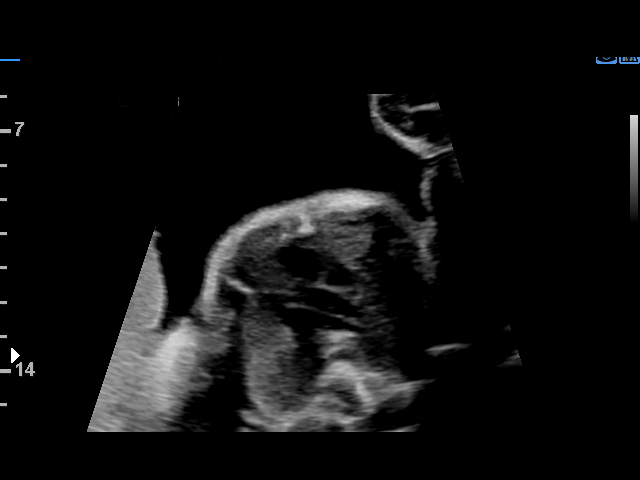
[im 43/49]
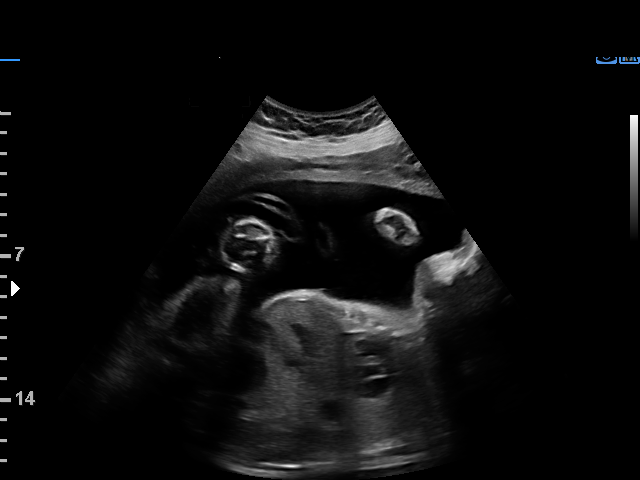
[im 47/49]
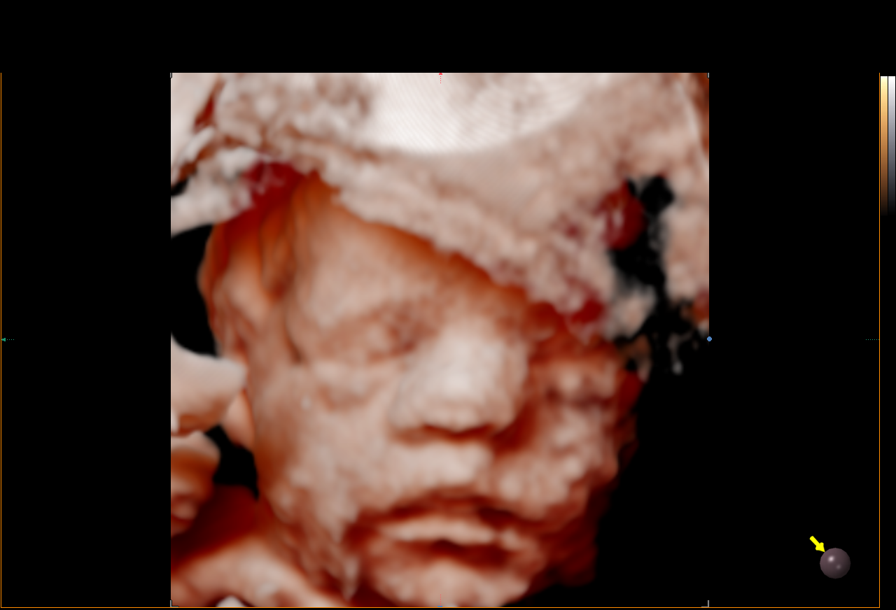

[12 of 28 positions shown; findings below may reference images not displayed]

DIX

 Attending:        Richei [place]      Secondary Phy.:   [facility]/Triage
                   [facility]

Indications

 Maternal care for known or suspected poor
 fetal growth, third trimester, not applicable or
 unspecified IUGR (Resolved)
 Advanced maternal age multigravida 35+,
 third trimester
 Marginal insertion of umbilical cord affecting
 management of mother in third trimester
 Poor obstetric history: Previous
 preeclampsia / eclampsia/gestational HTN
 32 weeks gestation of pregnancy
 LR NIPS/ Neg AFP, Horizon
Fetal Evaluation

 Num Of Fetuses:         1
 Fetal Heart Rate(bpm):  133
 Cardiac Activity:       Observed
 Presentation:           Cephalic
 Placenta:               Anterior
 P. Cord Insertion:      Previously Visualized

 Amniotic Fluid
 AFI FV:      Within normal limits
Biophysical Evaluation

 Amniotic F.V:   Within normal limits       F. Tone:        Observed
 F. Movement:    Observed                   Score:          [DATE]
 F. Breathing:   Observed
OB History

 Gravidity:    4         Term:   3
 Living:       3
Gestational Age

 LMP:           32w 0d        Date:  06/19/21                 EDD:   03/26/22
 Best:          32w 0d     Det. By:  LMP  (06/19/21)          EDD:   03/26/22
Impression

 Antenatal testing performed given maternal history of FGR
 but resolved.
 The biophysical profile was [DATE] with good fetal movement and
 amniotic fluid volume.
Recommendations

 Scheduled for growth in 2 weeks.

## 2022-08-05 ENCOUNTER — Other Ambulatory Visit: Payer: Self-pay | Admitting: *Deleted

## 2022-08-05 ENCOUNTER — Other Ambulatory Visit: Payer: Self-pay | Admitting: Obstetrics and Gynecology

## 2022-08-05 DIAGNOSIS — Z1231 Encounter for screening mammogram for malignant neoplasm of breast: Secondary | ICD-10-CM

## 2022-08-05 DIAGNOSIS — Z124 Encounter for screening for malignant neoplasm of cervix: Secondary | ICD-10-CM

## 2022-08-05 NOTE — Progress Notes (Signed)
Patient: Natalie Aguilar           Date of Birth: November 17, 1980           MRN: [id number] Visit Date: 08/05/2022 PCP: Natalie Pounds, NP  Cervical Cancer Screening Do you smoke?: No Have you ever had or been told you have an allergy to latex products?: No Marital status: Single Date of last pap smear: 1-2 yrs ago (10/20/20) Date of last menstrual period: 07/14/22 Number of pregnancies: 4 Number of births: 4 Have you ever had any of the following? Hysterectomy: No Tubal ligation (tubes tied): No Abnormal bleeding: No Abnormal pap smear: Yes (10/20/20 (LSIL, +HPV)) Venereal warts: No A sex partner with venereal warts: No A high risk* sex partner: No  Cervical Exam  Abnormal Observations: Patient complained of vaginal itching. No vaginal discharge observed on exam. Cervix reddened around os. Patient stated she has tried OTC Monistat with no change in symptoms. No redness observed on external genitalia. Patient stated she uses Gain laundry detergent and Dove soap. Recommended that she try a laundry detergent that is free and clear for sensitive skin. Recommendations: Last Pap smear was 10/20/2020 at [facility] [place] [facility] and was LSIL with positive HPV. Patient had a colposcopy to follow up 12/30/2020 that showed LGSIL. Per patient has history of one other abnormal Pap smear over 11 years ago that a colposcopy was completed for follow-up. Last Pap smear result is available in Epic. Let patient know if today's Pap smear is normal and HPV negative that her next Pap smear will be due in one year due to her history of an abnormal Pap smear. Informed patient will call her within the next couple of weeks with results of her Pap smear by phone. Patient verbalized understanding.    Spanish [name] from [facility] provided.  Patient's History Patient Active Problem List   Diagnosis Date Noted   Language barrier 10/22/2021   LGSIL on Pap smear of cervix  12/30/2020   Past Medical History:  Diagnosis Date   Depression    Irregular heart rate    Menorrhagia with irregular cycle 08/29/2020   Pregnancy induced hypertension    first preg   UTI (urinary tract infection)     Family History  Problem Relation Age of Onset   Hypertension Mother    Diabetes Mother    Thyroid disease Mother    Fibroids Mother    HIV/AIDS Father    Fibroids Sister    Colon cancer Neg Hx    Esophageal cancer Neg Hx    Rectal cancer Neg Hx    Stomach cancer Neg Hx     Social History   Occupational History   Not on file  Tobacco Use   Smoking status: Never   Smokeless tobacco: Never  Vaping Use   Vaping Use: Never used  Substance and Sexual Activity   Alcohol use: Not Currently    Comment: occasionally    Drug use: No   Sexual activity: Not Currently    Birth control/protection: None

## 2022-08-10 ENCOUNTER — Ambulatory Visit
Admission: RE | Admit: 2022-08-10 | Discharge: 2022-08-10 | Disposition: A | Discharge status: Home / Self Care | Payer: No Typology Code available for payment source | Source: Ambulatory Visit | Attending: Obstetrics and Gynecology | Admitting: Obstetrics and Gynecology

## 2022-08-10 DIAGNOSIS — Z1231 Encounter for screening mammogram for malignant neoplasm of breast: Secondary | ICD-10-CM

## 2022-08-10 LAB — CYTOLOGY - PAP
Comment: NEGATIVE
Comment: NEGATIVE
Comment: NEGATIVE
HPV 16: NEGATIVE
HPV 18 / 45: NEGATIVE
High risk HPV: POSITIVE — AB

## 2022-08-11 ENCOUNTER — Telehealth: Payer: Self-pay

## 2022-08-11 NOTE — Telephone Encounter (Signed)
Left message via [name] about pap results. Left name and number for patient to call back.

## 2022-08-12 ENCOUNTER — Telehealth: Payer: Self-pay

## 2022-08-12 NOTE — Telephone Encounter (Signed)
Called patient via Natalie Aguilar, [employer] to give pap smear results. Informed patient that pap smear showed LSIL, + HPV. Based on this result a colpo is recommended for FU. Patient is scheduled for [facility] and Colpo on 11/23/22 @ 1:00 pm. Patient voiced understanding.

## 2022-11-23 ENCOUNTER — Ambulatory Visit: Payer: Self-pay | Admitting: Hematology and Oncology

## 2022-11-23 ENCOUNTER — Other Ambulatory Visit (HOSPITAL_COMMUNITY)
Admission: RE | Admit: 2022-11-23 | Discharge: 2022-11-23 | Disposition: A | Discharge status: Home / Self Care | Payer: Self-pay | Source: Ambulatory Visit | Attending: Obstetrics and Gynecology | Admitting: Obstetrics and Gynecology

## 2022-11-23 VITALS — BP 110/72 | Wt 162.3 lb

## 2022-11-23 DIAGNOSIS — R87612 Low grade squamous intraepithelial lesion on cytologic smear of cervix (LGSIL): Secondary | ICD-10-CM | POA: Insufficient documentation

## 2022-11-23 DIAGNOSIS — Z01812 Encounter for preprocedural laboratory examination: Secondary | ICD-10-CM

## 2022-11-23 DIAGNOSIS — B977 Papillomavirus as the cause of diseases classified elsewhere: Secondary | ICD-10-CM

## 2022-11-23 LAB — PREGNANCY, URINE

## 2022-11-23 NOTE — Progress Notes (Signed)
[facility] Community Outreach   GYNECOLOGY CLINIC COLPOSCOPY PROCEDURE NOTE  Ms. Natalie Aguilar is a 42 y.o. [id number] here for colposcopy for low-grade squamous intraepithelial neoplasia (LGSIL - encompassing HPV,mild dysplasia,CIN I) pap smear on 08/05/22. Discussed role for HPV in cervical dysplasia, need for surveillance.  Patient given informed consent, signed copy in the chart, time out was performed.  Placed in lithotomy position. Cervix viewed with speculum and colposcope after application of acetic acid.   Colposcopy adequate? Yes  acetowhite lesion(s) noted at 7 and 5 o'clock; biopsies obtained.  ECC specimen obtained. All specimens were labelled and sent to pathology.  Patient was given post procedure instructions.  Will follow up pathology and manage accordingly.  Routine preventative health maintenance measures emphasized.   [name], NP 11/23/2022 1:52 PM

## 2022-11-24 NOTE — Progress Notes (Signed)
Ms. Natalie Aguilar is a 42 y.o. female who presents to [facility] clinic today with no complaints.    Pap Smear: Pap not smear completed today. Last Pap smear was 10/23 and was abnormal - LSIL/ HPV+ . Per patient has history of an abnormal Pap smear. Last Pap smear result is available in Epic.   Physical exam: Breasts Breasts symmetrical. No skin abnormalities bilateral breasts. No nipple retraction bilateral breasts. No nipple discharge bilateral breasts. No lymphadenopathy. No lumps palpated bilateral breasts.       Pelvic/Bimanual Pap is not indicated today    Smoking History: Patient has never smoked and was not referred to quit line.    Patient Navigation: Patient education provided. Access to services provided for patient through [place] interpreter provided. No transportation provided   Colorectal Cancer Screening: Per patient has never had colonoscopy completed No complaints today.    Breast and Cervical Cancer Risk Assessment: Patient does not have family history of breast cancer, known genetic mutations, or radiation treatment to the chest before age 68. Patient has  history of cervical dysplasia, immunocompromised, or DES exposure in-utero.  Risk Assessment   No risk assessment data for the current encounter  Risk Scores       12/09/2020   Last edited by: Demetrius Revel, LPN   5-year risk: 0.3 %   Lifetime risk: 5.8 %            A: [facility] exam without pap smear No complaints with benign breast exam. Colposcopy today for LSIL/ HPV+  P: Annual screening mammogram. Colposcopy today.  Natalie Ped, NP 11/24/2022 12:37 PM

## 2022-11-25 LAB — SURGICAL PATHOLOGY

## 2022-11-30 ENCOUNTER — Telehealth: Payer: Self-pay

## 2022-11-30 NOTE — Telephone Encounter (Signed)
-----   Message from [name], NP sent at 11/25/2022  3:43 PM EST ----- This patient will need repeat Pap smear next year. If continued LSIL, will refer for cryotherapy.   Thanks, Melissa ----- Message ----- From: Royston Bake, CMA Sent: 11/23/2022   1:30 PM EST To: [name], NP

## 2022-11-30 NOTE — Telephone Encounter (Signed)
Using [employer], [name], pt has been advised of her COLPO results and advised to repeat her PAP on one year.

## 2022-12-10 ENCOUNTER — Other Ambulatory Visit: Payer: Self-pay

## 2022-12-10 ENCOUNTER — Ambulatory Visit (HOSPITAL_COMMUNITY)
Admission: EM | Admit: 2022-12-10 | Discharge: 2022-12-10 | Disposition: A | Discharge status: Home / Self Care | Payer: No Typology Code available for payment source | Attending: Internal Medicine | Admitting: Internal Medicine

## 2022-12-10 ENCOUNTER — Encounter (HOSPITAL_COMMUNITY): Payer: Self-pay | Admitting: *Deleted

## 2022-12-10 DIAGNOSIS — N898 Other specified noninflammatory disorders of vagina: Secondary | ICD-10-CM

## 2022-12-10 DIAGNOSIS — N920 Excessive and frequent menstruation with regular cycle: Secondary | ICD-10-CM

## 2022-12-10 DIAGNOSIS — Z3202 Encounter for pregnancy test, result negative: Secondary | ICD-10-CM

## 2022-12-10 DIAGNOSIS — R0981 Nasal congestion: Secondary | ICD-10-CM

## 2022-12-10 DIAGNOSIS — B349 Viral infection, unspecified: Secondary | ICD-10-CM

## 2022-12-10 DIAGNOSIS — R519 Headache, unspecified: Secondary | ICD-10-CM

## 2022-12-10 DIAGNOSIS — Z1152 Encounter for screening for COVID-19: Secondary | ICD-10-CM

## 2022-12-10 LAB — BASIC METABOLIC PANEL
Anion gap: 8 (ref 5–15)
BUN: 11 mg/dL (ref 6–20)
CO2: 24 mmol/L (ref 22–32)
Calcium: 8.9 mg/dL (ref 8.9–10.3)
Chloride: 103 mmol/L (ref 98–111)
Creatinine, Ser: 0.65 mg/dL (ref 0.44–1.00)
GFR, Estimated: >60 mL/min (ref >60)
Glucose, Bld: 109 mg/dL — ABNORMAL HIGH (ref 70–99)
Potassium: 4.1 mmol/L (ref 3.5–5.1)
Sodium: 135 mmol/L (ref 135–145)

## 2022-12-10 LAB — CBC
HCT: 40.2 % (ref 36.0–46.0)
Hemoglobin: 12.7 g/dL (ref 12.0–15.0)
MCH: 24.7 pg — ABNORMAL LOW (ref 26.0–34.0)
MCHC: 31.6 g/dL (ref 30.0–36.0)
MCV: 78.1 fL — ABNORMAL LOW (ref 80.0–100.0)
Platelets: 260 10*3/uL (ref 150–400)
RBC: 5.15 MIL/uL — ABNORMAL HIGH (ref 3.87–5.11)
RDW: 13.3 % (ref 11.5–15.5)
WBC: 4.9 10*3/uL (ref 4.0–10.5)
nRBC: 0 % (ref 0.0–0.2)

## 2022-12-10 LAB — POC URINE PREG, ED: Preg Test, Ur: NEGATIVE

## 2022-12-10 MED ORDER — MEDROXYPROGESTERONE ACETATE 10 MG PO TABS
10.0000 mg | ORAL_TABLET | Freq: Every day | ORAL | 0 refills | Status: DC
  Filled 2022-12-10: qty 10, 10d supply, fill #0

## 2022-12-10 MED ORDER — GUAIFENESIN ER 1200 MG PO TB12
1200.0000 mg | ORAL_TABLET | Freq: Two times a day (BID) | ORAL | 0 refills | Status: DC
  Filled 2022-12-10: qty 14, 7d supply, fill #0

## 2022-12-10 MED ORDER — ACETAMINOPHEN 325 MG PO TABS
ORAL_TABLET | ORAL | Status: AC
  Filled 2022-12-10: qty 3

## 2022-12-10 MED ORDER — ACETAMINOPHEN 325 MG PO TABS
975.0000 mg | ORAL_TABLET | Freq: Once | ORAL | Status: AC
  Administered 2022-12-10: 975 mg via ORAL

## 2022-12-10 NOTE — ED Provider Notes (Signed)
[place]    CSN: [id number] Arrival date & time: 12/10/22  1407      History   Chief Complaint No chief complaint on file.   HPI Natalie Aguilar is a 42 y.o. female.   Patient presents to urgent care for evaluation of heavy menstrual bleeding that started 2 days ago. Menstrual cycles are regular and she does not use contraception. Changing pads/tampon every 2-3 hours, states this is heavier bleeding than her normal menstrual cycles. Menstrual bleeding has become slightly lighter since onset. This morning, she started to feel fatigued, dizzy with standing intermittently, and with generalized headache. No blurry vision, decreased visual acuity, tinnitus, fever/chills, urinary symptoms, flank pain, or lightheadedness. Reports bilateral lower abdominal discomfort described as sharp ache. Denies constipation, diarrhea, blood/mucous to the stools, and recent antibiotic or steroid use. Denies recent nausea or vomiting. She does report her son has been sick with GI illness and she has started to have some nasal congestion. No other viral URI symptoms reported, she is not coughing. No other known sick contacts with similar symptoms. Denies use of OTC medicines before coming to urgent care.     Past Medical History:  Diagnosis Date   Depression    Irregular heart rate    Menorrhagia with irregular cycle 08/29/2020   Pregnancy induced hypertension    first preg   UTI (urinary tract infection)     Patient Active Problem List   Diagnosis Date Noted   Language barrier 10/22/2021   LGSIL on Pap smear of cervix 12/30/2020    Past Surgical History:  Procedure Laterality Date   COLONOSCOPY  07/22/2020   NO PAST SURGERIES      OB History     Gravida  4   Para  4   Term  4   Preterm      AB      Living  4      SAB      IAB      Ectopic      Multiple  0   Live Births  4            Home Medications    Prior to Admission medications   Medication  Sig Start Date End Date Taking? Authorizing Provider  Guaifenesin 1200 MG TB12 Take 1 tablet (1,200 mg total) by mouth in the morning and at bedtime. 12/10/22  Yes Talbot Grumbling, FNP  medroxyPROGESTERone (PROVERA) 10 MG tablet Take 1 tablet (10 mg total) by mouth daily for 10 days. 12/10/22 12/20/22 Yes StanhopeStasia Cavalier, FNP  acetaminophen (TYLENOL) 325 MG tablet Take 2 tablets (650 mg total) by mouth every 4 (four) hours as needed (for pain scale < 4). Patient not taking: Reported on 11/23/2022 03/21/22   Janyth Pupa, DO  furosemide (LASIX) 20 MG tablet Take 1 tablet (20 mg total) by mouth once daily for 5 days. 03/22/22 03/28/22  Truett Mainland, DO  ibuprofen (ADVIL) 600 MG tablet Take 1 tablet (600 mg total) by mouth every 6 (six) hours as needed. Patient not taking: Reported on 11/23/2022 03/21/22   Janyth Pupa, DO  Prenatal Vit-Fe Fumarate-FA (MULTIVITAMIN-PRENATAL) 27-0.8 MG TABS tablet Take 1 tablet by mouth daily at 12 noon. Patient not taking: Reported on 11/23/2022    [provider]  cetirizine (ZYRTEC ALLERGY) 10 MG tablet Take 1 tablet (10 mg total) by mouth daily. 11/11/20 12/24/20  Jaynee Eagles, PA-C    Family History Family History  Problem Relation Age of  Onset   Hypertension Mother    Diabetes Mother    Thyroid disease Mother    Fibroids Mother    HIV/AIDS Father    Fibroids Sister    Colon cancer Neg Hx    Esophageal cancer Neg Hx    Rectal cancer Neg Hx    Stomach cancer Neg Hx    Breast cancer Neg Hx     Social History Social History   Tobacco Use   Smoking status: Never   Smokeless tobacco: Never  Vaping Use   Vaping Use: Never used  Substance Use Topics   Alcohol use: Not Currently    Comment: occasionally    Drug use: No     Allergies   Patient has no known allergies.   Review of Systems Review of Systems Per HPI  Physical Exam Triage Vital Signs ED Triage Vitals  Enc Vitals Group     BP 12/10/22 1445 126/88     Pulse Rate  12/10/22 1445 93     Resp 12/10/22 1445 18     Temp 12/10/22 1445 99 F (37.2 C)     Temp Source 12/10/22 1445 Oral     SpO2 12/10/22 1445 98 %     Weight --      Height --      Head Circumference --      Peak Flow --      Pain Score 12/10/22 1443 6     Pain Loc --      Pain Edu? --      Excl. in [place]? --    No data found.  Updated Vital Signs BP 126/88 (BP Location: Left Arm)   Pulse 93   Temp 99 F (37.2 C) (Oral)   Resp 18   LMP 12/08/2022 (Exact Date)   SpO2 98%   Visual Acuity Right Eye Distance:   Left Eye Distance:   Bilateral Distance:    Right Eye Near:   Left Eye Near:    Bilateral Near:     Physical Exam Vitals and nursing note reviewed.  Constitutional:      Appearance: She is not ill-appearing or toxic-appearing.  HENT:     Head: Normocephalic and atraumatic.     Right Ear: Hearing, tympanic membrane, ear canal and external ear normal.     Left Ear: Hearing, tympanic membrane, ear canal and external ear normal.     Nose: Nose normal.     Mouth/Throat:     Lips: Pink.     Mouth: Mucous membranes are moist. No injury.     Tongue: No lesions. Tongue does not deviate from midline.     Palate: No mass and lesions.     Pharynx: Oropharynx is clear. Uvula midline. No pharyngeal swelling, oropharyngeal exudate, posterior oropharyngeal erythema or uvula swelling.     Tonsils: No tonsillar exudate or tonsillar abscesses.  Eyes:     General: Lids are normal. Vision grossly intact. Gaze aligned appropriately.     Extraocular Movements: Extraocular movements intact.     Conjunctiva/sclera: Conjunctivae normal.  Cardiovascular:     Rate and Rhythm: Normal rate and regular rhythm.     Heart sounds: Normal heart sounds, S1 normal and S2 normal.  Pulmonary:     Effort: Pulmonary effort is normal. No respiratory distress.     Breath sounds: Normal breath sounds and air entry.  Abdominal:     General: Bowel sounds are normal.     Palpations: Abdomen is soft.  Tenderness: There is no abdominal tenderness. There is no right CVA tenderness, left CVA tenderness or guarding.  Musculoskeletal:     Cervical back: Neck supple.  Skin:    General: Skin is warm and dry.     Capillary Refill: Capillary refill takes less than 2 seconds.     Findings: No rash.  Neurological:     General: No focal deficit present.     Mental Status: She is alert and oriented to person, place, and time. Mental status is at baseline.     Cranial Nerves: Cranial nerves 2-12 are intact. No dysarthria or facial asymmetry.     Sensory: Sensation is intact.     Motor: Motor function is intact.     Coordination: Coordination is intact.     Gait: Gait is intact.     Comments: Strength and sensation intact to bilateral upper and lower extremities (5/5). Moves all 4 extremities with normal coordination voluntarily. Non-focal neuro exam.   Psychiatric:        Mood and Affect: Mood normal.        Speech: Speech normal.        Behavior: Behavior normal.        Thought Content: Thought content normal.        Judgment: Judgment normal.      UC Treatments / Results  Labs (all labs ordered are listed, but only abnormal results are displayed) Labs Reviewed  CBC - Abnormal; Notable for the following components:      Result Value   RBC 5.15 (*)    MCV 78.1 (*)    MCH 24.7 (*)    All other components within normal limits  BASIC METABOLIC PANEL - Abnormal; Notable for the following components:   Glucose, Bld 109 (*)    All other components within normal limits  SARS CORONAVIRUS 2 (TAT 6-24 HRS)  POC URINE PREG, ED  CERVICOVAGINAL ANCILLARY ONLY    EKG   Radiology No results found.  Procedures Procedures (including critical care time)  Medications Ordered in UC Medications  acetaminophen (TYLENOL) tablet 975 mg (975 mg Oral Given 12/10/22 1545)    Initial Impression / Assessment and Plan / UC Course  I have reviewed the triage vital signs and the nursing  notes.  Pertinent labs & imaging results that were available during my care of the patient were reviewed by me and considered in my medical decision making (see chart for details).   1. Menorrhagia with regular cycle, negative pregnancy test Urine pregnancy test is negative in clinic. Patient's dizziness and fatigue are concerning for possible symptomatic anemia, therefore basic blood work obtained today to rule out anemia and electrolyte imbalances. Will call patient if blood work is abnormal. Provera '10mg'$  QD for 10 days sent to pharmacy to slow down vaginal bleeding. Discussed potential side effects of this medication, she voices agreement with plan. Walking referral to OB/GYN given, advised to follow-up with OB/GYN for further evaluation of heavy menstrual bleeding. Patient is overall well-appearing with hemodynamically stable vital signs. She does not appear to be clinically dehydrated. Strict ER and urgent care return precautions discussed.  2. Bad headache, nasal congestion, COVID-19 screening COVID-19 testing is pending, suspect viral syndrome contributing to nasal congestion and headache. Tylenol '975mg'$  given in clinic. Headache also likely related to mild dehydration. She is neurologically intact to her baseline with non focal exam. Guaifenesin sent to pharmacy to be taken as needed for congestion. Lungs are clear, therefore deferred imaging. . Will  call with COVID results if positive.   3. Vaginal itching STI labs pending.  Patient declines HIV and syphilis testing today.  Will notify patient of positive results and treat accordingly when labs come back.  Patient to avoid sexual intercourse until screening testing comes back.   Discussed physical exam and available lab work findings in clinic with patient.  Counseled patient regarding appropriate use of medications and potential side effects for all medications recommended or prescribed today. Discussed red flag signs and symptoms of worsening  condition,when to call the PCP office, return to urgent care, and when to seek higher level of care in the emergency department. Patient verbalizes understanding and agreement with plan. All questions answered. Patient discharged in stable condition.    Final Clinical Impressions(s) / UC Diagnoses   Final diagnoses:  Negative pregnancy test  Nasal congestion  Encounter for screening for COVID-19  Bad headache  Menorrhagia with regular cycle     Discharge Instructions      Your urine pregnancy test is negative. Please take Provera 10 mg once daily for the next 10 days to slow down vaginal bleeding. This will help to slow down the vaginal bleeding and may even stop it, however the vaginal bleeding may return after taking this medication but will be much less severe.  I have drawn basic blood work today to check for electrolyte imbalance and anemia.  I will call you if this is abnormal.   I have tested you for COVID today and this will come back in the next 24 hours.  I will call you if this is positive.  You may take guaifenesin as needed for nasal congestion. You may continue taking Tylenol 1000 mg every 6 hours as needed for headache.   Please schedule another appointment with your OB/GYN for follow-up evaluation regarding your heavy vaginal bleeding.  If you develop any new or worsening symptoms or do not improve in the next 2 to 3 days, please return.  If your symptoms are severe, please go to the emergency room.  Follow-up with your primary care provider for further evaluation and management of your symptoms as well as ongoing wellness visits.  I hope you feel better!     ED Prescriptions     Medication Sig Dispense Auth. Provider   medroxyPROGESTERone (PROVERA) 10 MG tablet Take 1 tablet (10 mg total) by mouth daily for 10 days. 10 tablet Talbot Grumbling, FNP   Guaifenesin 1200 MG TB12 Take 1 tablet (1,200 mg total) by mouth in the morning and at bedtime. 14 tablet  Talbot Grumbling, FNP      PDMP not reviewed this encounter.   Talbot Grumbling, Vivian 12/13/22 2049

## 2022-12-10 NOTE — ED Triage Notes (Signed)
Pt states that she started her menstrual cycle Wednesday and since then she has started to get weak, dizzy and have a headache. She states her cycle hasn't ever been this strong before. She isn't taking any medication.    She is having some congestion which started yesterday.

## 2022-12-10 NOTE — Discharge Instructions (Addendum)
Your urine pregnancy test is negative. Please take Provera 10 mg once daily for the next 10 days to slow down vaginal bleeding. This will help to slow down the vaginal bleeding and may even stop it, however the vaginal bleeding may return after taking this medication but will be much less severe.  I have drawn basic blood work today to check for electrolyte imbalance and anemia.  I will call you if this is abnormal.   I have tested you for COVID today and this will come back in the next 24 hours.  I will call you if this is positive.  You may take guaifenesin as needed for nasal congestion. You may continue taking Tylenol 1000 mg every 6 hours as needed for headache.   Please schedule another appointment with your OB/GYN for follow-up evaluation regarding your heavy vaginal bleeding.  If you develop any new or worsening symptoms or do not improve in the next 2 to 3 days, please return.  If your symptoms are severe, please go to the emergency room.  Follow-up with your primary care provider for further evaluation and management of your symptoms as well as ongoing wellness visits.  I hope you feel better!

## 2022-12-11 LAB — SARS CORONAVIRUS 2 (TAT 6-24 HRS): SARS Coronavirus 2: NEGATIVE

## 2022-12-13 LAB — CERVICOVAGINAL ANCILLARY ONLY
Bacterial Vaginitis (gardnerella): NEGATIVE
Candida Glabrata: NEGATIVE
Candida Vaginitis: NEGATIVE
Chlamydia: NEGATIVE
Comment: NEGATIVE
Comment: NEGATIVE
Comment: NEGATIVE
Comment: NEGATIVE
Comment: NEGATIVE
Comment: NORMAL
Neisseria Gonorrhea: NEGATIVE
Trichomonas: NEGATIVE

## 2023-07-06 ENCOUNTER — Other Ambulatory Visit: Payer: Self-pay

## 2023-07-06 DIAGNOSIS — Z1231 Encounter for screening mammogram for malignant neoplasm of breast: Secondary | ICD-10-CM

## 2023-08-18 ENCOUNTER — Ambulatory Visit
Admission: RE | Admit: 2023-08-18 | Discharge: 2023-08-18 | Disposition: A | Discharge status: Home / Self Care | Payer: No Typology Code available for payment source | Source: Ambulatory Visit | Attending: Nurse Practitioner | Admitting: Nurse Practitioner

## 2023-08-18 DIAGNOSIS — Z1231 Encounter for screening mammogram for malignant neoplasm of breast: Secondary | ICD-10-CM

## 2023-12-06 ENCOUNTER — Other Ambulatory Visit: Payer: Self-pay | Admitting: *Deleted

## 2023-12-06 ENCOUNTER — Ambulatory Visit: Payer: No Typology Code available for payment source

## 2023-12-06 DIAGNOSIS — Z124 Encounter for screening for malignant neoplasm of cervix: Secondary | ICD-10-CM

## 2023-12-06 NOTE — Progress Notes (Signed)
 Patient: Natalie Aguilar           Date of Birth: 05/11/1981           MRN: [id number] Visit Date: 12/06/2023 PCP: Natalie Rigg, NP  Cervical Cancer Screening Do you smoke?: No Have you ever had or been told you have an allergy to latex products?: No Marital status: Single Date of last pap smear: 1-2 yrs ago Date of last menstrual period: 11/08/23 Number of pregnancies: 4 Number of births: 4 Have you ever had any of the following? Hysterectomy: No Tubal ligation (tubes tied): No Abnormal bleeding: No Abnormal pap smear: Yes Venereal warts: No A sex partner with venereal warts: No A high risk* sex partner: No  Cervical Exam  Abnormal Observations: Normal Exam Recommendations: Last Pap smear was 08/05/2022 at free cervical cancer screening and was LSIL with positive HPV. 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 Per patient has a history of two other abnormal Pap smears 10/20/2020 at [facility] and was LSIL with positive HPV that had a colposcopy to follow up 12/30/2020 that showed LGSIL and over 12 years ago that a colposcopy was completed for follow-up. Last two Pap smear results are available in Epic. Let patient know if today's Pap smear is normal and HPV negative that her next Pap smear will be due in one year due to her history of an abnormal Pap smear. Informed patient that will follow up with her with results of Pap smear within the next couple of weeks by letter or phone. Patient verbalized understanding.    Spanish [name] from [facility] provided.  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   Past Medical History:  Diagnosis Date   Depression    Irregular heart rate    Menorrhagia with irregular cycle 08/29/2020   Pregnancy induced hypertension    first preg   UTI (urinary tract  infection)     Family History  Problem Relation Age of Onset   Hypertension Mother    Diabetes Mother    Thyroid disease Mother    Fibroids Mother    HIV/AIDS Father    Fibroids Sister    Colon cancer Neg Hx    Esophageal cancer Neg Hx    Rectal cancer Neg Hx    Stomach cancer Neg Hx    Breast cancer Neg Hx     Social History   Occupational History   Not on file  Tobacco Use   Smoking status: Never   Smokeless tobacco: Never  Vaping Use   Vaping status: Never Used  Substance and Sexual Activity   Alcohol use: Not Currently    Comment: occasionally    Drug use: No   Sexual activity: Yes    Birth control/protection: None

## 2023-12-06 NOTE — Patient Instructions (Signed)
 Marland Kitchen

## 2023-12-09 LAB — CYTOLOGY - PAP
Comment: NEGATIVE
Comment: NEGATIVE
Comment: NEGATIVE
Diagnosis: NEGATIVE
Diagnosis: REACTIVE
HPV 16: NEGATIVE
HPV 18 / 45: NEGATIVE
High risk HPV: POSITIVE — AB

## 2024-01-05 ENCOUNTER — Telehealth: Payer: Self-pay

## 2024-01-05 NOTE — Telephone Encounter (Signed)
 I have spoken with the pt with a Spanish interpreter and advised of her PAP results and the need for another COLPO. Pt expressed understanding of this information and was advised to disregard the letter that was sent to her.  Pt has been scheduled for 01/17/24 at 10:45a.

## 2024-01-17 ENCOUNTER — Other Ambulatory Visit (HOSPITAL_COMMUNITY)
Admission: RE | Admit: 2024-01-17 | Discharge: 2024-01-17 | Disposition: A | Discharge status: Home / Self Care | Source: Ambulatory Visit | Attending: Obstetrics and Gynecology | Admitting: Obstetrics and Gynecology

## 2024-01-17 ENCOUNTER — Ambulatory Visit: Payer: Self-pay | Admitting: Hematology and Oncology

## 2024-01-17 VITALS — BP 114/65 | Wt 167.0 lb

## 2024-01-17 DIAGNOSIS — B977 Papillomavirus as the cause of diseases classified elsewhere: Secondary | ICD-10-CM

## 2024-01-17 DIAGNOSIS — Z01812 Encounter for preprocedural laboratory examination: Secondary | ICD-10-CM

## 2024-01-17 NOTE — Progress Notes (Signed)
 [facility]   GYNECOLOGY CLINIC COLPOSCOPY PROCEDURE NOTE  Ms. Natalie Aguilar is a 43 y.o. [id number] here for colposcopy for  Normal/ HPV+  pap smear on 12/06/2023. Discussed role for HPV in cervical dysplasia, need for surveillance.  Patient given informed consent, signed copy in the chart, time out was performed.  Placed in lithotomy position. Cervix viewed with speculum and colposcope after application of acetic acid.   Colposcopy adequate? Yes  no visible lesions.  ECC specimen obtained. All specimens were labelled and sent to pathology.  Patient was given post procedure instructions.  Will follow up pathology and manage accordingly.  Routine preventative health maintenance measures emphasized.   Ilda Basset A, NP 01/17/2024 11:15 AM

## 2024-01-19 ENCOUNTER — Telehealth: Payer: Self-pay

## 2024-01-19 LAB — SURGICAL PATHOLOGY

## 2024-01-19 NOTE — Telephone Encounter (Signed)
 Using Spanish interpreter, Natalie Aguilar, I have spoken with the pt regarding her COLPO results and advised her to repeat her PAP in one year. All questions were answered. Pt voiced understanding.

## 2024-01-19 NOTE — Telephone Encounter (Signed)
-----   Message from Pascal Lux sent at 01/19/2024 12:14 PM EDT ----- Repeat Pap smear in one year. Thanks.

## 2024-03-01 ENCOUNTER — Encounter (INDEPENDENT_AMBULATORY_CARE_PROVIDER_SITE_OTHER): Admitting: Primary Care

## 2024-03-15 ENCOUNTER — Ambulatory Visit (INDEPENDENT_AMBULATORY_CARE_PROVIDER_SITE_OTHER): Admitting: Primary Care

## 2024-07-03 ENCOUNTER — Other Ambulatory Visit: Payer: Self-pay

## 2024-07-03 DIAGNOSIS — Z1231 Encounter for screening mammogram for malignant neoplasm of breast: Secondary | ICD-10-CM

## 2024-08-23 ENCOUNTER — Ambulatory Visit
Admission: RE | Admit: 2024-08-23 | Discharge: 2024-08-23 | Disposition: A | Source: Ambulatory Visit | Attending: Nurse Practitioner

## 2024-08-23 DIAGNOSIS — Z1231 Encounter for screening mammogram for malignant neoplasm of breast: Secondary | ICD-10-CM

## 2024-08-28 ENCOUNTER — Other Ambulatory Visit: Payer: Self-pay | Admitting: Obstetrics and Gynecology

## 2024-08-28 DIAGNOSIS — R928 Other abnormal and inconclusive findings on diagnostic imaging of breast: Secondary | ICD-10-CM

## 2024-09-11 ENCOUNTER — Ambulatory Visit

## 2024-09-11 ENCOUNTER — Ambulatory Visit
Admission: RE | Admit: 2024-09-11 | Discharge: 2024-09-11 | Disposition: A | Source: Ambulatory Visit | Attending: Obstetrics and Gynecology | Admitting: Obstetrics and Gynecology

## 2024-09-11 DIAGNOSIS — R928 Other abnormal and inconclusive findings on diagnostic imaging of breast: Secondary | ICD-10-CM
